# Patient Record
Sex: Female | Born: 1945
Health system: Southern US, Community
[De-identification: ages and names within clinical notes are randomized; demographics above are authoritative.]

## PROBLEM LIST (undated history)

## (undated) DIAGNOSIS — K219 Gastro-esophageal reflux disease without esophagitis: Secondary | ICD-10-CM

## (undated) DIAGNOSIS — K5792 Diverticulitis of intestine, part unspecified, without perforation or abscess without bleeding: Secondary | ICD-10-CM

## (undated) DIAGNOSIS — M1990 Unspecified osteoarthritis, unspecified site: Secondary | ICD-10-CM

## (undated) DIAGNOSIS — R51 Headache: Secondary | ICD-10-CM

## (undated) DIAGNOSIS — E785 Hyperlipidemia, unspecified: Secondary | ICD-10-CM

## (undated) DIAGNOSIS — H35373 Puckering of macula, bilateral: Secondary | ICD-10-CM

## (undated) DIAGNOSIS — E039 Hypothyroidism, unspecified: Secondary | ICD-10-CM

## (undated) DIAGNOSIS — N841 Polyp of cervix uteri: Secondary | ICD-10-CM

## (undated) DIAGNOSIS — G43909 Migraine, unspecified, not intractable, without status migrainosus: Secondary | ICD-10-CM

## (undated) DIAGNOSIS — R519 Headache, unspecified: Secondary | ICD-10-CM

## (undated) DIAGNOSIS — G510 Bell's palsy: Secondary | ICD-10-CM

## (undated) DIAGNOSIS — N390 Urinary tract infection, site not specified: Secondary | ICD-10-CM

## (undated) HISTORY — DX: Bell's palsy: G51.0

## (undated) HISTORY — DX: Headache: R51

## (undated) HISTORY — PX: DILATION AND CURETTAGE OF UTERUS: SHX78

## (undated) HISTORY — DX: Puckering of macula, bilateral: H35.373

## (undated) HISTORY — DX: Gastro-esophageal reflux disease without esophagitis: K21.9

## (undated) HISTORY — DX: Hypothyroidism, unspecified: E03.9

## (undated) HISTORY — DX: Headache, unspecified: R51.9

## (undated) HISTORY — DX: Urinary tract infection, site not specified: N39.0

## (undated) HISTORY — PX: BLEPHAROPLASTY: SUR158

## (undated) HISTORY — PX: UPPER ENDOSCOPY W/ ANTRODUODENAL MANOMETRY: SHX2602

## (undated) HISTORY — DX: Unspecified osteoarthritis, unspecified site: M19.90

## (undated) HISTORY — PX: COLONOSCOPY: SHX174

## (undated) HISTORY — DX: Migraine, unspecified, not intractable, without status migrainosus: G43.909

## (undated) HISTORY — DX: Polyp of cervix uteri: N84.1

## (undated) HISTORY — DX: Hyperlipidemia, unspecified: E78.5

## (undated) HISTORY — DX: Diverticulitis of intestine, part unspecified, without perforation or abscess without bleeding: K57.92

---

## 1993-05-19 HISTORY — PX: BREAST EXCISIONAL BIOPSY: SUR124

## 1993-09-18 HISTORY — PX: BREAST EXCISIONAL BIOPSY: SUR124

## 2006-10-19 HISTORY — PX: CATARACT EXTRACTION: SUR2

## 2012-10-19 HISTORY — PX: CERVICAL POLYPECTOMY: SHX88

## 2012-10-19 HISTORY — PX: HYSTEROSCOPY: SHX211

## 2014-03-08 LAB — HM DEXA SCAN

## 2015-10-22 DIAGNOSIS — Z1159 Encounter for screening for other viral diseases: Secondary | ICD-10-CM | POA: Diagnosis not present

## 2015-10-22 DIAGNOSIS — I10 Essential (primary) hypertension: Secondary | ICD-10-CM | POA: Diagnosis not present

## 2015-10-29 DIAGNOSIS — Z23 Encounter for immunization: Secondary | ICD-10-CM | POA: Diagnosis not present

## 2015-10-29 DIAGNOSIS — Z0000 Encounter for general adult medical examination without abnormal findings: Secondary | ICD-10-CM | POA: Diagnosis not present

## 2015-11-06 DIAGNOSIS — H04123 Dry eye syndrome of bilateral lacrimal glands: Secondary | ICD-10-CM | POA: Diagnosis not present

## 2015-11-06 DIAGNOSIS — H43812 Vitreous degeneration, left eye: Secondary | ICD-10-CM | POA: Diagnosis not present

## 2015-11-06 DIAGNOSIS — H35352 Cystoid macular degeneration, left eye: Secondary | ICD-10-CM | POA: Diagnosis not present

## 2015-11-06 DIAGNOSIS — H35372 Puckering of macula, left eye: Secondary | ICD-10-CM | POA: Diagnosis not present

## 2015-12-02 DIAGNOSIS — K219 Gastro-esophageal reflux disease without esophagitis: Secondary | ICD-10-CM | POA: Diagnosis not present

## 2015-12-03 DIAGNOSIS — L570 Actinic keratosis: Secondary | ICD-10-CM | POA: Diagnosis not present

## 2015-12-03 DIAGNOSIS — L820 Inflamed seborrheic keratosis: Secondary | ICD-10-CM | POA: Diagnosis not present

## 2016-01-03 DIAGNOSIS — H35413 Lattice degeneration of retina, bilateral: Secondary | ICD-10-CM | POA: Diagnosis not present

## 2016-01-03 DIAGNOSIS — H35411 Lattice degeneration of retina, right eye: Secondary | ICD-10-CM | POA: Diagnosis not present

## 2016-01-03 DIAGNOSIS — H43811 Vitreous degeneration, right eye: Secondary | ICD-10-CM | POA: Diagnosis not present

## 2016-01-03 DIAGNOSIS — H43822 Vitreomacular adhesion, left eye: Secondary | ICD-10-CM | POA: Diagnosis not present

## 2016-01-03 DIAGNOSIS — H43812 Vitreous degeneration, left eye: Secondary | ICD-10-CM | POA: Diagnosis not present

## 2016-01-03 DIAGNOSIS — H43813 Vitreous degeneration, bilateral: Secondary | ICD-10-CM | POA: Diagnosis not present

## 2016-01-03 DIAGNOSIS — H35412 Lattice degeneration of retina, left eye: Secondary | ICD-10-CM | POA: Diagnosis not present

## 2016-02-21 DIAGNOSIS — Z1151 Encounter for screening for human papillomavirus (HPV): Secondary | ICD-10-CM | POA: Diagnosis not present

## 2016-02-21 DIAGNOSIS — Z124 Encounter for screening for malignant neoplasm of cervix: Secondary | ICD-10-CM | POA: Diagnosis not present

## 2016-02-21 LAB — HM PAP SMEAR: HM Pap smear: NORMAL

## 2016-03-05 DIAGNOSIS — L570 Actinic keratosis: Secondary | ICD-10-CM | POA: Diagnosis not present

## 2016-03-05 DIAGNOSIS — L821 Other seborrheic keratosis: Secondary | ICD-10-CM | POA: Diagnosis not present

## 2016-03-05 DIAGNOSIS — L853 Xerosis cutis: Secondary | ICD-10-CM | POA: Diagnosis not present

## 2016-03-05 DIAGNOSIS — D225 Melanocytic nevi of trunk: Secondary | ICD-10-CM | POA: Diagnosis not present

## 2016-03-05 DIAGNOSIS — D235 Other benign neoplasm of skin of trunk: Secondary | ICD-10-CM | POA: Diagnosis not present

## 2016-04-07 DIAGNOSIS — Z1231 Encounter for screening mammogram for malignant neoplasm of breast: Secondary | ICD-10-CM | POA: Diagnosis not present

## 2016-08-27 DIAGNOSIS — Z23 Encounter for immunization: Secondary | ICD-10-CM | POA: Diagnosis not present

## 2016-09-18 DIAGNOSIS — Z961 Presence of intraocular lens: Secondary | ICD-10-CM | POA: Diagnosis not present

## 2016-09-18 DIAGNOSIS — H35413 Lattice degeneration of retina, bilateral: Secondary | ICD-10-CM | POA: Diagnosis not present

## 2016-09-18 DIAGNOSIS — H04123 Dry eye syndrome of bilateral lacrimal glands: Secondary | ICD-10-CM | POA: Diagnosis not present

## 2016-09-18 DIAGNOSIS — H35372 Puckering of macula, left eye: Secondary | ICD-10-CM | POA: Diagnosis not present

## 2016-10-23 DIAGNOSIS — I10 Essential (primary) hypertension: Secondary | ICD-10-CM | POA: Diagnosis not present

## 2016-10-30 DIAGNOSIS — Z0000 Encounter for general adult medical examination without abnormal findings: Secondary | ICD-10-CM | POA: Diagnosis not present

## 2016-12-25 DIAGNOSIS — L02211 Cutaneous abscess of abdominal wall: Secondary | ICD-10-CM | POA: Diagnosis not present

## 2017-01-05 DIAGNOSIS — H43813 Vitreous degeneration, bilateral: Secondary | ICD-10-CM | POA: Diagnosis not present

## 2017-01-05 DIAGNOSIS — H35412 Lattice degeneration of retina, left eye: Secondary | ICD-10-CM | POA: Diagnosis not present

## 2017-01-05 DIAGNOSIS — H43811 Vitreous degeneration, right eye: Secondary | ICD-10-CM | POA: Diagnosis not present

## 2017-01-05 DIAGNOSIS — H43812 Vitreous degeneration, left eye: Secondary | ICD-10-CM | POA: Diagnosis not present

## 2017-01-05 DIAGNOSIS — H43822 Vitreomacular adhesion, left eye: Secondary | ICD-10-CM | POA: Diagnosis not present

## 2017-01-21 DIAGNOSIS — E039 Hypothyroidism, unspecified: Secondary | ICD-10-CM | POA: Diagnosis not present

## 2017-01-26 DIAGNOSIS — K625 Hemorrhage of anus and rectum: Secondary | ICD-10-CM | POA: Diagnosis not present

## 2017-01-26 DIAGNOSIS — K219 Gastro-esophageal reflux disease without esophagitis: Secondary | ICD-10-CM | POA: Diagnosis not present

## 2017-01-27 DIAGNOSIS — K625 Hemorrhage of anus and rectum: Secondary | ICD-10-CM | POA: Diagnosis not present

## 2017-01-27 DIAGNOSIS — K219 Gastro-esophageal reflux disease without esophagitis: Secondary | ICD-10-CM | POA: Diagnosis not present

## 2017-01-28 DIAGNOSIS — E039 Hypothyroidism, unspecified: Secondary | ICD-10-CM | POA: Diagnosis not present

## 2017-01-28 DIAGNOSIS — L089 Local infection of the skin and subcutaneous tissue, unspecified: Secondary | ICD-10-CM | POA: Diagnosis not present

## 2017-04-15 ENCOUNTER — Other Ambulatory Visit: Payer: Self-pay | Admitting: Internal Medicine

## 2017-04-15 DIAGNOSIS — Z1231 Encounter for screening mammogram for malignant neoplasm of breast: Secondary | ICD-10-CM

## 2017-04-29 ENCOUNTER — Ambulatory Visit
Admission: RE | Admit: 2017-04-29 | Discharge: 2017-04-29 | Disposition: A | Discharge status: Home / Self Care | Payer: Medicare Other | Source: Ambulatory Visit | Attending: Internal Medicine | Admitting: Internal Medicine

## 2017-04-29 ENCOUNTER — Other Ambulatory Visit: Payer: Self-pay | Admitting: *Deleted

## 2017-04-29 ENCOUNTER — Encounter: Payer: Self-pay | Admitting: Radiology

## 2017-04-29 ENCOUNTER — Inpatient Hospital Stay
Admission: RE | Admit: 2017-04-29 | Discharge: 2017-04-29 | Disposition: A | Discharge status: Home / Self Care | Payer: Self-pay | Source: Ambulatory Visit | Attending: *Deleted | Admitting: *Deleted

## 2017-04-29 DIAGNOSIS — Z9289 Personal history of other medical treatment: Secondary | ICD-10-CM

## 2017-04-29 DIAGNOSIS — Z1231 Encounter for screening mammogram for malignant neoplasm of breast: Secondary | ICD-10-CM | POA: Insufficient documentation

## 2017-04-29 LAB — HM MAMMOGRAPHY

## 2017-06-30 ENCOUNTER — Encounter (INDEPENDENT_AMBULATORY_CARE_PROVIDER_SITE_OTHER): Payer: Self-pay

## 2017-06-30 ENCOUNTER — Ambulatory Visit (INDEPENDENT_AMBULATORY_CARE_PROVIDER_SITE_OTHER): Payer: Medicare Other | Admitting: Primary Care

## 2017-06-30 ENCOUNTER — Encounter: Payer: Self-pay | Admitting: Primary Care

## 2017-06-30 VITALS — BP 122/72 | HR 57 | Temp 98.9°F | Ht 59.25 in | Wt 156.8 lb

## 2017-06-30 DIAGNOSIS — R51 Headache: Secondary | ICD-10-CM

## 2017-06-30 DIAGNOSIS — E039 Hypothyroidism, unspecified: Secondary | ICD-10-CM | POA: Insufficient documentation

## 2017-06-30 DIAGNOSIS — E785 Hyperlipidemia, unspecified: Secondary | ICD-10-CM | POA: Diagnosis not present

## 2017-06-30 DIAGNOSIS — R519 Headache, unspecified: Secondary | ICD-10-CM

## 2017-06-30 DIAGNOSIS — F329 Major depressive disorder, single episode, unspecified: Secondary | ICD-10-CM | POA: Diagnosis not present

## 2017-06-30 DIAGNOSIS — F419 Anxiety disorder, unspecified: Secondary | ICD-10-CM

## 2017-06-30 DIAGNOSIS — Z23 Encounter for immunization: Secondary | ICD-10-CM | POA: Diagnosis not present

## 2017-06-30 DIAGNOSIS — K219 Gastro-esophageal reflux disease without esophagitis: Secondary | ICD-10-CM | POA: Insufficient documentation

## 2017-06-30 DIAGNOSIS — Z79899 Other long term (current) drug therapy: Secondary | ICD-10-CM | POA: Diagnosis not present

## 2017-06-30 MED ORDER — OMEPRAZOLE 20 MG PO CPDR: 20.0000 mg | DELAYED_RELEASE_CAPSULE | Freq: Every day | ORAL | 0 refills | Status: DC

## 2017-06-30 NOTE — Assessment & Plan Note (Signed)
Diagnosed years ago, feels well managed on Wellbutrin and citalopram. Discouraged use of diazepam, seems like she uses this on occasion which is appropriate. Urine drug screen and controlled substance contract obtained today. Typically uses a total of 4 pills monthly.

## 2017-06-30 NOTE — Assessment & Plan Note (Signed)
Long history of chronic headaches, overall well managed on Excedrin Migraine. Discussed to notify if headaches become more persistent. Suspect recent headaches from weather changes, allergies.

## 2017-06-30 NOTE — Assessment & Plan Note (Signed)
Endorses stable labs on recent lipid panel. Continue simvastatin 10 kg. Will obtain records for prior labs.

## 2017-06-30 NOTE — Assessment & Plan Note (Signed)
Last TSH in April 2018 was stable on levothyroxine 75 mg. Continue same. Will repeat labs in January 2019 at CPE.

## 2017-06-30 NOTE — Assessment & Plan Note (Signed)
Managed on omeprazole 40 mg for years, never tried a reduction in dose. Will have her reduce to 20 mg to see if this is enough to control symptoms. She will update if not. Last endoscopy completed in 2015, unremarkable except for hiatal hernia.

## 2017-06-30 NOTE — Progress Notes (Signed)
Subjective:    Patient ID: Grace Ortiz, female    DOB: 09/03/1946, 71 y.o.   MRN: [id number]  HPI  Grace Ortiz is a 71 year old female who presents today to establish care and discuss the problems mentioned below. Will obtain old records. Her last CPE was in January 2019.   1) Anxiety and Depression: Diagnosed years ago. Currently managed on bupropion XL 300 mg once daily, Citalopram 40 mg once daily, diazepam 5 mg PRN. Overall she feels well managed. She uses the diazepam once weekly on average for restless legs and for occasional insomnia.  2) Hypothyroidism: Diagnosed in 1998. Currently managed on levothyroxine 75 mcg. Her last thyroid check was April 2018 with normal result. The dose was increased from 75 mcg and 50 mcg on alternating days to 75 mcg daily.   3) GERD: Currently managed on omeprazole 40 mg, she will take this medication everyday. Symptoms include esophageal burning and reflux of gastric contents. Endoscopy and colonoscopy completed 2015, negative endoscopy except for hital hernia, small polyp on colonoscopy. She's never tired a reduction in her dose.  4) Hyperlipidemia: Currently managed on simvastatin 10 mg. Her last lipid panel was normal in January 2018.  5) Frequent Headaches: Diagnosed in her teenage years. Occur once weekly on average. She is managed on compazine 10 mg for which she takes very rarely. Mostly uses excedrin migraine with resolve and headache.  Review of Systems  Eyes: Negative for visual disturbance.  Respiratory: Negative for shortness of breath.   Cardiovascular: Negative for chest pain.  Gastrointestinal:       GERD  Neurological: Positive for headaches.  Psychiatric/Behavioral:       Feels well managed on current regimen       Past Medical History:  Diagnosis Date  . Arthritis   . Diverticulitis   . Frequent headaches   . GERD (gastroesophageal reflux disease)   . Hyperlipidemia   . Migraines   . Thyroid disease      Social  History   Social History  . Marital status: Married    Spouse name: N/A  . Number of children: N/A  . Years of education: N/A   Occupational History  . Not on file.   Social History Main Topics  . Smoking status: Former Research scientist (life sciences)  . Smokeless tobacco: Never Used  . Alcohol use No  . Drug use: Unknown  . Sexual activity: Not on file   Other Topics Concern  . Not on file   Social History Narrative   Married.   No children.   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    Moved from [place].       Past Surgical History:  Procedure Laterality Date  . BREAST EXCISIONAL BIOPSY Left 05/1993   neg  . BREAST EXCISIONAL BIOPSY Left 09/1993   neg  . CATARACT EXTRACTION  2008  . HYSTEROSCOPY  2014    Family History  Problem Relation Age of Onset  . Breast cancer Maternal Grandmother 30  . Cancer Maternal Grandmother        breast  . Early death Maternal Grandmother   . Hearing loss Mother   . Heart disease Mother   . Cancer Father        colon  . Hearing loss Father   . Diabetes Sister   . Early death Paternal Grandfather     No Known Allergies  No current outpatient prescriptions on file prior to visit.   No current facility-administered medications  on file prior to visit.     BP 122/72   Pulse (!) 57   Temp 98.9 F (37.2 C) (Oral)   Ht 4' 11.25" (1.505 m)   Wt 156 lb 12.8 oz (71.1 kg)   SpO2 97%   BMI 31.40 kg/m    Objective:   Physical Exam  Constitutional: She appears well-nourished.  Neck: Neck supple.  Cardiovascular: Normal rate and regular rhythm.   Pulmonary/Chest: Effort normal and breath sounds normal.  Skin: Skin is warm and dry.  Psychiatric: She has a normal mood and affect.          Assessment & Plan:

## 2017-06-30 NOTE — Patient Instructions (Signed)
Stop by the lab for a urine drug screen and to sign the controlled substance contract.   Please message me when you need refills. Allow our office 1 week to process.  Reduce your omeprazole to 20 mg. Please update me in several weeks.   Please schedule a physical with me in January 2019. You may also schedule a lab only appointment 3-4 days prior. We will discuss your lab results in detail during your physical.   It was a pleasure to meet you today! Please don't hesitate to call me with any questions. Welcome to [facility]!

## 2017-07-01 ENCOUNTER — Encounter: Payer: Self-pay | Admitting: Primary Care

## 2017-07-16 ENCOUNTER — Encounter: Payer: Self-pay | Admitting: Primary Care

## 2017-07-17 ENCOUNTER — Other Ambulatory Visit: Payer: Self-pay | Admitting: Primary Care

## 2017-07-19 NOTE — Telephone Encounter (Signed)
Patient is doing well on the 20 mg. Will send refill until see Anda Kraft in 10/2017

## 2017-07-24 ENCOUNTER — Other Ambulatory Visit: Payer: Self-pay | Admitting: Primary Care

## 2017-07-26 ENCOUNTER — Encounter: Payer: Self-pay | Admitting: Primary Care

## 2017-08-02 ENCOUNTER — Telehealth: Payer: Self-pay | Admitting: Primary Care

## 2017-08-02 NOTE — Telephone Encounter (Signed)
Can we get someone to print the records that are on both discs?

## 2017-08-02 NOTE — Telephone Encounter (Signed)
PT dropped off disc of records for her and spouse Grace Ortiz. I placed on the cart. Pt would like a call to p/u when finished.

## 2017-08-06 DIAGNOSIS — H35373 Puckering of macula, bilateral: Secondary | ICD-10-CM | POA: Diagnosis not present

## 2017-08-06 NOTE — Telephone Encounter (Signed)
I checked with Junie Panning and Terri. Apparently we do not have the ability at our site to print records from disc. I will call patient to pick up discs.

## 2017-08-06 NOTE — Telephone Encounter (Signed)
I've checked with

## 2017-08-06 NOTE — Telephone Encounter (Signed)
Please ask patient if she can take them by [facility] or else where to have them printed out. I'd like a copy if possible.

## 2017-08-11 NOTE — Telephone Encounter (Signed)
I spoke with patient and she will pick up and check on printing copies.

## 2017-09-05 ENCOUNTER — Encounter: Payer: Self-pay | Admitting: Primary Care

## 2017-10-14 DIAGNOSIS — D2271 Melanocytic nevi of right lower limb, including hip: Secondary | ICD-10-CM | POA: Diagnosis not present

## 2017-10-14 DIAGNOSIS — D2272 Melanocytic nevi of left lower limb, including hip: Secondary | ICD-10-CM | POA: Diagnosis not present

## 2017-10-14 DIAGNOSIS — D225 Melanocytic nevi of trunk: Secondary | ICD-10-CM | POA: Diagnosis not present

## 2017-10-14 DIAGNOSIS — L700 Acne vulgaris: Secondary | ICD-10-CM | POA: Diagnosis not present

## 2017-10-22 ENCOUNTER — Other Ambulatory Visit: Payer: Self-pay | Admitting: Primary Care

## 2017-10-22 DIAGNOSIS — E785 Hyperlipidemia, unspecified: Secondary | ICD-10-CM

## 2017-10-22 DIAGNOSIS — E039 Hypothyroidism, unspecified: Secondary | ICD-10-CM

## 2017-10-22 DIAGNOSIS — Z1159 Encounter for screening for other viral diseases: Secondary | ICD-10-CM

## 2017-10-28 ENCOUNTER — Ambulatory Visit (INDEPENDENT_AMBULATORY_CARE_PROVIDER_SITE_OTHER): Payer: Medicare Other

## 2017-10-28 VITALS — BP 124/80 | HR 62 | Temp 98.5°F | Ht 59.0 in | Wt 156.5 lb

## 2017-10-28 DIAGNOSIS — E039 Hypothyroidism, unspecified: Secondary | ICD-10-CM

## 2017-10-28 DIAGNOSIS — Z0000 Encounter for general adult medical examination without abnormal findings: Secondary | ICD-10-CM | POA: Diagnosis not present

## 2017-10-28 DIAGNOSIS — E785 Hyperlipidemia, unspecified: Secondary | ICD-10-CM

## 2017-10-28 LAB — LIPID PANEL
CHOL/HDL RATIO: 3
Cholesterol: 151 mg/dL (ref 0–200)
HDL: 60.3 mg/dL (ref >39.00)
LDL Cholesterol: 72 mg/dL (ref 0–99)
NONHDL: 90.8
Triglycerides: 92 mg/dL (ref 0.0–149.0)
VLDL: 18.4 mg/dL (ref 0.0–40.0)

## 2017-10-28 LAB — COMPREHENSIVE METABOLIC PANEL
ALT: 18 U/L (ref 0–35)
AST: 18 U/L (ref 0–37)
Albumin: 4 g/dL (ref 3.5–5.2)
Alkaline Phosphatase: 68 U/L (ref 39–117)
BUN: 15 mg/dL (ref 6–23)
CO2: 30 meq/L (ref 19–32)
Calcium: 9.7 mg/dL (ref 8.4–10.5)
Chloride: 106 mEq/L (ref 96–112)
Creatinine, Ser: 1.02 mg/dL (ref 0.40–1.20)
GFR: 56.71 mL/min — AB (ref >60.00)
GLUCOSE: 93 mg/dL (ref 70–99)
POTASSIUM: 4.2 meq/L (ref 3.5–5.1)
SODIUM: 142 meq/L (ref 135–145)
TOTAL PROTEIN: 6.9 g/dL (ref 6.0–8.3)
Total Bilirubin: 0.5 mg/dL (ref 0.2–1.2)

## 2017-10-28 LAB — TSH: TSH: 7.17 u[IU]/mL — ABNORMAL HIGH (ref 0.35–4.50)

## 2017-10-28 NOTE — Patient Instructions (Signed)
Grace Ortiz , Thank you for taking time to come for your Medicare Wellness Visit. I appreciate your ongoing commitment to your health goals. Please review the following plan we discussed and let me know if I can assist you in the future.   These are the goals we discussed: Goals    . Increase physical activity     When weather permits, I attempt to walk at least 30 minutes 3 days per week.        This is a list of the screening recommended for you and due dates:  Health Maintenance  Topic Date Due  . Mammogram  04/30/2019  . Colon Cancer Screening  05/21/2024  . Tetanus Vaccine  06/01/2026  . Flu Shot  Completed  . DEXA scan (bone density measurement)  Completed  .  Hepatitis C: One time screening is recommended by Center for Disease Control  (CDC) for  adults born from [birth year] through 1965.   Completed  . Pneumonia vaccines  Completed   Preventive Care for Adults  A healthy lifestyle and preventive care can promote health and wellness. Preventive health guidelines for adults include the following key practices.  . A routine yearly physical is a good way to check with your health care provider about your health and preventive screening. It is a chance to share any concerns and updates on your health and to receive a thorough exam.  . Visit your dentist for a routine exam and preventive care every 6 months. Brush your teeth twice a day and floss once a day. Good oral hygiene prevents tooth decay and gum disease.  . The frequency of eye exams is based on your age, health, family medical history, use  of contact lenses, and other factors. Follow your health care provider's recommendations for frequency of eye exams.  . Eat a healthy diet. Foods like vegetables, fruits, whole grains, low-fat dairy products, and lean protein foods contain the nutrients you need without too many calories. Decrease your intake of foods high in solid fats, added sugars, and salt. Eat the right amount of  calories for you. Get information about a proper diet from your health care provider, if necessary.  . Regular physical exercise is one of the most important things you can do for your health. Most adults should get at least 150 minutes of moderate-intensity exercise (any activity that increases your heart rate and causes you to sweat) each week. In addition, most adults need muscle-strengthening exercises on 2 or more days a week.  Silver Sneakers may be a benefit available to you. To determine eligibility, you may visit the website: www.silversneakers.com or contact program at (303)060-5325 Mon-Fri between 8AM-8PM.   . Maintain a healthy weight. The body mass index (BMI) is a screening tool to identify possible weight problems. It provides an estimate of body fat based on height and weight. Your health care provider can find your BMI and can help you achieve or maintain a healthy weight.   For adults 20 years and older: ? A BMI below 18.5 is considered underweight. ? A BMI of 18.5 to 24.9 is normal. ? A BMI of 25 to 29.9 is considered overweight. ? A BMI of 30 and above is considered obese.   . Maintain normal blood lipids and cholesterol levels by exercising and minimizing your intake of saturated fat. Eat a balanced diet with plenty of fruit and vegetables. Blood tests for lipids and cholesterol should begin at age 3 and be repeated every 5  years. If your lipid or cholesterol levels are high, you are over 50, or you are at high risk for heart disease, you may need your cholesterol levels checked more frequently. Ongoing high lipid and cholesterol levels should be treated with medicines if diet and exercise are not working.  . If you smoke, find out from your health care provider how to quit. If you do not use tobacco, please do not start.  . If you choose to drink alcohol, please do not consume more than 2 drinks per day. One drink is considered to be 12 ounces (355 mL) of beer, 5 ounces  (148 mL) of wine, or 1.5 ounces (44 mL) of liquor.  . If you are 22-17 years old, ask your health care provider if you should take aspirin to prevent strokes.  . Use sunscreen. Apply sunscreen liberally and repeatedly throughout the day. You should seek shade when your shadow is shorter than you. Protect yourself by wearing long sleeves, pants, a wide-brimmed hat, and sunglasses year round, whenever you are outdoors.  . Once a month, do a whole body skin exam, using a mirror to look at the skin on your back. Tell your health care provider of new moles, moles that have irregular borders, moles that are larger than a pencil eraser, or moles that have changed in shape or color.

## 2017-10-29 ENCOUNTER — Encounter: Payer: Self-pay | Admitting: Primary Care

## 2017-10-29 ENCOUNTER — Ambulatory Visit (INDEPENDENT_AMBULATORY_CARE_PROVIDER_SITE_OTHER): Payer: Medicare Other | Admitting: Primary Care

## 2017-10-29 ENCOUNTER — Telehealth: Payer: Self-pay | Admitting: Primary Care

## 2017-10-29 VITALS — BP 124/80 | HR 62 | Temp 98.5°F | Ht 59.0 in | Wt 156.8 lb

## 2017-10-29 DIAGNOSIS — R51 Headache: Secondary | ICD-10-CM

## 2017-10-29 DIAGNOSIS — K219 Gastro-esophageal reflux disease without esophagitis: Secondary | ICD-10-CM | POA: Diagnosis not present

## 2017-10-29 DIAGNOSIS — F329 Major depressive disorder, single episode, unspecified: Secondary | ICD-10-CM

## 2017-10-29 DIAGNOSIS — E039 Hypothyroidism, unspecified: Secondary | ICD-10-CM | POA: Diagnosis not present

## 2017-10-29 DIAGNOSIS — F419 Anxiety disorder, unspecified: Secondary | ICD-10-CM

## 2017-10-29 DIAGNOSIS — E785 Hyperlipidemia, unspecified: Secondary | ICD-10-CM

## 2017-10-29 DIAGNOSIS — R519 Headache, unspecified: Secondary | ICD-10-CM

## 2017-10-29 DIAGNOSIS — E2839 Other primary ovarian failure: Secondary | ICD-10-CM | POA: Diagnosis not present

## 2017-10-29 DIAGNOSIS — F32A Depression, unspecified: Secondary | ICD-10-CM

## 2017-10-29 MED ORDER — OMEPRAZOLE 20 MG PO CPDR: 20.0000 mg | DELAYED_RELEASE_CAPSULE | Freq: Every day | ORAL | 3 refills | Status: DC

## 2017-10-29 MED ORDER — CITALOPRAM HYDROBROMIDE 40 MG PO TABS: 40.0000 mg | ORAL_TABLET | Freq: Every day | ORAL | 3 refills | Status: DC

## 2017-10-29 MED ORDER — SIMVASTATIN 10 MG PO TABS: 10.0000 mg | ORAL_TABLET | Freq: Every evening | ORAL | 3 refills | Status: DC

## 2017-10-29 MED ORDER — BUPROPION HCL ER (XL) 300 MG PO TB24: 300.0000 mg | ORAL_TABLET | Freq: Every day | ORAL | 3 refills | Status: DC

## 2017-10-29 MED ORDER — LEVOTHYROXINE SODIUM 88 MCG PO TABS: 88.0000 ug | ORAL_TABLET | Freq: Every day | ORAL | 0 refills | Status: DC

## 2017-10-29 MED ORDER — DIAZEPAM 5 MG PO TABS: 5.0000 mg | ORAL_TABLET | Freq: Every evening | ORAL | 0 refills | Status: DC | PRN

## 2017-10-29 MED ORDER — PROCHLORPERAZINE MALEATE 10 MG PO TABS: 10.0000 mg | ORAL_TABLET | Freq: Four times a day (QID) | ORAL | 0 refills | Status: DC | PRN

## 2017-10-29 NOTE — Assessment & Plan Note (Signed)
Feels well managed on current regimen. Using diazepam sparingly, about 1-2 times weekly on average. Will refill according to current use. Encouraged to limit to 10 pills per month.

## 2017-10-29 NOTE — Assessment & Plan Note (Signed)
Stable on omeprazole 20 mg, continue same. Bone density scan pending.

## 2017-10-29 NOTE — Assessment & Plan Note (Signed)
Recent TSH over 7. Given symptoms of fatigue, will increase dose of levothyroxine from 75 mcg to 88 mcg. Will recheck TSH in 6 weeks.

## 2017-10-29 NOTE — Patient Instructions (Signed)
We've increased your levothyroxine dose to 88 mcg. Try taking with a full glass of water rather than orange juice. Do not eat within 30-45 minutes after taking.  Schedule a lab only appointment in 6 weeks to repeat your thyroid function.  Start exercising. You should be getting 150 minutes of moderate intensity exercise weekly.  Increase vegetables, fruit, whole grains.  Ensure you are consuming 64 ounces of water daily.  Call the [facility] breast center to schedule your bone density scan.  Follow up in 1 year for your annual exam or sooner if needed.  It was a pleasure to see you today!

## 2017-10-29 NOTE — Assessment & Plan Note (Signed)
Continue, mostly sinus headaches. Discussed OTC treatment.

## 2017-10-29 NOTE — Progress Notes (Signed)
Subjective:    Patient ID: Grace Ortiz, female    DOB: Jul 14, 1946, 72 y.o.   MRN: [id number]  HPI  Grace Ortiz is a 72 year old female who presents today for [place] Part 2. She met with our health advisor yesterday.   Immunizations: -Tetanus: Completed in 2017 -Influenza: Completed this season -Pneumonia: Completed Prevnar in 2015, Pneumovax in 2017 -Shingles: Completed in 2012  Diet: She endorses a fair diet Breakfast: Juice, coffee Lunch: Sandwich, chips Dinner: Meat, vegetable, sandwich, frozen dinner Snacks: Jello with fruit Desserts: Daily Beverages: Coffee, Juice  Colonoscopy: Completed in 2015 Dexa: Due. Mammogram: Completed in July 2019 Hep C Screen: Completed, negative   1) Hypothyroidism: Currently managed on levothyroxine 75 mcg. Her levothyroxine dose was increased one year ago. She was once on 88 mcg in the past. She's noticed feeling sluggish and fatigued, especially after getting a full night's sleep. Recent TSH level of 7.17. She denies large amounts of hair loss.   2) Hyperlipidemia: Currently managed on simvastatin 10 mg. Recent lipid panel with TC of 151, LDL of 72. She denies myalgias.   3) Anxiety and Depression: Currently managed on bupropion XL 300 mg, citalopram 40 mg, and diazepam 5 mg PRN. She is using her diazepam once to twice weekly on average. Overall she feels well managed. She denies SI/HI.  Review of Systems  Constitutional: Positive for fatigue. Negative for unexpected weight change.  HENT: Negative for rhinorrhea.   Respiratory: Negative for cough and shortness of breath.   Cardiovascular: Negative for chest pain.  Gastrointestinal: Negative for constipation and diarrhea.  Genitourinary: Negative for difficulty urinating and menstrual problem.  Musculoskeletal: Negative for arthralgias and myalgias.  Skin: Negative for rash.  Allergic/Immunologic: Negative for environmental allergies.  Neurological: Negative for dizziness, numbness and  headaches.  Psychiatric/Behavioral:       Feels well managed on current regimen.       Past Medical History:  Diagnosis Date  . Arthritis   . Bell's palsy   . Cervical polyp   . Diverticulitis   . Frequent headaches   . GERD (gastroesophageal reflux disease)   . Hyperlipidemia   . Hypothyroidism   . Macular retinal puckering, bilateral   . Migraines      Social History   Socioeconomic History  . Marital status: Married    Spouse name: Not on file  . Number of children: Not on file  . Years of education: Not on file  . Highest education level: Not on file  Social Needs  . Financial resource strain: Not on file  . Food insecurity - worry: Not on file  . Food insecurity - inability: Not on file  . Transportation needs - medical: Not on file  . Transportation needs - non-medical: Not on file  Occupational History  . Not on file  Tobacco Use  . Smoking status: Former Research scientist (life sciences)  . Smokeless tobacco: Never Used  Substance and Sexual Activity  . Alcohol use: No  . Drug use: No  . Sexual activity: Not on file  Other Topics Concern  . Not on file  Social History Narrative   Married.   No children.   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    Moved from [place].    Past Surgical History:  Procedure Laterality Date  . BLEPHAROPLASTY  2008, 2009  . BREAST EXCISIONAL BIOPSY Left 05/1993   neg  . BREAST EXCISIONAL BIOPSY Left 09/1993   neg  . CATARACT EXTRACTION  2008  .  CERVICAL POLYPECTOMY  2014  . HYSTEROSCOPY  2014    Family History  Problem Relation Age of Onset  . Breast cancer Maternal Grandmother 30  . Cancer Maternal Grandmother        breast  . Early death Maternal Grandmother   . Hearing loss Mother   . Heart disease Mother   . Cancer Father        colon  . Hearing loss Father   . Diabetes Sister   . Early death Paternal Grandfather     No Known Allergies  Current Outpatient Medications on File Prior to Visit  Medication Sig Dispense  Refill  . Ascorbic Acid (VITAMIN C PO) Take 1,000 Units by mouth daily.    Marland Kitchen aspirin EC 81 MG tablet Take 81 mg by mouth daily.    Marland Kitchen buPROPion (WELLBUTRIN XL) 300 MG 24 hr tablet Take 300 mg by mouth daily.     . Cholecalciferol (VITAMIN D3 PO) Take 1,200 mg by mouth daily.    . citalopram (CELEXA) 40 MG tablet Take 40 mg by mouth daily.     . Cyanocobalamin (VITAMIN B12 PO) Take 2,500 mcg by mouth daily.    . diazepam (VALIUM) 5 MG tablet Take 5-10 mg by mouth at bedtime as needed for anxiety.    . Omega-3 Fatty Acids (FISH OIL PO) Take 360 mg by mouth daily.    Marland Kitchen omeprazole (PRILOSEC) 20 MG capsule TAKE ONE CAPSULE BY MOUTH DAILY 90 capsule 0  . polyethylene glycol powder (GLYCOLAX/MIRALAX) powder Take by mouth once.    . prochlorperazine (COMPAZINE) 10 MG tablet Take 10 mg by mouth every 6 (six) hours as needed for nausea or vomiting.    . simvastatin (ZOCOR) 10 MG tablet Take 10 mg by mouth daily at 6 PM.     . Tretinoin Microsphere (RETIN-A MICRO PUMP) 0.06 % GEL      No current facility-administered medications on file prior to visit.     BP 124/80   Pulse 62   Temp 98.5 F (36.9 C) (Oral)   Ht _0  (1.499 m)   Wt 156 lb 12.8 oz (71.1 kg)   SpO2 98%   BMI 31.67 kg/m    Objective:   Physical Exam  Constitutional: She is oriented to person, place, and time. She appears well-nourished.  HENT:  Right Ear: Tympanic membrane and ear canal normal.  Left Ear: Tympanic membrane and ear canal normal.  Nose: Nose normal.  Mouth/Throat: Oropharynx is clear and moist.  Eyes: Conjunctivae and EOM are normal. Pupils are equal, round, and reactive to light.  Neck: Neck supple. No thyromegaly present.  Cardiovascular: Normal rate and regular rhythm.  No murmur heard. Pulmonary/Chest: Effort normal and breath sounds normal. She has no rales. Right breast exhibits no mass, no skin change and no tenderness. Left breast exhibits no mass, no skin change and no tenderness.  Abdominal:  Soft. Bowel sounds are normal. There is no tenderness.  Genitourinary: There is no tenderness or lesion on the right labia. There is no tenderness or lesion on the left labia. Cervix exhibits no motion tenderness and no discharge. Right adnexum displays no tenderness. Left adnexum displays no tenderness. No vaginal discharge found.  Musculoskeletal: Normal range of motion.  Lymphadenopathy:    She has no cervical adenopathy.  Neurological: She is alert and oriented to person, place, and time. She has normal reflexes. No cranial nerve deficit.  Skin: Skin is warm and dry. No rash noted.  Psychiatric:  She has a normal mood and affect.          Assessment & Plan:

## 2017-10-29 NOTE — Telephone Encounter (Signed)
Will you please call in diazepam 5 mg. Take 1 tablet by mouth once daily as needed for anxiety. Use sparingly. #30, no refills.

## 2017-10-29 NOTE — Assessment & Plan Note (Signed)
Stable on Simvastatin 10 mg, continue same.

## 2017-10-31 NOTE — Progress Notes (Signed)
Subjective:   Grace Ortiz is a 72 y.o. female who presents for an Initial Medicare Annual Wellness Visit.  Review of Systems    N/A  Cardiac Risk Factors include: advanced age (>103men, >41 women);obesity (BMI >30kg/m2);dyslipidemia     Objective:    Today's Vitals   10/28/17 1323  BP: 124/80  Pulse: 62  Temp: 98.5 F (36.9 C)  TempSrc: Oral  SpO2: 98%  Weight: 156 lb 8 oz (71 kg)  Height: 4\' 11"  (1.499 m)  PainSc: 0-No pain   Body mass index is 31.61 kg/m.  Advanced Directives 10/28/2017  Does Patient Have a Medical Advance Directive? Yes  Type of Paramedic of [place];Living will  Copy of Equality in Chart? No - copy requested    Current Medications (verified) Outpatient Encounter Medications as of 10/28/2017  Medication Sig  . Ascorbic Acid (VITAMIN C PO) Take 1,000 Units by mouth daily.  Marland Kitchen aspirin EC 81 MG tablet Take 81 mg by mouth daily.  . Cholecalciferol (VITAMIN D3 PO) Take 1,200 mg by mouth daily.  . Cyanocobalamin (VITAMIN B12 PO) Take 2,500 mcg by mouth daily.  . Omega-3 Fatty Acids (FISH OIL PO) Take 360 mg by mouth daily.  . polyethylene glycol powder (GLYCOLAX/MIRALAX) powder Take by mouth once.  . Tretinoin Microsphere (RETIN-A MICRO PUMP) 0.06 % GEL   . [DISCONTINUED] buPROPion (WELLBUTRIN XL) 300 MG 24 hr tablet Take 300 mg by mouth daily.   . [DISCONTINUED] citalopram (CELEXA) 40 MG tablet Take 40 mg by mouth daily.   . [DISCONTINUED] diazepam (VALIUM) 5 MG tablet Take 5-10 mg by mouth at bedtime as needed for anxiety.  . [DISCONTINUED] levothyroxine (SYNTHROID, LEVOTHROID) 75 MCG tablet Take 75 mcg by mouth daily before breakfast.   . [DISCONTINUED] omeprazole (PRILOSEC) 20 MG capsule TAKE ONE CAPSULE BY MOUTH DAILY  . [DISCONTINUED] prochlorperazine (COMPAZINE) 10 MG tablet Take 10 mg by mouth every 6 (six) hours as needed for nausea or vomiting.  . [DISCONTINUED] simvastatin (ZOCOR) 10 MG tablet Take  10 mg by mouth daily at 6 PM.    No facility-administered encounter medications on file as of 10/28/2017.     Allergies (verified) Patient has no known allergies.   History: Past Medical History:  Diagnosis Date  . Arthritis   . Bell's palsy   . Cervical polyp   . Diverticulitis   . Frequent headaches   . GERD (gastroesophageal reflux disease)   . Hyperlipidemia   . Hypothyroidism   . Macular retinal puckering, bilateral   . Migraines    Past Surgical History:  Procedure Laterality Date  . BLEPHAROPLASTY  2008, 2009  . BREAST EXCISIONAL BIOPSY Left 05/1993   neg  . BREAST EXCISIONAL BIOPSY Left 09/1993   neg  . CATARACT EXTRACTION  2008  . CERVICAL POLYPECTOMY  2014  . HYSTEROSCOPY  2014   Family History  Problem Relation Age of Onset  . Breast cancer Maternal Grandmother 30  . Cancer Maternal Grandmother        breast  . Early death Maternal Grandmother   . Hearing loss Mother   . Heart disease Mother   . Cancer Father        colon  . Hearing loss Father   . Diabetes Sister   . Early death Paternal Grandfather    Social History   Socioeconomic History  . Marital status: Married    Spouse name: None  . Number of children: None  . Years  of education: None  . Highest education level: None  Social Needs  . Financial resource strain: None  . Food insecurity - worry: None  . Food insecurity - inability: None  . Transportation needs - medical: None  . Transportation needs - non-medical: None  Occupational History  . None  Tobacco Use  . Smoking status: Former Research scientist (life sciences)  . Smokeless tobacco: Never Used  Substance and Sexual Activity  . Alcohol use: No  . Drug use: No  . Sexual activity: None  Other Topics Concern  . None  Social History Narrative   Married.   No children.   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    Moved from [place].    Tobacco Counseling Counseling given: No   Clinical Intake:  Pre-visit preparation completed:  Yes  Pain : No/denies pain Pain Score: 0-No pain     Nutritional Status: BMI > 30  Obese Nutritional Risks: None Diabetes: No  How often do you need to have someone help you when you read instructions, pamphlets, or other written materials from your doctor or pharmacy?: 1 - Never What is the last grade level you completed in school?: 12th grade + 9yr college  Interpreter Needed?: No  Comments: pt lives with spouse Information entered by :: LPinson, LPN   Activities of Daily Living In your present state of health, do you have any difficulty performing the following activities: 10/31/2017  Hearing? Y  Vision? N  Difficulty concentrating or making decisions? N  Walking or climbing stairs? N  Dressing or bathing? N  Doing errands, shopping? N  Preparing Food and eating ? N  Using the Toilet? N  In the past six months, have you accidently leaked urine? N  Do you have problems with loss of bowel control? N  Managing your Medications? N  Managing your Finances? N  Housekeeping or managing your Housekeeping? N  Some recent data might be hidden     Immunizations and Health Maintenance Immunization History  Administered Date(s) Administered  . Influenza,inj,Quad PF,6+ Mos 06/30/2017  . Pneumococcal Conjugate-13 06/30/2012, 10/25/2013  . Pneumococcal Polysaccharide-23 07/27/2016  . Tdap 06/01/2016  . Zoster 02/10/2011   There are no preventive care reminders to display for this patient.  Patient Care Team: Pleas Koch, NP as PCP - General (Internal Medicine) Leandrew Koyanagi, MD as Referring Physician (Ophthalmology)  Indicate any recent Medical Services you may have received from other than [facility] providers in the past year (date may be approximate).     Assessment:   This is a routine wellness examination for [place].  Hearing/Vision screen Hearing Screening Comments: Bilateral hearing aids Vision Screening Comments: Last vision exam in Sept 2018 with Dr.  Wallace Going  Dietary issues and exercise activities discussed: Current Exercise Habits: The patient does not participate in regular exercise at present, Exercise limited by: None identified  Goals    . Increase physical activity     When weather permits, I attempt to walk at least 30 minutes 3 days per week.       Depression Screen PHQ 2/9 Scores 10/28/2017  PHQ - 2 Score 0  PHQ- 9 Score 3    Fall Risk Fall Risk  10/28/2017  Falls in the past year? No     Cognitive Function: MMSE - Mini Mental State Exam 10/28/2017  Orientation to time 5  Orientation to Place 5  Registration 3  Attention/ Calculation 0  Recall 3  Language- name 2 objects 0  Language- repeat 1  Language- follow 3 step command 3  Language- read & follow direction 0  Write a sentence 0  Copy design 0  Total score 20     PLEASE NOTE: A Mini-Cog screen was completed. Maximum score is 20. A value of 0 denotes this part of [place] MMSE was not completed or the patient failed this part of the Mini-Cog screening.   Mini-Cog Screening Orientation to Time - Max 5 pts Orientation to Place - Max 5 pts Registration - Max 3 pts Recall - Max 3 pts Language Repeat - Max 1 pts Language Follow 3 Step Command - Max 3 pts     Screening Tests Health Maintenance  Topic Date Due  . MAMMOGRAM  04/30/2019  . COLONOSCOPY  05/21/2024  . TETANUS/TDAP  06/01/2026  . INFLUENZA VACCINE  Completed  . DEXA SCAN  Completed  . Hepatitis C Screening  Completed  . PNA vac Low Risk Adult  Completed      Plan:     I have personally reviewed, addressed, and noted the following in the patient's chart:  A. Medical and social history B. Use of alcohol, tobacco or illicit drugs  C. Current medications and supplements D. Functional ability and status E.  Nutritional status F.  Physical activity G. Advance directives H. List of other physicians I.  Hospitalizations, surgeries, and ER visits in previous 12 months J.   [place] to include hearing, vision, cognitive, depression L. Referrals and appointments - none  In addition, I have reviewed and discussed with patient certain preventive protocols, quality metrics, and best practice recommendations. A written personalized care plan for preventive services as well as general preventive health recommendations were provided to patient.  See attached scanned questionnaire for additional information.   Signed,   Lindell Noe, MHA, BS, LPN Health Coach

## 2017-10-31 NOTE — Progress Notes (Signed)
Pre visit review using our clinic review tool, if applicable. No additional management support is needed unless otherwise documented below in the visit note. 

## 2017-10-31 NOTE — Progress Notes (Signed)
PCP notes:   Health maintenance:  Hep C screening - per pt, lab test in 2017   Abnormal screenings:   Depression score: 3  Patient concerns:   None  Nurse concerns:  None  Next PCP appt:   10/29/17 @ 1500

## 2017-11-01 NOTE — Telephone Encounter (Signed)
Rx called to pharmacy as instructed. 

## 2017-11-02 NOTE — Progress Notes (Signed)
   Subjective:    Patient ID: Grace Ortiz, female    DOB: 1946-04-07, 72 y.o.   MRN: [id number]  HPI I reviewed health advisor's note, was available for consultation, and agree with documentation and plan.    Review of Systems     Objective:   Physical Exam        Assessment & Plan:

## 2017-11-11 ENCOUNTER — Ambulatory Visit
Admission: RE | Admit: 2017-11-11 | Discharge: 2017-11-11 | Disposition: A | Discharge status: Home / Self Care | Payer: Medicare Other | Source: Ambulatory Visit | Attending: Primary Care | Admitting: Primary Care

## 2017-11-11 DIAGNOSIS — Z780 Asymptomatic menopausal state: Secondary | ICD-10-CM | POA: Diagnosis not present

## 2017-11-11 DIAGNOSIS — E2839 Other primary ovarian failure: Secondary | ICD-10-CM

## 2017-11-11 DIAGNOSIS — M85851 Other specified disorders of bone density and structure, right thigh: Secondary | ICD-10-CM | POA: Diagnosis not present

## 2017-11-12 ENCOUNTER — Encounter: Payer: Self-pay | Admitting: Primary Care

## 2017-12-10 ENCOUNTER — Encounter: Payer: Self-pay | Admitting: Primary Care

## 2017-12-10 ENCOUNTER — Other Ambulatory Visit (INDEPENDENT_AMBULATORY_CARE_PROVIDER_SITE_OTHER): Payer: Medicare Other

## 2017-12-10 DIAGNOSIS — E039 Hypothyroidism, unspecified: Secondary | ICD-10-CM

## 2017-12-10 LAB — TSH: TSH: 0.17 u[IU]/mL — AB (ref 0.35–4.50)

## 2017-12-14 MED ORDER — LEVOTHYROXINE SODIUM 75 MCG PO TABS
ORAL_TABLET | ORAL | 0 refills | Status: DC
Start: 2017-12-14 — End: 2018-03-11

## 2017-12-23 ENCOUNTER — Encounter: Payer: Self-pay | Admitting: Primary Care

## 2018-01-24 ENCOUNTER — Other Ambulatory Visit: Payer: Self-pay | Admitting: Primary Care

## 2018-01-24 DIAGNOSIS — E039 Hypothyroidism, unspecified: Secondary | ICD-10-CM

## 2018-01-31 ENCOUNTER — Other Ambulatory Visit (INDEPENDENT_AMBULATORY_CARE_PROVIDER_SITE_OTHER): Payer: Medicare Other

## 2018-01-31 DIAGNOSIS — E039 Hypothyroidism, unspecified: Secondary | ICD-10-CM | POA: Diagnosis not present

## 2018-01-31 LAB — TSH: TSH: 1.77 u[IU]/mL (ref 0.35–4.50)

## 2018-01-31 LAB — T4, FREE: FREE T4: 0.66 ng/dL (ref 0.60–1.60)

## 2018-02-01 ENCOUNTER — Encounter: Payer: Self-pay | Admitting: Primary Care

## 2018-03-11 ENCOUNTER — Other Ambulatory Visit: Payer: Self-pay | Admitting: Primary Care

## 2018-03-11 DIAGNOSIS — E039 Hypothyroidism, unspecified: Secondary | ICD-10-CM

## 2018-04-07 ENCOUNTER — Other Ambulatory Visit: Payer: Self-pay | Admitting: Primary Care

## 2018-04-07 DIAGNOSIS — E039 Hypothyroidism, unspecified: Secondary | ICD-10-CM

## 2018-04-30 ENCOUNTER — Other Ambulatory Visit: Payer: Self-pay | Admitting: Primary Care

## 2018-04-30 DIAGNOSIS — E039 Hypothyroidism, unspecified: Secondary | ICD-10-CM

## 2018-05-02 ENCOUNTER — Other Ambulatory Visit (INDEPENDENT_AMBULATORY_CARE_PROVIDER_SITE_OTHER): Payer: Medicare Other

## 2018-05-02 DIAGNOSIS — E039 Hypothyroidism, unspecified: Secondary | ICD-10-CM

## 2018-05-02 LAB — TSH: TSH: 1.79 u[IU]/mL (ref 0.35–4.50)

## 2018-06-06 ENCOUNTER — Other Ambulatory Visit: Payer: Medicare Other

## 2018-06-10 ENCOUNTER — Other Ambulatory Visit: Payer: Self-pay | Admitting: Primary Care

## 2018-06-10 DIAGNOSIS — Z1231 Encounter for screening mammogram for malignant neoplasm of breast: Secondary | ICD-10-CM

## 2018-06-20 ENCOUNTER — Other Ambulatory Visit: Payer: Self-pay | Admitting: Primary Care

## 2018-06-20 DIAGNOSIS — E039 Hypothyroidism, unspecified: Secondary | ICD-10-CM

## 2018-06-28 ENCOUNTER — Ambulatory Visit
Admission: RE | Admit: 2018-06-28 | Discharge: 2018-06-28 | Disposition: A | Discharge status: Home / Self Care | Payer: Medicare Other | Source: Ambulatory Visit | Attending: Primary Care | Admitting: Primary Care

## 2018-06-28 DIAGNOSIS — Z1231 Encounter for screening mammogram for malignant neoplasm of breast: Secondary | ICD-10-CM | POA: Diagnosis not present

## 2018-07-26 DIAGNOSIS — Z23 Encounter for immunization: Secondary | ICD-10-CM | POA: Diagnosis not present

## 2018-08-02 ENCOUNTER — Emergency Department: Payer: Medicare Other

## 2018-08-02 ENCOUNTER — Ambulatory Visit: Payer: Self-pay | Admitting: *Deleted

## 2018-08-02 ENCOUNTER — Inpatient Hospital Stay
Admission: EM | Admit: 2018-08-02 | Discharge: 2018-08-03 | DRG: 683 | Disposition: A | Discharge status: Home / Self Care | Payer: Medicare Other | Attending: Internal Medicine | Admitting: Internal Medicine

## 2018-08-02 ENCOUNTER — Other Ambulatory Visit: Payer: Self-pay

## 2018-08-02 DIAGNOSIS — S0990XA Unspecified injury of head, initial encounter: Secondary | ICD-10-CM | POA: Diagnosis not present

## 2018-08-02 DIAGNOSIS — Z79890 Hormone replacement therapy (postmenopausal): Secondary | ICD-10-CM

## 2018-08-02 DIAGNOSIS — E039 Hypothyroidism, unspecified: Secondary | ICD-10-CM | POA: Diagnosis present

## 2018-08-02 DIAGNOSIS — N179 Acute kidney failure, unspecified: Principal | ICD-10-CM

## 2018-08-02 DIAGNOSIS — N390 Urinary tract infection, site not specified: Secondary | ICD-10-CM

## 2018-08-02 DIAGNOSIS — R270 Ataxia, unspecified: Secondary | ICD-10-CM | POA: Diagnosis not present

## 2018-08-02 DIAGNOSIS — Z7982 Long term (current) use of aspirin: Secondary | ICD-10-CM

## 2018-08-02 DIAGNOSIS — Z79899 Other long term (current) drug therapy: Secondary | ICD-10-CM | POA: Diagnosis not present

## 2018-08-02 DIAGNOSIS — E860 Dehydration: Secondary | ICD-10-CM | POA: Diagnosis present

## 2018-08-02 DIAGNOSIS — E785 Hyperlipidemia, unspecified: Secondary | ICD-10-CM | POA: Diagnosis not present

## 2018-08-02 DIAGNOSIS — Z87891 Personal history of nicotine dependence: Secondary | ICD-10-CM

## 2018-08-02 DIAGNOSIS — F329 Major depressive disorder, single episode, unspecified: Secondary | ICD-10-CM | POA: Diagnosis present

## 2018-08-02 DIAGNOSIS — K219 Gastro-esophageal reflux disease without esophagitis: Secondary | ICD-10-CM | POA: Diagnosis present

## 2018-08-02 DIAGNOSIS — R531 Weakness: Secondary | ICD-10-CM | POA: Diagnosis not present

## 2018-08-02 DIAGNOSIS — N183 Chronic kidney disease, stage 3 unspecified: Secondary | ICD-10-CM | POA: Diagnosis present

## 2018-08-02 DIAGNOSIS — N289 Disorder of kidney and ureter, unspecified: Secondary | ICD-10-CM | POA: Diagnosis present

## 2018-08-02 DIAGNOSIS — E038 Other specified hypothyroidism: Secondary | ICD-10-CM | POA: Diagnosis not present

## 2018-08-02 DIAGNOSIS — R51 Headache: Secondary | ICD-10-CM | POA: Diagnosis not present

## 2018-08-02 HISTORY — DX: Urinary tract infection, site not specified: N39.0

## 2018-08-02 LAB — BASIC METABOLIC PANEL
Anion gap: 8 (ref 5–15)
Anion gap: 7 (ref 5–15)
BUN: 22 mg/dL (ref 8–23)
BUN: 18 mg/dL (ref 8–23)
CHLORIDE: 105 mmol/L (ref 98–111)
CO2: 24 mmol/L (ref 22–32)
CO2: 26 mmol/L (ref 22–32)
CREATININE: 1.32 mg/dL — AB (ref 0.44–1.00)
Calcium: 10.4 mg/dL — ABNORMAL HIGH (ref 8.9–10.3)
Calcium: 9.5 mg/dL (ref 8.9–10.3)
Chloride: 105 mmol/L (ref 98–111)
Creatinine, Ser: 1.3 mg/dL — ABNORMAL HIGH (ref 0.44–1.00)
GFR calc Af Amer: 46 mL/min — ABNORMAL LOW (ref >60)
GFR, EST AFRICAN AMERICAN: 45 mL/min — AB (ref >60)
GFR, EST NON AFRICAN AMERICAN: 39 mL/min — AB (ref >60)
GFR, EST NON AFRICAN AMERICAN: 40 mL/min — AB (ref >60)
Glucose, Bld: 81 mg/dL (ref 70–99)
Glucose, Bld: 120 mg/dL — ABNORMAL HIGH (ref 70–99)
POTASSIUM: 4 mmol/L (ref 3.5–5.1)
Potassium: 4.2 mmol/L (ref 3.5–5.1)
SODIUM: 137 mmol/L (ref 135–145)
SODIUM: 138 mmol/L (ref 135–145)

## 2018-08-02 LAB — CBC
HEMATOCRIT: 43.6 % (ref 36.0–46.0)
Hemoglobin: 14.7 g/dL (ref 12.0–15.0)
MCH: 32.4 pg (ref 26.0–34.0)
MCHC: 33.7 g/dL (ref 30.0–36.0)
MCV: 96 fL (ref 80.0–100.0)
NRBC: 0 % (ref 0.0–0.2)
Platelets: 184 10*3/uL (ref 150–400)
RBC: 4.54 MIL/uL (ref 3.87–5.11)
RDW: 13.9 % (ref 11.5–15.5)
WBC: 6.3 10*3/uL (ref 4.0–10.5)

## 2018-08-02 LAB — URINALYSIS, COMPLETE (UACMP) WITH MICROSCOPIC
Bilirubin Urine: NEGATIVE
GLUCOSE, UA: NEGATIVE mg/dL
Hgb urine dipstick: NEGATIVE
Ketones, ur: NEGATIVE mg/dL
Nitrite: NEGATIVE
PH: 7 (ref 5.0–8.0)
Protein, ur: NEGATIVE mg/dL
SPECIFIC GRAVITY, URINE: 1.01 (ref 1.005–1.030)

## 2018-08-02 LAB — DIFFERENTIAL
ABS IMMATURE GRANULOCYTES: 0.05 10*3/uL (ref 0.00–0.07)
Basophils Absolute: 0 10*3/uL (ref 0.0–0.1)
Basophils Relative: 1 %
EOS ABS: 0.1 10*3/uL (ref 0.0–0.5)
Eosinophils Relative: 2 %
IMMATURE GRANULOCYTES: 1 %
LYMPHS ABS: 1.5 10*3/uL (ref 0.7–4.0)
LYMPHS PCT: 24 %
Monocytes Absolute: 0.4 10*3/uL (ref 0.1–1.0)
Monocytes Relative: 7 %
NEUTROS ABS: 4.1 10*3/uL (ref 1.7–7.7)
Neutrophils Relative %: 65 %

## 2018-08-02 LAB — HEPATIC FUNCTION PANEL
ALBUMIN: 4 g/dL (ref 3.5–5.0)
ALK PHOS: 70 U/L (ref 38–126)
ALT: 33 U/L (ref 0–44)
AST: 38 U/L (ref 15–41)
Bilirubin, Direct: <0.1 mg/dL (ref 0.0–0.2)
Total Bilirubin: 0.6 mg/dL (ref 0.3–1.2)
Total Protein: 6.9 g/dL (ref 6.5–8.1)

## 2018-08-02 LAB — TSH: TSH: 64 u[IU]/mL — ABNORMAL HIGH (ref 0.350–4.500)

## 2018-08-02 LAB — TROPONIN I

## 2018-08-02 MED ORDER — SODIUM CHLORIDE 0.9 % IV BOLUS
1000.0000 mL | Freq: Once | INTRAVENOUS | Status: AC
  Administered 2018-08-02: 1000 mL via INTRAVENOUS

## 2018-08-02 MED ORDER — SODIUM CHLORIDE 0.9 % IV SOLN
1.0000 g | Freq: Once | INTRAVENOUS | Status: AC
  Administered 2018-08-02: 1 g via INTRAVENOUS
  Filled 2018-08-02: qty 10

## 2018-08-02 MED ORDER — CEPHALEXIN 500 MG PO CAPS
500.0000 mg | ORAL_CAPSULE | Freq: Once | ORAL | Status: AC
  Administered 2018-08-02: 500 mg via ORAL
  Filled 2018-08-02: qty 1

## 2018-08-02 NOTE — ED Notes (Signed)
Blood drawn after 600cc NS infused.  EDP aware

## 2018-08-02 NOTE — Telephone Encounter (Signed)
Lucynda also noted :  I noticed she has an appt with Dr. Lorelei Pont made by the agent for 08/03/18.  I didn't know if you wanted to keep this appt or not since I have referred this pt to the ED.  See prior note and triage notes I just sent over. Thanks.  Marijo Conception, RN  to [place]        08/02/18 9:07 AM  Pt of Allie Bossier I have referred to the ED.  Going to [facility].  See triage notes.

## 2018-08-02 NOTE — ED Notes (Signed)
Report given to RN on floor...

## 2018-08-02 NOTE — ED Triage Notes (Signed)
Pt states she has multiple complaints. States possible restless leg, not sleeping well, unsteady gait. Husband states yesterday they called MD and talked to RN about falling 2 days ago. He states she loses balance often. Does NOT use cane or walker. Pt main complaint is that she is cold all the time.    Alert, oriented, ambulatory. With family member. No distress noted. Speaking in complete sentences.

## 2018-08-02 NOTE — Discharge Instructions (Signed)
Please have your primary care doctor check on you later this week.  Your kidney function was off a little bit.  Additionally your thyroid level

## 2018-08-02 NOTE — ED Notes (Signed)
Pt and husband in room at this time. Per husband several days ago patient fell and was "tentative walking". Pt's husband also reports that patient was pale. Pt's husband reports some trouble walking up steps, not sleeping well, and increasing fatigue for the last few days. Pt ambulatory around the room, noted to be somewhat unstable while walking but is otherwise ambulatory without assistance. Pt's husband also reports headaches, pt states "they aren't like they used to be". Pt's husband reports they were referred here by PCP for CT scan. Pt is alert and oriented, NAD noted at this time.

## 2018-08-02 NOTE — Telephone Encounter (Signed)
Husband on phone.  Wife is falling more lately.   She is having a hard time walking.   Her left eye seems to be closing.   She is not sleeping well at night.   Her hearing seems to be affected even with her hearing aids.    Her skin is white and cold for the past week.   She is bundled up in clothes to stay warm.   All this has been going on for a few weeks.  She is very unsteady on her feet any time she is walking.  She's walking strange like unsure of next step.    Maybe she is anemic.    Pt fell yesterday.   She was bending over to pick something up and she toppled over.     She seem slower when she's talking.     Her left eye will be closed when looking at me.  When she turns her head to look at me her left eye will be closed.   She says,  "It's just dry".     She c/o things not being clear visually.   Due to the unsteadiness on her feet, the pale and cold skin and the other symptoms mentioned by her husband I referred them to the ED.   He is going to take her to [facility] now.  I routed a note to Allie Bossier, AGNP-C at [facility] so she would be aware of the ED referral.   Reason for Disposition . Pale skin (pallor)    Along with falling, unsteadiness with ambulation.  Answer Assessment - Initial Assessment Questions 1. DESCRIPTION: "Describe how you are feeling."     Husband calling in for wife.   She is asleep.    She fell yesterday while bending over to pick something up.   No injuries but this is unusual for her per husband.   He mentioned she is having a hard time walking lately.   She is very unsteady on her feet any time she is up walking.   She's also walking strange like she is unsure of the next step.   She is moving slower than her normal.    Her speech is also slower than her usual.   Her left eye I noticed is closed a lot of the time.    Her hearing is also not as good even with her hearing aids.   She also c/o of vision not being as clear lately even  with her glasses.      Husband also mentioned her skin is pale and cold.   She stays bundled up in clothes and blankets trying to stay warm which is not usual for her.   "I'm just concerned about her with all this especially the being unsteady on her feet". 2. SEVERITY: "How bad is it?"  "Can you stand and walk?"   - MILD - Feels weak or tired, but does not interfere with work, school or normal activities   - [place] to stand and walk; weakness interferes with work, school, or normal activities   - SEVERE - Unable to stand or walk     Severe.   Unsteady on her feet when she is ambulating.   She fell yesterday. 3. ONSET:  "When did the weakness begin?"     Per husband  "A few weeks". 4. CAUSE: "What do you think is causing the weakness?"     I don't know. 5.  MEDICINES: "Have you recently started a new medicine or had a change in the amount of a medicine?"     Not asked 6. OTHER SYMPTOMS: "Do you have any other symptoms?" (e.g., chest pain, fever, cough, SOB, vomiting, diarrhea, bleeding, other areas of pain)     No.   What was mentioned above. 7. PREGNANCY: "Is there any chance you are pregnant?" "When was your last menstrual period?"     Not asked due to age  Protocols used: WEAKNESS (GENERALIZED) [place]

## 2018-08-02 NOTE — ED Provider Notes (Signed)
[facility] Emergency Department Provider Note  ____________________________________________   First MD Initiated Contact with Patient 08/02/18 1433     (approximate)  I have reviewed the triage vital signs and the nursing notes.   HISTORY  Chief Complaint Gait Problem and Fall   HPI Grace Ortiz is a 72 y.o. female patient reports she is not feeling well for the last several days.  She fell 2 days ago.  Husband reports she seems to lose her balance a lot and has trouble walking up steps.  She was felt to be pale as well.  Patient was sent here for CT scan of the head.  Patient herself also complains of possibly having restless leg syndrome.  She says she does not have any trouble walking on flat ground and her husband agrees to just mostly cannot get over steps.  She also says she is cold all the time.  This is been going on for several days if not weeks getting slowly worse.   Past Medical History:  Diagnosis Date  . Arthritis   . Bell's palsy   . Cervical polyp   . Diverticulitis   . Frequent headaches   . GERD (gastroesophageal reflux disease)   . Hyperlipidemia   . Hypothyroidism   . Macular retinal puckering, bilateral   . Migraines     Patient Active Problem List   Diagnosis Date Noted  . GERD (gastroesophageal reflux disease) 06/30/2017  . Hyperlipidemia 06/30/2017  . Hypothyroidism 06/30/2017  . Frequent headaches 06/30/2017  . Anxiety and depression 06/30/2017    Past Surgical History:  Procedure Laterality Date  . BLEPHAROPLASTY  2008, 2009  . BREAST EXCISIONAL BIOPSY Left 05/1993   neg  . BREAST EXCISIONAL BIOPSY Left 09/1993   neg  . CATARACT EXTRACTION  2008  . CERVICAL POLYPECTOMY  2014  . HYSTEROSCOPY  2014    Prior to Admission medications   Medication Sig Start Date End Date Taking? Authorizing Provider  Ascorbic Acid (VITAMIN C PO) Take 1,000 Units by mouth daily.    [provider]  aspirin EC 81 MG  tablet Take 81 mg by mouth daily.    [provider]  buPROPion (WELLBUTRIN XL) 300 MG 24 hr tablet Take 1 tablet (300 mg total) by mouth daily. 10/29/17   Pleas Koch, NP  Cholecalciferol (VITAMIN D3 PO) Take 1,200 mg by mouth daily.    [provider]  citalopram (CELEXA) 40 MG tablet Take 1 tablet (40 mg total) by mouth daily. 10/29/17   Pleas Koch, NP  Cyanocobalamin (VITAMIN B12 PO) Take 2,500 mcg by mouth daily.    [provider]  diazepam (VALIUM) 5 MG tablet Take 1 tablet (5 mg total) by mouth at bedtime as needed for anxiety. 10/29/17   Pleas Koch, NP  levothyroxine (SYNTHROID, LEVOTHROID) 75 MCG tablet TAKE ONE TABLET BY MOUTH EVERY MORNING ON AN EMPTY STOMACH WITH A FULL GLASS OF WATER - DO NOT EAT WITHIN 30 MINUTES 06/22/18   Pleas Koch, NP  Omega-3 Fatty Acids (FISH OIL PO) Take 360 mg by mouth daily.    [provider]  omeprazole (PRILOSEC) 20 MG capsule Take 1 capsule (20 mg total) by mouth daily. 10/29/17   Pleas Koch, NP  polyethylene glycol powder (GLYCOLAX/MIRALAX) powder Take by mouth once.    [provider]  prochlorperazine (COMPAZINE) 10 MG tablet Take 1 tablet (10 mg total) by mouth every 6 (six) hours as needed for  nausea or vomiting. 10/29/17   Pleas Koch, NP  simvastatin (ZOCOR) 10 MG tablet Take 1 tablet (10 mg total) by mouth every evening. 10/29/17   Pleas Koch, NP  Tretinoin Microsphere (RETIN-A MICRO PUMP) 0.06 % GEL  10/14/17   [provider]    Allergies Patient has no known allergies.  Family History  Problem Relation Age of Onset  . Breast cancer Maternal Grandmother 30  . Cancer Maternal Grandmother        breast  . Early death Maternal Grandmother   . Hearing loss Mother   . Heart disease Mother   . Cancer Father        colon  . Hearing loss Father   . Diabetes Sister   . Early death Paternal Grandfather     Social History Social History    Tobacco Use  . Smoking status: Former Research scientist (life sciences)  . Smokeless tobacco: Never Used  Substance Use Topics  . Alcohol use: No  . Drug use: No    Review of Systems  Constitutional: No fever/chills Eyes: No visual changes. ENT: No sore throat. Cardiovascular: Denies chest pain. Respiratory: Denies shortness of breath. Gastrointestinal: No abdominal pain.  No nausea, no vomiting.  No diarrhea.  No constipation. Genitourinary: Negative for dysuria. Musculoskeletal: Negative for back pain. Skin: Negative for rash. Neurological: Negative for headaches, focal weakness  ____________________________________________   PHYSICAL EXAM:  VITAL SIGNS: ED Triage Vitals  Enc Vitals Group     BP 08/02/18 1132 (!) 145/79     Pulse Rate 08/02/18 1132 (!) 55     Resp 08/02/18 1132 16     Temp 08/02/18 1132 98 F (36.7 C)     Temp Source 08/02/18 1132 Oral     SpO2 08/02/18 1132 96 %     Weight 08/02/18 1134 160 lb (72.6 kg)     Height 08/02/18 1134 4\' 11"  (1.499 m)     Head Circumference --      Peak Flow --      Pain Score 08/02/18 1133 0     Pain Loc --      Pain Edu? --      Excl. in [place]? --     Constitutional: Alert and oriented. Well appearing and in no acute distress. Eyes: Conjunctivae are normal. PERRL. EOMI. fundi are somewhat difficult to see but look normal. Head: Atraumatic. Nose: No congestion/rhinnorhea. Mouth/Throat: Mucous membranes are moist.  Oropharynx non-erythematous. Neck: No stridor.   Cardiovascular: Normal rate, regular rhythm. Grossly normal heart sounds.  Good peripheral circulation. Respiratory: Normal respiratory effort.  No retractions. Lungs CTAB. Gastrointestinal: Soft and nontender. No distention. No abdominal bruits. No CVA tenderness. Musculoskeletal: No lower extremity tenderness nor edema.  No joint effusions. Neurologic:  Normal speech and language. No gross focal neurologic deficits are appreciated.  Cranial nerves II through XII are intact of the  visual fields were not checked cerebellar finger-to-nose heel-to-shin and rapid alternating movements and hands are normal motor strength is 5/5 throughout patient does not report any numbness Skin:  Skin is warm, dry and intact. No rash noted. Psychiatric: Mood and affect are normal. Speech and behavior are normal.  ____________________________________________   LABS (all labs ordered are listed, but only abnormal results are displayed)  Labs Reviewed  BASIC METABOLIC PANEL - Abnormal; Notable for the following components:      Result Value   Creatinine, Ser 1.32 (*)    Calcium 10.4 (*)    GFR calc non Af  Amer 39 (*)    GFR calc Af Amer 45 (*)    All other components within normal limits  URINALYSIS, COMPLETE (UACMP) WITH MICROSCOPIC - Abnormal; Notable for the following components:   Color, Urine STRAW (*)    APPearance CLEAR (*)    Leukocytes, UA MODERATE (*)    Bacteria, UA RARE (*)    All other components within normal limits  TSH - Abnormal; Notable for the following components:   TSH 64.000 (*)    All other components within normal limits  CBC  TROPONIN I  DIFFERENTIAL  HEPATIC FUNCTION PANEL   ____________________________________________  EKG  EKG read and interpreted by me shows sinus bradycardia rate of 53 normal axis some slight ST segment downsloping in 1 and L there are no old EKGs to compare with patient has no chest pain tightness or discomfort in her troponins negative ____________________________________________  RADIOLOGY  ED MD interpretation: CT of the head and MRI of the head showed no acute disease  Official radiology report(s): Ct Head Wo Contrast  Result Date: 08/02/2018 CLINICAL DATA:  Head trauma.  Ataxia.  Fall 2 days ago. EXAM: CT HEAD WITHOUT CONTRAST TECHNIQUE: Contiguous axial images were obtained from the base of the skull through the vertex without intravenous contrast. COMPARISON:  None. FINDINGS: Brain: No acute intracranial  abnormality. Specifically, no hemorrhage, hydrocephalus, mass lesion, acute infarction, or significant intracranial injury. Vascular: No hyperdense vessel or unexpected calcification. Skull: No acute calvarial abnormality. Sinuses/Orbits: Visualized paranasal sinuses and mastoids clear. Orbital soft tissues unremarkable. Other: None IMPRESSION: No acute intracranial abnormality. Electronically Signed   By: Rolm Baptise M.D.   On: 08/02/2018 12:07   Mr Brain Wo Contrast  Result Date: 08/02/2018 CLINICAL DATA:  Acute presentation with headache, gait disturbance and falling over the last 2 days. Negative head CT. EXAM: MRI HEAD WITHOUT CONTRAST TECHNIQUE: Multiplanar, multiecho pulse sequences of the brain and surrounding structures were obtained without intravenous contrast. COMPARISON:  Head CT same day FINDINGS: Brain: Diffusion imaging does not show any acute or subacute infarction. The brainstem and cerebellum are normal. Cerebral hemispheres show scattered foci of abnormal T2 and FLAIR signal within the deep and subcortical white matter consistent with small vessel ischemic change. No cortical or large vessel territory infarction. No mass lesion, hemorrhage, hydrocephalus or extra-axial collection. Vascular: Major vessels at the base of the brain show flow. Skull and upper cervical spine: Negative Sinuses/Orbits: Clear/normal Other: None IMPRESSION: No acute finding. Mild to moderate chronic small-vessel ischemic changes of the cerebral hemispheric white matter. Electronically Signed   By: Nelson Chimes M.D.   On: 08/02/2018 17:53    ____________________________________________   PROCEDURES  Procedure(s) performed:   Procedures  Critical Care performed:   ____________________________________________   INITIAL IMPRESSION / ASSESSMENT AND PLAN / ED COURSE  Patient with normal MRI.  She does have sinus bradycardia which is getting quite low and hypothyroidism with a T sh of 64.  She is  unsteady on her feet she says when she walks and has AKI and a UTI.  I think the safest thing to do would be to watch her in the hospital overnight and see if fluids and antibiotics will fix most of her problems get her back on a higher dose of levothyroxine and go from there.         ____________________________________________   FINAL CLINICAL IMPRESSION(S) / ED DIAGNOSES  Final diagnoses:  AKI (acute kidney injury) ([place])  Other specified hypothyroidism  Urinary tract infection  without hematuria, site unspecified     ED Discharge Orders    None       Note:  This document was prepared using Dragon voice recognition software and may include unintentional dictation errors.    Nena Polio, MD 08/02/18 [phone]

## 2018-08-02 NOTE — H&P (Signed)
[facility] NAME: Grace Ortiz    MR#:  [id number]  DATE OF BIRTH:  03-22-1946  DATE OF ADMISSION:  08/02/2018  PRIMARY CARE PHYSICIAN: Pleas Koch, NP   REQUESTING/REFERRING PHYSICIAN:   CHIEF COMPLAINT:   Chief Complaint  Patient presents with  . Gait Problem  . Fall    HISTORY OF PRESENT ILLNESS: Grace Ortiz  is a 72 y.o. female with a known history of arthritis, diverticulitis, hyperlipidemia, hypothyroidism and other comorbidities. Patient presented to emergency room for generalized weakness and fatigue and poor balance, especially going up to steps.  She also complains of being cold all the time.  Her symptoms have been going on for the past 2 to 3 weeks, gradually getting worse.  She had a fall 2 days ago, due to poor balance; no injuries. No reports of fever, chills, nausea, vomiting, diarrhea, bleeding. Blood test done emergency room revealed elevated TSH of 64,000 and elevated creatinine level of 1.32.  UA is positive for UTI. No acute findings per brain CT scan and MRI. Patient is admitted for further evaluation and treatment.  PAST MEDICAL HISTORY:   Past Medical History:  Diagnosis Date  . Arthritis   . Bell's palsy   . Cervical polyp   . Diverticulitis   . Frequent headaches   . GERD (gastroesophageal reflux disease)   . Hyperlipidemia   . Hypothyroidism   . Macular retinal puckering, bilateral   . Migraines     PAST SURGICAL HISTORY:  Past Surgical History:  Procedure Laterality Date  . BLEPHAROPLASTY  2008, 2009  . BREAST EXCISIONAL BIOPSY Left 05/1993   neg  . BREAST EXCISIONAL BIOPSY Left 09/1993   neg  . CATARACT EXTRACTION  2008  . CERVICAL POLYPECTOMY  2014  . HYSTEROSCOPY  2014    SOCIAL HISTORY:  Social History   Tobacco Use  . Smoking status: Former Research scientist (life sciences)  . Smokeless tobacco: Never Used  Substance Use Topics  . Alcohol use: No    FAMILY HISTORY:  Family  History  Problem Relation Age of Onset  . Breast cancer Maternal Grandmother 30  . Cancer Maternal Grandmother        breast  . Early death Maternal Grandmother   . Hearing loss Mother   . Heart disease Mother   . Cancer Father        colon  . Hearing loss Father   . Diabetes Sister   . Early death Paternal Grandfather     DRUG ALLERGIES: No Known Allergies  REVIEW OF SYSTEMS:   CONSTITUTIONAL: Positive for severe fatigue and generalized weakness.  EYES: No changes in vision.  EARS, NOSE, AND THROAT: No tinnitus or ear pain.  RESPIRATORY: No cough, shortness of breath, wheezing or hemoptysis.  CARDIOVASCULAR: No chest pain, orthopnea, edema.  GASTROINTESTINAL: No nausea, vomiting, diarrhea or abdominal pain.  GENITOURINARY: No dysuria, hematuria.  ENDOCRINE: No polyuria, nocturia. HEMATOLOGY: No bleeding. SKIN: No rash or lesion. MUSCULOSKELETAL: Positive for generalized weakness and unstable gait.   NEUROLOGIC: No focal weakness.  PSYCHIATRY: No anxiety or depression.   MEDICATIONS AT HOME:  Prior to Admission medications   Medication Sig Start Date End Date Taking? Authorizing Provider  Ascorbic Acid (VITAMIN C PO) Take 1,000 Units by mouth daily.    [provider]  aspirin EC 81 MG tablet Take 81 mg by mouth daily.    [provider]  buPROPion (WELLBUTRIN XL) 300 MG 24 hr  tablet Take 1 tablet (300 mg total) by mouth daily. 10/29/17   Pleas Koch, NP  Cholecalciferol (VITAMIN D3 PO) Take 1,200 mg by mouth daily.    [provider]  citalopram (CELEXA) 40 MG tablet Take 1 tablet (40 mg total) by mouth daily. 10/29/17   Pleas Koch, NP  Cyanocobalamin (VITAMIN B12 PO) Take 2,500 mcg by mouth daily.    [provider]  diazepam (VALIUM) 5 MG tablet Take 1 tablet (5 mg total) by mouth at bedtime as needed for anxiety. 10/29/17   Pleas Koch, NP  levothyroxine (SYNTHROID, LEVOTHROID) 75 MCG tablet TAKE ONE TABLET BY MOUTH  EVERY MORNING ON AN EMPTY STOMACH WITH A FULL GLASS OF WATER - DO NOT EAT WITHIN 30 MINUTES 06/22/18   Pleas Koch, NP  Omega-3 Fatty Acids (FISH OIL PO) Take 360 mg by mouth daily.    [provider]  omeprazole (PRILOSEC) 20 MG capsule Take 1 capsule (20 mg total) by mouth daily. 10/29/17   Pleas Koch, NP  polyethylene glycol powder (GLYCOLAX/MIRALAX) powder Take by mouth once.    [provider]  prochlorperazine (COMPAZINE) 10 MG tablet Take 1 tablet (10 mg total) by mouth every 6 (six) hours as needed for nausea or vomiting. 10/29/17   Pleas Koch, NP  simvastatin (ZOCOR) 10 MG tablet Take 1 tablet (10 mg total) by mouth every evening. 10/29/17   Pleas Koch, NP  Tretinoin Microsphere (RETIN-A MICRO PUMP) 0.06 % GEL  10/14/17   [provider]      PHYSICAL EXAMINATION:   VITAL SIGNS: Blood pressure (!) 123/103, pulse (!) 51, temperature 98.3 F (36.8 C), resp. rate 16, height 4\' 11"  (1.499 m), weight 72.6 kg, SpO2 99 %.  GENERAL:  72 y.o.-year-old patient lying in the bed with no acute distress.  EYES: Pupils equal, round, reactive to light and accommodation. No scleral icterus. Extraocular muscles intact.  HEENT: Head atraumatic, normocephalic. Oropharynx and nasopharynx clear.  NECK:  Supple, no jugular venous distention. No thyroid enlargement, no tenderness.  LUNGS: Normal breath sounds bilaterally, no wheezing, rales,rhonchi or crepitation. No use of accessory muscles of respiration.  CARDIOVASCULAR: S1, S2 normal. No S3/S4.  ABDOMEN: Soft, nontender, nondistended. Bowel sounds present. No organomegaly or mass.  EXTREMITIES: No pedal edema, cyanosis, or clubbing.  NEUROLOGIC: Cranial nerves II through XII are intact. Muscle strength 5/5 in all extremities. Sensation intact.   PSYCHIATRIC: The patient is alert and oriented x 3.  SKIN: No obvious rash, lesion, or ulcer.   LABORATORY PANEL:   CBC Recent Labs  Lab 08/02/18 1143   WBC 6.3  HGB 14.7  HCT 43.6  PLT 184  MCV 96.0  MCH 32.4  MCHC 33.7  RDW 13.9  LYMPHSABS 1.5  MONOABS 0.4  EOSABS 0.1  BASOSABS 0.0   ------------------------------------------------------------------------------------------------------------------  Chemistries  Recent Labs  Lab 08/02/18 1143 08/02/18 2048  NA 137 138  K 4.2 4.0  CL 105 105  CO2 24 26  GLUCOSE 81 120*  BUN 22 18  CREATININE 1.32* 1.30*  CALCIUM 10.4* 9.5  AST 38  --   ALT 33  --   ALKPHOS 70  --   BILITOT 0.6  --    ------------------------------------------------------------------------------------------------------------------ estimated creatinine clearance is 34 mL/min (A) (by C-G formula based on SCr of 1.3 mg/dL (H)). ------------------------------------------------------------------------------------------------------------------ Recent Labs    08/02/18 1143  TSH 64.000*     Coagulation profile No results for input(s): INR, PROTIME in  the last 168 hours. ------------------------------------------------------------------------------------------------------------------- No results for input(s): DDIMER in the last 72 hours. -------------------------------------------------------------------------------------------------------------------  Cardiac Enzymes Recent Labs  Lab 08/02/18 1143  TROPONINI <0.03   ------------------------------------------------------------------------------------------------------------------ Invalid input(s): POCBNP  ---------------------------------------------------------------------------------------------------------------  Urinalysis    Component Value Date/Time   COLORURINE STRAW (A) 08/02/2018 1143   APPEARANCEUR CLEAR (A) 08/02/2018 1143   LABSPEC 1.010 08/02/2018 1143   PHURINE 7.0 08/02/2018 1143   GLUCOSEU NEGATIVE 08/02/2018 1143   HGBUR NEGATIVE 08/02/2018 1143   BILIRUBINUR NEGATIVE 08/02/2018 1143   KETONESUR NEGATIVE 08/02/2018 1143    PROTEINUR NEGATIVE 08/02/2018 1143   NITRITE NEGATIVE 08/02/2018 1143   LEUKOCYTESUR MODERATE (A) 08/02/2018 1143     RADIOLOGY: Ct Head Wo Contrast  Result Date: 08/02/2018 CLINICAL DATA:  Head trauma.  Ataxia.  Fall 2 days ago. EXAM: CT HEAD WITHOUT CONTRAST TECHNIQUE: Contiguous axial images were obtained from the base of the skull through the vertex without intravenous contrast. COMPARISON:  None. FINDINGS: Brain: No acute intracranial abnormality. Specifically, no hemorrhage, hydrocephalus, mass lesion, acute infarction, or significant intracranial injury. Vascular: No hyperdense vessel or unexpected calcification. Skull: No acute calvarial abnormality. Sinuses/Orbits: Visualized paranasal sinuses and mastoids clear. Orbital soft tissues unremarkable. Other: None IMPRESSION: No acute intracranial abnormality. Electronically Signed   By: Rolm Baptise M.D.   On: 08/02/2018 12:07   Mr Brain Wo Contrast  Result Date: 08/02/2018 CLINICAL DATA:  Acute presentation with headache, gait disturbance and falling over the last 2 days. Negative head CT. EXAM: MRI HEAD WITHOUT CONTRAST TECHNIQUE: Multiplanar, multiecho pulse sequences of the brain and surrounding structures were obtained without intravenous contrast. COMPARISON:  Head CT same day FINDINGS: Brain: Diffusion imaging does not show any acute or subacute infarction. The brainstem and cerebellum are normal. Cerebral hemispheres show scattered foci of abnormal T2 and FLAIR signal within the deep and subcortical white matter consistent with small vessel ischemic change. No cortical or large vessel territory infarction. No mass lesion, hemorrhage, hydrocephalus or extra-axial collection. Vascular: Major vessels at the base of the brain show flow. Skull and upper cervical spine: Negative Sinuses/Orbits: Clear/normal Other: None IMPRESSION: No acute finding. Mild to moderate chronic small-vessel ischemic changes of the cerebral hemispheric white matter.  Electronically Signed   By: Nelson Chimes M.D.   On: 08/02/2018 17:53    EKG: Orders placed or performed during the hospital encounter of 08/02/18  . ED EKG  . ED EKG  . EKG 12-Lead  . EKG 12-Lead    IMPRESSION AND PLAN:  1.  Profound hypothyroidism.  Her thyroid function test was normal 3 months ago.  Patient admits that she has been taking her thyroid medication together with all the other medications, including vitamins and with food.  We will repeat TSH and check free T4. Will increase Synthroid from 75 MCG 200 MCG/day.  Patient was also advised to take her thyroid medication separately, at least 2 hours apart from food and other medications. 2.  Acute renal failure, likely prerenal.  We will start gentle IV hydration and monitor kidney function closely.  Avoid nephrotoxic medications. 3.  Acute UTI, will start treatment with IV antibiotics, ceftriaxone, while waiting for urine culture results. 4.  Hyperlipidemia, on statin  All the records are reviewed and case discussed with ED provider. Management plans discussed with the patient, family and they are in agreement.  CODE STATUS: Full    TOTAL TIME TAKING CARE OF THIS PATIENT: 50 minutes.    Amelia Jo M.D on 08/02/2018 at 10:32 PM  Between 7am to 6pm - Pager - [phone]  After 6pm go to [email or web address] - password [email or web address] [facility] [place] [facility]  [phone]  CC: Primary care physician; Pleas Koch, NP

## 2018-08-02 NOTE — Telephone Encounter (Signed)
It looks like she was triaged at [facility] ED and has had some lab work. Not sure if she's been seen by a provider as of yet. Please call to check on her tomorrow if possible.

## 2018-08-02 NOTE — ED Notes (Signed)
Pt just returned from MRI.  

## 2018-08-03 ENCOUNTER — Ambulatory Visit: Payer: Medicare Other | Admitting: Family Medicine

## 2018-08-03 DIAGNOSIS — N390 Urinary tract infection, site not specified: Secondary | ICD-10-CM | POA: Diagnosis not present

## 2018-08-03 DIAGNOSIS — E038 Other specified hypothyroidism: Secondary | ICD-10-CM | POA: Diagnosis not present

## 2018-08-03 DIAGNOSIS — R531 Weakness: Secondary | ICD-10-CM | POA: Diagnosis not present

## 2018-08-03 DIAGNOSIS — N183 Chronic kidney disease, stage 3 unspecified: Secondary | ICD-10-CM | POA: Diagnosis present

## 2018-08-03 DIAGNOSIS — Z87891 Personal history of nicotine dependence: Secondary | ICD-10-CM | POA: Diagnosis not present

## 2018-08-03 DIAGNOSIS — Z79899 Other long term (current) drug therapy: Secondary | ICD-10-CM | POA: Diagnosis not present

## 2018-08-03 DIAGNOSIS — E785 Hyperlipidemia, unspecified: Secondary | ICD-10-CM | POA: Diagnosis not present

## 2018-08-03 DIAGNOSIS — Z7982 Long term (current) use of aspirin: Secondary | ICD-10-CM | POA: Diagnosis not present

## 2018-08-03 DIAGNOSIS — N179 Acute kidney failure, unspecified: Secondary | ICD-10-CM | POA: Diagnosis not present

## 2018-08-03 DIAGNOSIS — Z79890 Hormone replacement therapy (postmenopausal): Secondary | ICD-10-CM | POA: Diagnosis not present

## 2018-08-03 DIAGNOSIS — N289 Disorder of kidney and ureter, unspecified: Secondary | ICD-10-CM | POA: Diagnosis present

## 2018-08-03 LAB — CBC
HCT: 40 % (ref 36.0–46.0)
HEMOGLOBIN: 13.3 g/dL (ref 12.0–15.0)
MCH: 32.2 pg (ref 26.0–34.0)
MCHC: 33.3 g/dL (ref 30.0–36.0)
MCV: 96.9 fL (ref 80.0–100.0)
Platelets: 157 10*3/uL (ref 150–400)
RBC: 4.13 MIL/uL (ref 3.87–5.11)
RDW: 13.8 % (ref 11.5–15.5)
WBC: 6.4 10*3/uL (ref 4.0–10.5)
nRBC: 0 % (ref 0.0–0.2)

## 2018-08-03 LAB — BASIC METABOLIC PANEL
Anion gap: 5 (ref 5–15)
BUN: 18 mg/dL (ref 8–23)
CALCIUM: 9 mg/dL (ref 8.9–10.3)
CO2: 26 mmol/L (ref 22–32)
CREATININE: 1.29 mg/dL — AB (ref 0.44–1.00)
Chloride: 106 mmol/L (ref 98–111)
GFR calc Af Amer: 47 mL/min — ABNORMAL LOW (ref >60)
GFR, EST NON AFRICAN AMERICAN: 40 mL/min — AB (ref >60)
GLUCOSE: 94 mg/dL (ref 70–99)
Potassium: 3.9 mmol/L (ref 3.5–5.1)
Sodium: 137 mmol/L (ref 135–145)

## 2018-08-03 LAB — GLUCOSE, CAPILLARY: Glucose-Capillary: 81 mg/dL (ref 70–99)

## 2018-08-03 LAB — T4, FREE: Free T4: 0.43 ng/dL — ABNORMAL LOW (ref 0.82–1.77)

## 2018-08-03 LAB — TSH: TSH: 63.123 u[IU]/mL — ABNORMAL HIGH (ref 0.350–4.500)

## 2018-08-03 MED ORDER — VITAMIN B-12 1000 MCG PO TABS
2500.0000 ug | ORAL_TABLET | Freq: Every day | ORAL | Status: DC
  Administered 2018-08-03: 2500 ug via ORAL
  Filled 2018-08-03: qty 3

## 2018-08-03 MED ORDER — SODIUM CHLORIDE 0.9 % IV SOLN
INTRAVENOUS | Status: DC
  Administered 2018-08-03: 01:00:00 via INTRAVENOUS

## 2018-08-03 MED ORDER — PANTOPRAZOLE SODIUM 40 MG PO TBEC
40.0000 mg | DELAYED_RELEASE_TABLET | Freq: Every day | ORAL | Status: DC
  Administered 2018-08-03: 40 mg via ORAL
  Filled 2018-08-03: qty 1

## 2018-08-03 MED ORDER — ONDANSETRON HCL 4 MG PO TABS: 4.0000 mg | ORAL_TABLET | Freq: Four times a day (QID) | ORAL | Status: DC | PRN

## 2018-08-03 MED ORDER — DOCUSATE SODIUM 100 MG PO CAPS
100.0000 mg | ORAL_CAPSULE | Freq: Two times a day (BID) | ORAL | Status: DC
  Administered 2018-08-03: 100 mg via ORAL
  Filled 2018-08-03: qty 1

## 2018-08-03 MED ORDER — LEVOTHYROXINE SODIUM 50 MCG PO TABS
75.0000 ug | ORAL_TABLET | Freq: Every day | ORAL | Status: DC
  Administered 2018-08-03: 75 ug via ORAL
  Filled 2018-08-03: qty 1

## 2018-08-03 MED ORDER — PROCHLORPERAZINE MALEATE 10 MG PO TABS
10.0000 mg | ORAL_TABLET | Freq: Four times a day (QID) | ORAL | Status: DC | PRN
  Filled 2018-08-03: qty 1

## 2018-08-03 MED ORDER — HEPARIN SODIUM (PORCINE) 5000 UNIT/ML IJ SOLN
5000.0000 [IU] | Freq: Three times a day (TID) | INTRAMUSCULAR | Status: DC
  Administered 2018-08-03: 5000 [IU] via SUBCUTANEOUS
  Filled 2018-08-03: qty 1

## 2018-08-03 MED ORDER — CEPHALEXIN 500 MG PO CAPS: 500.0000 mg | ORAL_CAPSULE | Freq: Two times a day (BID) | ORAL | 0 refills | Status: AC

## 2018-08-03 MED ORDER — ACETAMINOPHEN 325 MG PO TABS
650.0000 mg | ORAL_TABLET | Freq: Four times a day (QID) | ORAL | Status: DC | PRN
  Administered 2018-08-03: 650 mg via ORAL
  Filled 2018-08-03: qty 2

## 2018-08-03 MED ORDER — LEVOTHYROXINE SODIUM 200 MCG PO TABS: 200.0000 ug | ORAL_TABLET | Freq: Every day | ORAL | 11 refills | Status: DC

## 2018-08-03 MED ORDER — ONDANSETRON HCL 4 MG/2ML IJ SOLN: 4.0000 mg | Freq: Four times a day (QID) | INTRAMUSCULAR | Status: DC | PRN

## 2018-08-03 MED ORDER — BISACODYL 5 MG PO TBEC: 5.0000 mg | DELAYED_RELEASE_TABLET | Freq: Every day | ORAL | Status: DC | PRN

## 2018-08-03 MED ORDER — HYDROCODONE-ACETAMINOPHEN 5-325 MG PO TABS: 1.0000 | ORAL_TABLET | ORAL | Status: DC | PRN

## 2018-08-03 MED ORDER — DIAZEPAM 5 MG PO TABS: 5.0000 mg | ORAL_TABLET | Freq: Every evening | ORAL | Status: DC | PRN

## 2018-08-03 MED ORDER — ACETAMINOPHEN 650 MG RE SUPP: 650.0000 mg | Freq: Four times a day (QID) | RECTAL | Status: DC | PRN

## 2018-08-03 MED ORDER — CITALOPRAM HYDROBROMIDE 20 MG PO TABS
40.0000 mg | ORAL_TABLET | Freq: Every day | ORAL | Status: DC
  Administered 2018-08-03: 40 mg via ORAL
  Filled 2018-08-03: qty 2

## 2018-08-03 MED ORDER — SODIUM CHLORIDE 0.9 % IV SOLN
1.0000 g | INTRAVENOUS | Status: DC
  Filled 2018-08-03: qty 10

## 2018-08-03 MED ORDER — ASPIRIN EC 81 MG PO TBEC
81.0000 mg | DELAYED_RELEASE_TABLET | Freq: Every day | ORAL | Status: DC
  Administered 2018-08-03: 81 mg via ORAL
  Filled 2018-08-03: qty 1

## 2018-08-03 MED ORDER — VITAMIN C 500 MG PO TABS
250.0000 mg | ORAL_TABLET | Freq: Every day | ORAL | Status: DC
  Administered 2018-08-03: 250 mg via ORAL
  Filled 2018-08-03: qty 1

## 2018-08-03 MED ORDER — BUPROPION HCL ER (XL) 150 MG PO TB24
300.0000 mg | ORAL_TABLET | Freq: Every day | ORAL | Status: DC
  Administered 2018-08-03: 300 mg via ORAL
  Filled 2018-08-03: qty 2

## 2018-08-03 MED ORDER — TRAZODONE HCL 50 MG PO TABS: 25.0000 mg | ORAL_TABLET | Freq: Every evening | ORAL | Status: DC | PRN

## 2018-08-03 MED ORDER — CIPROFLOXACIN HCL 500 MG PO TABS: 500.0000 mg | ORAL_TABLET | Freq: Every day | ORAL | 0 refills | Status: DC

## 2018-08-03 MED ORDER — SIMVASTATIN 20 MG PO TABS: 10.0000 mg | ORAL_TABLET | Freq: Every evening | ORAL | Status: DC

## 2018-08-03 NOTE — Telephone Encounter (Signed)
Message left for patient to return my call.  

## 2018-08-03 NOTE — Telephone Encounter (Signed)
Noted, we will see her during hospital follow up on the 23rd.

## 2018-08-03 NOTE — Telephone Encounter (Signed)
Spoken to patient and she is doing okay, much better now.

## 2018-08-05 ENCOUNTER — Telehealth: Payer: Self-pay | Admitting: *Deleted

## 2018-08-05 NOTE — Telephone Encounter (Signed)
Lm requesting return call to complete TCM and confirm hosp f/u appt  

## 2018-08-08 NOTE — Discharge Summary (Addendum)
Grace Ortiz, is a 72 y.o. female  DOB 05/05/1946  MRN [id number].  Admission date:  08/02/2018  Admitting Physician  Amelia Jo, MD  Discharge Date:  08/08/2018   Primary MD  Pleas Koch, NP  Recommendations for primary care physician for things to follow:  Follow-up with PCP in 1 week   Admission Diagnosis  AKI (acute kidney injury) ([place]) [N17.9] Other specified hypothyroidism [E03.8] Urinary tract infection without hematuria, site unspecified [N39.0]   Discharge Diagnosis  AKI (acute kidney injury) ([place]) [N17.9] Other specified hypothyroidism [E03.8] Urinary tract infection without hematuria, site unspecified [N39.0]    Active Problems:   Acute UTI   AKI (acute kidney injury) ([place])      Past Medical History:  Diagnosis Date  . Arthritis   . Bell's palsy   . Cervical polyp   . Diverticulitis   . Frequent headaches   . GERD (gastroesophageal reflux disease)   . Hyperlipidemia   . Hypothyroidism   . Macular retinal puckering, bilateral   . Migraines     Past Surgical History:  Procedure Laterality Date  . BLEPHAROPLASTY  2008, 2009  . BREAST EXCISIONAL BIOPSY Left 05/1993   neg  . BREAST EXCISIONAL BIOPSY Left 09/1993   neg  . CATARACT EXTRACTION  2008  . CERVICAL POLYPECTOMY  2014  . HYSTEROSCOPY  2014       History of present illness and  Hospital Course:     Kindly see H&P for history of present illness and admission details, please review complete Labs, Consult reports and Test reports for all details in brief  HPI  from the history and physical done on the day of admission Patient admitted for generalized weakness, Unstable gait, did not have fever, chills, vomiting, diarrhea.  Admitted for gait problems, generalized weakness.  Hospital Course  Generalized weakness secondary  to dehydration, profound hypothyroidism: Symptoms improved with IV fluids, patient CT head, MRI of the brain negative.  Patient seen TSH found to be very elevated up to 63, patient was taking Synthyroid 75 mcg increase the dose to 100 mcg at discharge.  Called the pharmacy and decrease the dose from  200 mcg so discharge patient discharged with 100 mcg instead of 200 mcg. #2 UTI: Patient was taking Keflex, advised to continue and finish for 5 days. 3.  Depression: Continue home medicines.  4.hlp.;;  Continue statins. 5.  Acute kidney injury: Resolved with fluids. Discharge Condition: Stable   Follow UP  Follow-up Information    Pleas Koch, NP. Schedule an appointment as soon as possible for a visit on 08/10/2018.   Specialty:  Internal Medicine Why:  AT [address] information: [place] [address] [phone]             Discharge Instructions  and  Discharge Medications      Allergies as of 08/03/2018   No Known Allergies     Medication List    TAKE these medications   aspirin EC 81 MG tablet Take 81 mg by mouth daily.   buPROPion 300 MG 24 hr tablet Commonly known as:  WELLBUTRIN XL Take 1 tablet (300 mg total) by mouth daily.   cephALEXin 500 MG capsule Commonly known as:  KEFLEX Take 1 capsule (500 mg total) by mouth 2 (two) times daily for 5 days.   citalopram 40 MG tablet Commonly known as:  CELEXA Take 1 tablet (40 mg total) by mouth daily.   diazepam 5  MG tablet Commonly known as:  VALIUM Take 1 tablet (5 mg total) by mouth at bedtime as needed for anxiety.   FISH OIL PO Take 360 mg by mouth daily.   levothyroxine 200 MCG tablet Commonly known as:  SYNTHROID, LEVOTHROID Take 1 tablet (200 mcg total) by mouth daily. What changed:    medication strength  See the new instructions.   omeprazole 20 MG capsule Commonly known as:  PRILOSEC Take 1 capsule (20 mg total) by mouth daily.   polyethylene glycol  packet Commonly known as:  MIRALAX / GLYCOLAX Take 17 g by mouth daily.   prochlorperazine 10 MG tablet Commonly known as:  COMPAZINE Take 1 tablet (10 mg total) by mouth every 6 (six) hours as needed for nausea or vomiting.   RETIN-A MICRO PUMP 0.06 % Gel Generic drug:  Tretinoin Microsphere 1 Pump every other day.   simvastatin 10 MG tablet Commonly known as:  ZOCOR Take 1 tablet (10 mg total) by mouth every evening.   VITAMIN B12 PO Take 2,500 mcg by mouth daily.   VITAMIN C PO Take 1,000 Units by mouth daily.   VITAMIN D3 PO Take 1,200 mg by mouth daily.         Diet and Activity recommendation: See Discharge Instructions above   Consults obtained -none   Major procedures and Radiology Reports - PLEASE review detailed and final reports for all details, in brief -     Ct Head Wo Contrast  Result Date: 08/02/2018 CLINICAL DATA:  Head trauma.  Ataxia.  Fall 2 days ago. EXAM: CT HEAD WITHOUT CONTRAST TECHNIQUE: Contiguous axial images were obtained from the base of the skull through the vertex without intravenous contrast. COMPARISON:  None. FINDINGS: Brain: No acute intracranial abnormality. Specifically, no hemorrhage, hydrocephalus, mass lesion, acute infarction, or significant intracranial injury. Vascular: No hyperdense vessel or unexpected calcification. Skull: No acute calvarial abnormality. Sinuses/Orbits: Visualized paranasal sinuses and mastoids clear. Orbital soft tissues unremarkable. Other: None IMPRESSION: No acute intracranial abnormality. Electronically Signed   By: Rolm Baptise M.D.   On: 08/02/2018 12:07   Mr Brain Wo Contrast  Result Date: 08/02/2018 CLINICAL DATA:  Acute presentation with headache, gait disturbance and falling over the last 2 days. Negative head CT. EXAM: MRI HEAD WITHOUT CONTRAST TECHNIQUE: Multiplanar, multiecho pulse sequences of the brain and surrounding structures were obtained without intravenous contrast. COMPARISON:  Head CT  same day FINDINGS: Brain: Diffusion imaging does not show any acute or subacute infarction. The brainstem and cerebellum are normal. Cerebral hemispheres show scattered foci of abnormal T2 and FLAIR signal within the deep and subcortical white matter consistent with small vessel ischemic change. No cortical or large vessel territory infarction. No mass lesion, hemorrhage, hydrocephalus or extra-axial collection. Vascular: Major vessels at the base of the brain show flow. Skull and upper cervical spine: Negative Sinuses/Orbits: Clear/normal Other: None IMPRESSION: No acute finding. Mild to moderate chronic small-vessel ischemic changes of the cerebral hemispheric white matter. Electronically Signed   By: Nelson Chimes M.D.   On: 08/02/2018 17:53    Micro Results   No results found for this or any previous visit (from the past 240 hour(s)).     Today   Subjective:   Grace Ortiz today has no headache,no chest abdominal pain,no new weakness tingling or numbness, feels much better wants to go home today.   Objective:   Blood pressure (!) 150/76, pulse (!) 51, temperature 97.7 F (36.5 C), temperature source Oral, resp. rate 14,  height 4\' 11"  (1.499 m), weight 76.1 kg, SpO2 95 %.  No intake or output data in the 24 hours ending 08/08/18 1512  Exam Awake Alert, Oriented x 3, No new F.N deficits, Normal affect [place].AT,PERRAL Supple Neck,No JVD, No cervical lymphadenopathy appriciated.  Symmetrical Chest wall movement, Good air movement bilaterally, CTAB RRR,No Gallops,Rubs or new Murmurs, No Parasternal Heave +ve B.Sounds, Abd Soft, Non tender, No organomegaly appriciated, No rebound -guarding or rigidity. No Cyanosis, Clubbing or edema, No new Rash or bruise  Data Review   CBC w Diff:  Lab Results  Component Value Date   WBC 6.4 08/03/2018   HGB 13.3 08/03/2018   HCT 40.0 08/03/2018   PLT 157 08/03/2018   LYMPHOPCT 24 08/02/2018   MONOPCT 7 08/02/2018   EOSPCT 2 08/02/2018    BASOPCT 1 08/02/2018    CMP:  Lab Results  Component Value Date   NA 137 08/03/2018   K 3.9 08/03/2018   CL 106 08/03/2018   CO2 26 08/03/2018   BUN 18 08/03/2018   CREATININE 1.29 (H) 08/03/2018   PROT 6.9 08/02/2018   ALBUMIN 4.0 08/02/2018   BILITOT 0.6 08/02/2018   ALKPHOS 70 08/02/2018   AST 38 08/02/2018   ALT 33 08/02/2018  .   Total Time in preparing paper work, data evaluation and todays exam - 35 minutes  Epifanio Lesches M.D on 08/03/2018 at 3:12 PM    Note: This dictation was prepared with Dragon dictation along with smaller phrase technology. Any transcriptional errors that result from this process are unintentional.

## 2018-08-10 ENCOUNTER — Inpatient Hospital Stay: Payer: Medicare Other | Admitting: Primary Care

## 2018-08-10 ENCOUNTER — Ambulatory Visit: Payer: Medicare Other | Admitting: Primary Care

## 2018-08-10 NOTE — Progress Notes (Deleted)
   Subjective:    Patient ID: Grace Ortiz, female    DOB: 24-Aug-1946, 72 y.o.   MRN: [id number]  HPI  Grace Ortiz is a 72 year old female who presents today for hospital follow up.  She presented to [facility] ED on 08/02/18 with reports of fatigue, dizziness, looking pale, difficulty with balance and walking up steps with a fall two days prior. During her stay in the ED she underwent CT and MRI of the brain which were both negative for acute process. Labs with TSH of 64, acute kidney injury, and acute cystitis. She was admitted for further evaluation and treatment.  During her hospital stay her levothyroxine was increased to 200 mcg. She was treated with IV fluids and antibiotics. Her symptoms improved prior to discharge. She was discharged home on 08/03/18 with a prescription for levothyroxine 100 mcg and cephalexin antibiotics.   Since her discharge home   Review of Systems     Objective:   Physical Exam         Assessment & Plan:

## 2018-08-12 ENCOUNTER — Ambulatory Visit (INDEPENDENT_AMBULATORY_CARE_PROVIDER_SITE_OTHER): Payer: Medicare Other | Admitting: Primary Care

## 2018-08-12 ENCOUNTER — Encounter: Payer: Self-pay | Admitting: Primary Care

## 2018-08-12 VITALS — BP 152/94 | HR 74 | Temp 98.3°F | Ht 59.0 in | Wt 166.0 lb

## 2018-08-12 DIAGNOSIS — N179 Acute kidney failure, unspecified: Secondary | ICD-10-CM

## 2018-08-12 DIAGNOSIS — Z09 Encounter for follow-up examination after completed treatment for conditions other than malignant neoplasm: Secondary | ICD-10-CM | POA: Diagnosis not present

## 2018-08-12 DIAGNOSIS — E039 Hypothyroidism, unspecified: Secondary | ICD-10-CM | POA: Diagnosis not present

## 2018-08-12 NOTE — Progress Notes (Signed)
Subjective:    Patient ID: Grace Ortiz, female    DOB: 1945-12-14, 72 y.o.   MRN: [id number]  HPI  Grace Ortiz is a 72 year old female who presents today for hospital follow up.  She presented to [facility] ED on 08/02/18 with reports of a fall walking up steps and subsequently hitting her head. She also reported difficulty walking up steps, feeling dizzy, weakness.   During her stay in the ED she underwent CT head and MRI of brain which was unremarkable. ECG with sinus bradycardia with rate of 53, slight ST segment down sloping. Labs including troponin were negative, but did show acute kidney injury and urinary tract infection. TSH of 64. TSH over the last 6 months were within normal range. She was admitted for further evaluation and treatment.  During her hospital stay she was treated with IV fluids and antibiotics. Her levothyroxine was increased to 200 mcg. She was discharged home with prescriptions for cephalexin and levothyroxine 100 mcg.   Since her discharge home she'd doing better. She still notices some difficulty with gait when walking upstairs, denies falls, is overall improving. She's taking her levothyroxine every morning with her other medications, she's been doing this for years. She's not sure if the manufacturer of her levothyroxine changed with her last refill as she endorses daily compliance. She completed the oral antibiotics that were prescribed for UTI.   Review of Systems  Constitutional:       Fatigue is improving  Respiratory: Negative for shortness of breath.   Cardiovascular: Negative for chest pain.  Endocrine: Negative for cold intolerance and heat intolerance.  Genitourinary: Negative for dysuria.  Musculoskeletal:       Denies falls  Neurological: Negative for dizziness.       Past Medical History:  Diagnosis Date  . Arthritis   . Bell's palsy   . Cervical polyp   . Diverticulitis   . Frequent headaches   . GERD (gastroesophageal reflux disease)   .  Hyperlipidemia   . Hypothyroidism   . Macular retinal puckering, bilateral   . Migraines      Social History   Socioeconomic History  . Marital status: Married    Spouse name: Not on file  . Number of children: Not on file  . Years of education: Not on file  . Highest education level: Not on file  Occupational History  . Not on file  Social Needs  . Financial resource strain: Not on file  . Food insecurity:    Worry: Not on file    Inability: Not on file  . Transportation needs:    Medical: Not on file    Non-medical: Not on file  Tobacco Use  . Smoking status: Former Research scientist (life sciences)  . Smokeless tobacco: Never Used  Substance and Sexual Activity  . Alcohol use: No  . Drug use: No  . Sexual activity: Not on file  Lifestyle  . Physical activity:    Days per week: Not on file    Minutes per session: Not on file  . Stress: Not on file  Relationships  . Social connections:    Talks on phone: Not on file    Gets together: Not on file    Attends religious service: Not on file    Active member of club or organization: Not on file    Attends meetings of clubs or organizations: Not on file    Relationship status: Not on file  . Intimate partner violence:  Fear of current or ex partner: Not on file    Emotionally abused: Not on file    Physically abused: Not on file    Forced sexual activity: Not on file  Other Topics Concern  . Not on file  Social History Narrative   Married.   No children.   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    Moved from [place].    Past Surgical History:  Procedure Laterality Date  . BLEPHAROPLASTY  2008, 2009  . BREAST EXCISIONAL BIOPSY Left 05/1993   neg  . BREAST EXCISIONAL BIOPSY Left 09/1993   neg  . CATARACT EXTRACTION  2008  . CERVICAL POLYPECTOMY  2014  . HYSTEROSCOPY  2014    Family History  Problem Relation Age of Onset  . Breast cancer Maternal Grandmother 30  . Cancer Maternal Grandmother        breast  . Early  death Maternal Grandmother   . Hearing loss Mother   . Heart disease Mother   . Cancer Father        colon  . Hearing loss Father   . Diabetes Sister   . Early death Paternal Grandfather     No Known Allergies  Current Outpatient Medications on File Prior to Visit  Medication Sig Dispense Refill  . Ascorbic Acid (VITAMIN C PO) Take 1,000 Units by mouth daily.    Marland Kitchen aspirin EC 81 MG tablet Take 81 mg by mouth daily.    Marland Kitchen buPROPion (WELLBUTRIN XL) 300 MG 24 hr tablet Take 1 tablet (300 mg total) by mouth daily. 90 tablet 3  . Cholecalciferol (VITAMIN D3 PO) Take 1,200 mg by mouth daily.    . citalopram (CELEXA) 40 MG tablet Take 1 tablet (40 mg total) by mouth daily. 90 tablet 3  . Cyanocobalamin (VITAMIN B12 PO) Take 2,500 mcg by mouth daily.    . diazepam (VALIUM) 5 MG tablet Take 1 tablet (5 mg total) by mouth at bedtime as needed for anxiety. 30 tablet 0  . levothyroxine (SYNTHROID) 200 MCG tablet Take 1 tablet (200 mcg total) by mouth daily. 30 tablet 11  . Omega-3 Fatty Acids (FISH OIL PO) Take 360 mg by mouth daily.    Marland Kitchen omeprazole (PRILOSEC) 20 MG capsule Take 1 capsule (20 mg total) by mouth daily. 90 capsule 3  . polyethylene glycol (MIRALAX / GLYCOLAX) packet Take 17 g by mouth daily.     . prochlorperazine (COMPAZINE) 10 MG tablet Take 1 tablet (10 mg total) by mouth every 6 (six) hours as needed for nausea or vomiting. 30 tablet 0  . simvastatin (ZOCOR) 10 MG tablet Take 1 tablet (10 mg total) by mouth every evening. 90 tablet 3  . Tretinoin Microsphere (RETIN-A MICRO PUMP) 0.06 % GEL 1 Pump every other day.      No current facility-administered medications on file prior to visit.     BP (!) 152/94   Pulse 74   Temp 98.3 F (36.8 C) (Oral)   Ht 4\' 11"  (1.499 m)   Wt 166 lb (75.3 kg)   SpO2 97%   BMI 33.53 kg/m    Objective:   Physical Exam  Constitutional: She appears well-nourished.  Neck: Neck supple.  Cardiovascular: Normal rate and regular rhythm.    Respiratory: Effort normal and breath sounds normal.  Skin: Skin is warm and dry.  Psychiatric: She has a normal mood and affect.           Assessment & Plan:

## 2018-08-12 NOTE — Patient Instructions (Signed)
Stop by the lab prior to leaving today. I will notify you of your results once received.   Take your levothyroxine every morning with water only on an empty stomach. Do not eat or take any other medications for 30 minutes.  Your omeprazole must be taken 4 hours or later than levothyroxine. Your calcium must be taken 4 hours or later than levothyroxine.   Schedule a lab only appointment to repeat your thyroid function in 4 weeks.  It was a pleasure to see you today!

## 2018-08-12 NOTE — Assessment & Plan Note (Addendum)
Admitted for complaints of weakness, difficulty with gait, pale skin, cold intolerance. TSH of 64 despite compliance.   Discussed to take levothyroxine every morning with water only, no food or medications for 30 minutes. Discussed to separate pantoprazole and calcium 4 hours after levothyroxine.  Repeat TSH pending now. Repeat again in 4 weeks. Continue levothyroxine 100 mcg for now.   Hospital notes, labs, imaging reviewed.

## 2018-08-12 NOTE — Assessment & Plan Note (Signed)
Repeat BMP pending. 

## 2018-08-13 LAB — BASIC METABOLIC PANEL
BUN / CREAT RATIO: 12 (calc) (ref 6–22)
BUN: 17 mg/dL (ref 7–25)
CHLORIDE: 101 mmol/L (ref 98–110)
CO2: 28 mmol/L (ref 20–32)
CREATININE: 1.37 mg/dL — AB (ref 0.60–0.93)
Calcium: 10.3 mg/dL (ref 8.6–10.4)
Glucose, Bld: 91 mg/dL (ref 65–99)
Potassium: 4.5 mmol/L (ref 3.5–5.3)
Sodium: 139 mmol/L (ref 135–146)

## 2018-08-13 LAB — TSH: TSH: 38.23 mIU/L — ABNORMAL HIGH (ref 0.40–4.50)

## 2018-08-30 ENCOUNTER — Other Ambulatory Visit: Payer: Self-pay | Admitting: Primary Care

## 2018-08-30 DIAGNOSIS — E039 Hypothyroidism, unspecified: Secondary | ICD-10-CM

## 2018-09-09 ENCOUNTER — Other Ambulatory Visit (INDEPENDENT_AMBULATORY_CARE_PROVIDER_SITE_OTHER): Payer: Medicare Other

## 2018-09-09 ENCOUNTER — Other Ambulatory Visit: Payer: Self-pay | Admitting: Primary Care

## 2018-09-09 DIAGNOSIS — E039 Hypothyroidism, unspecified: Secondary | ICD-10-CM

## 2018-09-09 LAB — TSH: TSH: 0.24 u[IU]/mL — AB (ref 0.35–4.50)

## 2018-09-12 MED ORDER — LEVOTHYROXINE SODIUM 88 MCG PO TABS: ORAL_TABLET | ORAL | 0 refills | Status: DC

## 2018-09-19 ENCOUNTER — Other Ambulatory Visit: Payer: Self-pay | Admitting: *Deleted

## 2018-09-19 DIAGNOSIS — F329 Major depressive disorder, single episode, unspecified: Secondary | ICD-10-CM

## 2018-09-19 DIAGNOSIS — K219 Gastro-esophageal reflux disease without esophagitis: Secondary | ICD-10-CM

## 2018-09-19 DIAGNOSIS — F32A Depression, unspecified: Secondary | ICD-10-CM

## 2018-09-19 DIAGNOSIS — E785 Hyperlipidemia, unspecified: Secondary | ICD-10-CM

## 2018-09-19 DIAGNOSIS — F419 Anxiety disorder, unspecified: Secondary | ICD-10-CM

## 2018-09-19 MED ORDER — SIMVASTATIN 10 MG PO TABS: 10.0000 mg | ORAL_TABLET | Freq: Every evening | ORAL | 0 refills | Status: DC

## 2018-09-19 MED ORDER — CITALOPRAM HYDROBROMIDE 40 MG PO TABS: 40.0000 mg | ORAL_TABLET | Freq: Every day | ORAL | 0 refills | Status: DC

## 2018-09-19 MED ORDER — OMEPRAZOLE 20 MG PO CPDR: 20.0000 mg | DELAYED_RELEASE_CAPSULE | Freq: Every day | ORAL | 0 refills | Status: DC

## 2018-09-19 MED ORDER — BUPROPION HCL ER (XL) 300 MG PO TB24: 300.0000 mg | ORAL_TABLET | Freq: Every day | ORAL | 0 refills | Status: DC

## 2018-10-14 ENCOUNTER — Other Ambulatory Visit: Payer: Self-pay | Admitting: Primary Care

## 2018-10-14 DIAGNOSIS — X32XXXA Exposure to sunlight, initial encounter: Secondary | ICD-10-CM | POA: Diagnosis not present

## 2018-10-14 DIAGNOSIS — D225 Melanocytic nevi of trunk: Secondary | ICD-10-CM | POA: Diagnosis not present

## 2018-10-14 DIAGNOSIS — R51 Headache: Secondary | ICD-10-CM

## 2018-10-14 DIAGNOSIS — E039 Hypothyroidism, unspecified: Secondary | ICD-10-CM

## 2018-10-14 DIAGNOSIS — L821 Other seborrheic keratosis: Secondary | ICD-10-CM | POA: Diagnosis not present

## 2018-10-14 DIAGNOSIS — F419 Anxiety disorder, unspecified: Principal | ICD-10-CM

## 2018-10-14 DIAGNOSIS — L570 Actinic keratosis: Secondary | ICD-10-CM | POA: Diagnosis not present

## 2018-10-14 DIAGNOSIS — E785 Hyperlipidemia, unspecified: Secondary | ICD-10-CM

## 2018-10-14 DIAGNOSIS — D2261 Melanocytic nevi of right upper limb, including shoulder: Secondary | ICD-10-CM | POA: Diagnosis not present

## 2018-10-14 DIAGNOSIS — K219 Gastro-esophageal reflux disease without esophagitis: Secondary | ICD-10-CM

## 2018-10-14 DIAGNOSIS — D2262 Melanocytic nevi of left upper limb, including shoulder: Secondary | ICD-10-CM | POA: Diagnosis not present

## 2018-10-14 DIAGNOSIS — D2272 Melanocytic nevi of left lower limb, including hip: Secondary | ICD-10-CM | POA: Diagnosis not present

## 2018-10-14 DIAGNOSIS — D2271 Melanocytic nevi of right lower limb, including hip: Secondary | ICD-10-CM | POA: Diagnosis not present

## 2018-10-14 DIAGNOSIS — F329 Major depressive disorder, single episode, unspecified: Secondary | ICD-10-CM

## 2018-10-14 DIAGNOSIS — R519 Headache, unspecified: Secondary | ICD-10-CM

## 2018-10-14 NOTE — Telephone Encounter (Signed)
Patient is changing pharmacy and insurance according to the note in the envelope. Patient is requesting new Rx and requesting 3 refills. Last seen on 08/12/2018. Next schedule appointment with Allie Bossier is on 11/08/2018. Please advise.   Bupropion, citalopram, omeprazole,   and simvastatin last prescribed on 09/19/2018  Diazepam and prochlorperazine  last prescribed on 10/29/2017 #30 with no refills  levothyroxine last prescribed on 09/12/2018

## 2018-10-14 NOTE — Telephone Encounter (Signed)
Will send patient my chart message. All she needs to do is contact the pharmacy of her choice and have them transfer the prescriptions over from [facility].

## 2018-10-14 NOTE — Telephone Encounter (Signed)
Patient's husband came in to drop off an envelope of prescriptions that need to be filled for next year.

## 2018-10-16 NOTE — Telephone Encounter (Signed)
See mychart messages

## 2018-10-31 ENCOUNTER — Ambulatory Visit (INDEPENDENT_AMBULATORY_CARE_PROVIDER_SITE_OTHER): Payer: Medicare Other

## 2018-10-31 VITALS — BP 116/82 | HR 60 | Temp 98.3°F | Ht 59.25 in | Wt 159.8 lb

## 2018-10-31 DIAGNOSIS — Z0000 Encounter for general adult medical examination without abnormal findings: Secondary | ICD-10-CM

## 2018-10-31 DIAGNOSIS — E785 Hyperlipidemia, unspecified: Secondary | ICD-10-CM | POA: Diagnosis not present

## 2018-10-31 DIAGNOSIS — E038 Other specified hypothyroidism: Secondary | ICD-10-CM | POA: Diagnosis not present

## 2018-10-31 LAB — LIPID PANEL
Cholesterol: 143 mg/dL (ref 0–200)
HDL: 50.1 mg/dL (ref >39.00)
LDL CALC: 74 mg/dL (ref 0–99)
NONHDL: 92.79
Total CHOL/HDL Ratio: 3
Triglycerides: 95 mg/dL (ref 0.0–149.0)
VLDL: 19 mg/dL (ref 0.0–40.0)

## 2018-10-31 LAB — TSH: TSH: 0.06 u[IU]/mL — AB (ref 0.35–4.50)

## 2018-10-31 LAB — COMPREHENSIVE METABOLIC PANEL
ALT: 19 U/L (ref 0–35)
AST: 19 U/L (ref 0–37)
Albumin: 3.7 g/dL (ref 3.5–5.2)
Alkaline Phosphatase: 58 U/L (ref 39–117)
BUN: 16 mg/dL (ref 6–23)
CHLORIDE: 108 meq/L (ref 96–112)
CO2: 29 mEq/L (ref 19–32)
Calcium: 9.7 mg/dL (ref 8.4–10.5)
Creatinine, Ser: 1.06 mg/dL (ref 0.40–1.20)
GFR: 54.09 mL/min — ABNORMAL LOW (ref >60.00)
GLUCOSE: 90 mg/dL (ref 70–99)
POTASSIUM: 4.3 meq/L (ref 3.5–5.1)
SODIUM: 141 meq/L (ref 135–145)
Total Bilirubin: 0.4 mg/dL (ref 0.2–1.2)
Total Protein: 6.4 g/dL (ref 6.0–8.3)

## 2018-10-31 NOTE — Patient Instructions (Signed)
Grace Ortiz , Thank you for taking time to come for your Medicare Wellness Visit. I appreciate your ongoing commitment to your health goals. Please review the following plan we discussed and let me know if I can assist you in the future.   These are the goals we discussed: Goals    . Patient Stated     Starting 10/31/2018, I will continue to take medications as prescribed.        This is a list of the screening recommended for you and due dates:  Health Maintenance  Topic Date Due  . Mammogram  06/29/2019  . Colon Cancer Screening  05/21/2024  . Tetanus Vaccine  06/01/2026  . Flu Shot  Completed  . DEXA scan (bone density measurement)  Completed  .  Hepatitis C: One time screening is recommended by Center for Disease Control  (CDC) for  adults born from [birth year] through 1965.   Completed  . Pneumonia vaccines  Completed   Preventive Care for Adults  A healthy lifestyle and preventive care can promote health and wellness. Preventive health guidelines for adults include the following key practices.  . A routine yearly physical is a good way to check with your health care provider about your health and preventive screening. It is a chance to share any concerns and updates on your health and to receive a thorough exam.  . Visit your dentist for a routine exam and preventive care every 6 months. Brush your teeth twice a day and floss once a day. Good oral hygiene prevents tooth decay and gum disease.  . The frequency of eye exams is based on your age, health, family medical history, use  of contact lenses, and other factors. Follow your health care provider's recommendations for frequency of eye exams.  . Eat a healthy diet. Foods like vegetables, fruits, whole grains, low-fat dairy products, and lean protein foods contain the nutrients you need without too many calories. Decrease your intake of foods high in solid fats, added sugars, and salt. Eat the right amount of calories for you. Get  information about a proper diet from your health care provider, if necessary.  . Regular physical exercise is one of the most important things you can do for your health. Most adults should get at least 150 minutes of moderate-intensity exercise (any activity that increases your heart rate and causes you to sweat) each week. In addition, most adults need muscle-strengthening exercises on 2 or more days a week.  Silver Sneakers may be a benefit available to you. To determine eligibility, you may visit the website: www.silversneakers.com or contact program at [phone] Mon-Fri between 8AM-8PM.   . Maintain a healthy weight. The body mass index (BMI) is a screening tool to identify possible weight problems. It provides an estimate of body fat based on height and weight. Your health care provider can find your BMI and can help you achieve or maintain a healthy weight.   For adults 20 years and older: ? A BMI below 18.5 is considered underweight. ? A BMI of 18.5 to 24.9 is normal. ? A BMI of 25 to 29.9 is considered overweight. ? A BMI of 30 and above is considered obese.   . Maintain normal blood lipids and cholesterol levels by exercising and minimizing your intake of saturated fat. Eat a balanced diet with plenty of fruit and vegetables. Blood tests for lipids and cholesterol should begin at age 41 and be repeated every 5 years. If your lipid or cholesterol  levels are high, you are over 50, or you are at high risk for heart disease, you may need your cholesterol levels checked more frequently. Ongoing high lipid and cholesterol levels should be treated with medicines if diet and exercise are not working.  . If you smoke, find out from your health care provider how to quit. If you do not use tobacco, please do not start.  . If you choose to drink alcohol, please do not consume more than 2 drinks per day. One drink is considered to be 12 ounces (355 mL) of beer, 5 ounces (148 mL) of wine, or 1.5  ounces (44 mL) of liquor.  . If you are 62-50 years old, ask your health care provider if you should take aspirin to prevent strokes.  . Use sunscreen. Apply sunscreen liberally and repeatedly throughout the day. You should seek shade when your shadow is shorter than you. Protect yourself by wearing long sleeves, pants, a wide-brimmed hat, and sunglasses year round, whenever you are outdoors.  . Once a month, do a whole body skin exam, using a mirror to look at the skin on your back. Tell your health care provider of new moles, moles that have irregular borders, moles that are larger than a pencil eraser, or moles that have changed in shape or color.

## 2018-11-04 NOTE — Progress Notes (Signed)
PCP notes:   Health maintenance:  No gaps identified.  Abnormal screenings:   Fall risk - hx of multiple falls Fall Risk  10/31/2018 10/31/2018 10/28/2017  Falls in the past year? 1 1 No  Comment 2 falls caused by tripping and losing balance; brusing  - -  Number falls in past yr: 1 1 -  Injury with Fall? 1 1 -  Risk for fall due to : - Impaired balance/gait;Impaired mobility -   Patient concerns:   None  Nurse concerns:  None  Next PCP appt:   11/08/18 @ 1500

## 2018-11-04 NOTE — Progress Notes (Signed)
Subjective:   Makyah Lavigne is a 73 y.o. female who presents for Medicare Annual (Subsequent) preventive examination.  Review of Systems:  N/A Cardiac Risk Factors include: advanced age (>93men, >41 women);obesity (BMI >30kg/m2);dyslipidemia     Objective:     Vitals: BP 116/82 (BP Location: Left Arm, Patient Position: Sitting, Cuff Size: Normal)   Pulse 60   Temp 98.3 F (36.8 C) (Oral)   Ht 4' 11.25" (1.505 m) Comment: no shoes  Wt 159 lb 12 oz (72.5 kg)   SpO2 94%   BMI 31.99 kg/m   Body mass index is 31.99 kg/m.  Advanced Directives 10/31/2018 08/02/2018 08/02/2018 10/28/2017  Does Patient Have a Medical Advance Directive? Yes No No Yes  Type of Paramedic of [place];Living will - - [place];Living will  Copy of [place] in Chart? No - copy requested - - No - copy requested  Would patient like information on creating a medical advance directive? - No - Patient declined No - Patient declined -    Tobacco Social History   Tobacco Use  Smoking Status Former Smoker  Smokeless Tobacco Never Used     Counseling given: No   Clinical Intake:  Pre-visit preparation completed: Yes  Pain : No/denies pain Pain Score: 0-No pain     Nutritional Status: BMI > 30  Obese Nutritional Risks: None  How often do you need to have someone help you when you read instructions, pamphlets, or other written materials from your doctor or pharmacy?: 1 - Never What is the last grade level you completed in school?: 12th grade + business school   Interpreter Needed?: No  Comments: pt lives with spouse Information entered by :: LPinson, LPN  Past Medical History:  Diagnosis Date  . Arthritis   . Bell's palsy   . Cervical polyp   . Diverticulitis   . Frequent headaches   . GERD (gastroesophageal reflux disease)   . Hyperlipidemia   . Hypothyroidism   . Macular retinal puckering, bilateral   . Migraines    Past  Surgical History:  Procedure Laterality Date  . BLEPHAROPLASTY  2008, 2009  . BREAST EXCISIONAL BIOPSY Left 05/1993   neg  . BREAST EXCISIONAL BIOPSY Left 09/1993   neg  . CATARACT EXTRACTION  2008  . CERVICAL POLYPECTOMY  2014  . HYSTEROSCOPY  2014   Family History  Problem Relation Age of Onset  . Breast cancer Maternal Grandmother 30  . Cancer Maternal Grandmother        breast  . Early death Maternal Grandmother   . Hearing loss Mother   . Heart disease Mother   . Cancer Father        colon  . Hearing loss Father   . Diabetes Sister   . Early death Paternal Grandfather    Social History   Socioeconomic History  . Marital status: Married    Spouse name: Not on file  . Number of children: Not on file  . Years of education: Not on file  . Highest education level: Not on file  Occupational History  . Not on file  Social Needs  . Financial resource strain: Not on file  . Food insecurity:    Worry: Not on file    Inability: Not on file  . Transportation needs:    Medical: Not on file    Non-medical: Not on file  Tobacco Use  . Smoking status: Former Research scientist (life sciences)  . Smokeless  tobacco: Never Used  Substance and Sexual Activity  . Alcohol use: No  . Drug use: No  . Sexual activity: Not on file  Lifestyle  . Physical activity:    Days per week: Not on file    Minutes per session: Not on file  . Stress: Not on file  Relationships  . Social connections:    Talks on phone: Not on file    Gets together: Not on file    Attends religious service: Not on file    Active member of club or organization: Not on file    Attends meetings of clubs or organizations: Not on file    Relationship status: Not on file  Other Topics Concern  . Not on file  Social History Narrative   Married.   No children.   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    Moved from [place].    Outpatient Encounter Medications as of 10/31/2018  Medication Sig  . Ascorbic Acid (VITAMIN C  PO) Take 1,000 Units by mouth daily.  Marland Kitchen aspirin EC 81 MG tablet Take 81 mg by mouth daily.  Marland Kitchen buPROPion (WELLBUTRIN XL) 300 MG 24 hr tablet Take 1 tablet (300 mg total) by mouth daily.  . Cholecalciferol (VITAMIN D3 PO) Take 1,200 mg by mouth daily.  . citalopram (CELEXA) 40 MG tablet Take 1 tablet (40 mg total) by mouth daily.  . Cyanocobalamin (VITAMIN B12 PO) Take 2,500 mcg by mouth daily.  . diazepam (VALIUM) 5 MG tablet Take 1 tablet (5 mg total) by mouth at bedtime as needed for anxiety.  Marland Kitchen levothyroxine (SYNTHROID, LEVOTHROID) 88 MCG tablet Take 1 tablet by mouth every morning on an empty stomach with a full glass of water. No food or other medications for 30 minutes.  . Omega-3 Fatty Acids (FISH OIL PO) Take 360 mg by mouth daily.  Marland Kitchen omeprazole (PRILOSEC) 20 MG capsule Take 1 capsule (20 mg total) by mouth daily.  . polyethylene glycol (MIRALAX / GLYCOLAX) packet Take 17 g by mouth daily.   . prochlorperazine (COMPAZINE) 10 MG tablet Take 1 tablet (10 mg total) by mouth every 6 (six) hours as needed for nausea or vomiting.  . simvastatin (ZOCOR) 10 MG tablet Take 1 tablet (10 mg total) by mouth every evening.  . Tretinoin Microsphere (RETIN-A MICRO PUMP) 0.06 % GEL 1 Pump every other day.    No facility-administered encounter medications on file as of 10/31/2018.     Activities of Daily Living In your present state of health, do you have any difficulty performing the following activities: 10/31/2018 08/02/2018  Hearing? Y N  Vision? N N  Difficulty concentrating or making decisions? N N  Walking or climbing stairs? N N  Dressing or bathing? N N  Doing errands, shopping? N N  Preparing Food and eating ? N -  Using the Toilet? N -  In the past six months, have you accidently leaked urine? N -  Do you have problems with loss of bowel control? N -  Managing your Medications? N -  Managing your Finances? N -  Housekeeping or managing your Housekeeping? N -  Some recent data might be  hidden    Patient Care Team: Pleas Koch, NP as PCP - General (Internal Medicine) Leandrew Koyanagi, MD as Referring Physician (Ophthalmology)    Assessment:   This is a routine wellness examination for [place].  Hearing Screening Comments: Bilateral hearing aids Vision Screening Comments: Vision exam in 2019  Exercise  Activities and Dietary recommendations Current Exercise Habits: The patient does not participate in regular exercise at present, Exercise limited by: None identified  Goals    . Patient Stated     Starting 10/31/2018, I will continue to take medications as prescribed.        Fall Risk Fall Risk  10/31/2018 10/31/2018 10/28/2017  Falls in the past year? 1 1 No  Comment 2 falls caused by tripping and losing balance; brusing  - -  Number falls in past yr: 1 1 -  Injury with Fall? 1 1 -  Risk for fall due to : - Impaired balance/gait;Impaired mobility -    Depression Screen PHQ 2/9 Scores 10/31/2018 10/31/2018 10/28/2017  PHQ - 2 Score 0 2 0  PHQ- 9 Score 0 5 3     Cognitive Function MMSE - Mini Mental State Exam 10/31/2018 10/28/2017  Orientation to time 5 5  Orientation to Place 5 5  Registration 3 3  Attention/ Calculation 0 0  Recall 3 3  Language- name 2 objects 0 0  Language- repeat 1 1  Language- follow 3 step command 3 3  Language- read & follow direction 0 0  Write a sentence 0 0  Copy design 0 0  Total score 20 20     PLEASE NOTE: A Mini-Cog screen was completed. Maximum score is 20. A value of 0 denotes this part of [place] MMSE was not completed or the patient failed this part of the Mini-Cog screening.   Mini-Cog Screening Orientation to Time - Max 5 pts Orientation to Place - Max 5 pts Registration - Max 3 pts Recall - Max 3 pts Language Repeat - Max 1 pts Language Follow 3 Step Command - Max 3 pts     Immunization History  Administered Date(s) Administered  . Influenza, High Dose Seasonal PF 07/29/2018  . Influenza,inj,Quad  PF,6+ Mos 06/30/2017  . Pneumococcal Conjugate-13 06/30/2012, 10/25/2013  . Pneumococcal Polysaccharide-23 07/27/2016  . Tdap 06/01/2016  . Zoster 02/10/2011   Screening Tests Health Maintenance  Topic Date Due  . MAMMOGRAM  06/29/2019  . COLONOSCOPY  05/21/2024  . TETANUS/TDAP  06/01/2026  . INFLUENZA VACCINE  Completed  . DEXA SCAN  Completed  . Hepatitis C Screening  Completed  . PNA vac Low Risk Adult  Completed      Plan:    I have personally reviewed, addressed, and noted the following in the patient's chart:  A. Medical and social history B. Use of alcohol, tobacco or illicit drugs  C. Current medications and supplements D. Functional ability and status E.  Nutritional status F.  Physical activity G. Advance directives H. List of other physicians I.  Hospitalizations, surgeries, and ER visits in previous 12 months J.  [place] to include hearing, vision, cognitive, depression L. Referrals and appointments - none  In addition, I have reviewed and discussed with patient certain preventive protocols, quality metrics, and best practice recommendations. A written personalized care plan for preventive services as well as general preventive health recommendations were provided to patient.  See attached scanned questionnaire for additional information.   Signed,   Lindell Noe, MHA, BS, LPN Health Coach

## 2018-11-08 ENCOUNTER — Encounter: Payer: Self-pay | Admitting: Primary Care

## 2018-11-08 ENCOUNTER — Ambulatory Visit (INDEPENDENT_AMBULATORY_CARE_PROVIDER_SITE_OTHER): Payer: Medicare Other | Admitting: Primary Care

## 2018-11-08 DIAGNOSIS — F419 Anxiety disorder, unspecified: Secondary | ICD-10-CM

## 2018-11-08 DIAGNOSIS — E039 Hypothyroidism, unspecified: Secondary | ICD-10-CM | POA: Diagnosis not present

## 2018-11-08 DIAGNOSIS — F329 Major depressive disorder, single episode, unspecified: Secondary | ICD-10-CM

## 2018-11-08 DIAGNOSIS — N289 Disorder of kidney and ureter, unspecified: Secondary | ICD-10-CM

## 2018-11-08 DIAGNOSIS — K219 Gastro-esophageal reflux disease without esophagitis: Secondary | ICD-10-CM

## 2018-11-08 DIAGNOSIS — E785 Hyperlipidemia, unspecified: Secondary | ICD-10-CM

## 2018-11-08 DIAGNOSIS — R519 Headache, unspecified: Secondary | ICD-10-CM

## 2018-11-08 DIAGNOSIS — R51 Headache: Secondary | ICD-10-CM | POA: Diagnosis not present

## 2018-11-08 NOTE — Assessment & Plan Note (Signed)
Recent LDL stable on simvastatin 10 mg.  Continue same.

## 2018-11-08 NOTE — Progress Notes (Signed)
Subjective:    Patient ID: Grace Ortiz, female    DOB: Feb 18, 1946, 73 y.o.   MRN: [id number]  HPI  Grace Ortiz is a 73 year old female [place] [address]. She saw our health advisor last week.   1) Hypothyroidism: Currently managed on levothyroxine 75 mcg which was just reduced several days ago from 88 mcg. TSH was 0.06 on the 88 mcg dose. She admits to taking her medication incorrectly when on the 75 mcg dose. She was hospitalized in October 2019 with TSH of 64 on levothyroxine 75 mcg.   Since then she is taking her levothyroxine every morning with water only. She is not eating or taking other medications for several hours.   2) Anxiety and Depression: Currently managed on Wellbutrin XL 300 mg, Celexa 40 mg, and diazepam 5 mg PRN. She was managed on diazepam prior to establishment in our office and has significantly reduced her use.   She continues to feel very tired, not sure if her medications are still working. She doesn't feel like doing certain things.   3) Hyperlipidemia: Currently managed on Simvastatin 10 mg. Her recent lipid panel shows LDL of 74.   4) Frequent Headaches: Chronic. Occur several day weekly. She will take Excedrin Migraine with improvement. She will take compazine infrequently for severe nausea as a side effect from headaches. Headaches are typically located to the temporal region, sometimes behind the sinuses. She is following with ophthalmology, next appointment is later this month.   BP Readings from Last 3 Encounters:  11/08/18 120/72  10/31/18 116/82  08/12/18 (!) 152/94     Immunizations: -Tetanus: Completed in 2017 -Influenza: Completed this season -Pneumonia:  Completed in 2017 -Shingles: Completed in 2012  Colonoscopy: Due in 2025 Dexa: Completed in January 2019 Mammogram: Completed in September 2019 Hep C Screen:   Review of Systems  Constitutional: Positive for fatigue.  Respiratory: Negative for shortness of breath.   Cardiovascular: Negative  for chest pain.  Neurological: Positive for headaches.  Psychiatric/Behavioral: Negative for suicidal ideas.       See HPI       Past Medical History:  Diagnosis Date  . Arthritis   . Bell's palsy   . Cervical polyp   . Diverticulitis   . Frequent headaches   . GERD (gastroesophageal reflux disease)   . Hyperlipidemia   . Hypothyroidism   . Macular retinal puckering, bilateral   . Migraines      Social History   Socioeconomic History  . Marital status: Married    Spouse name: Not on file  . Number of children: Not on file  . Years of education: Not on file  . Highest education level: Not on file  Occupational History  . Not on file  Social Needs  . Financial resource strain: Not on file  . Food insecurity:    Worry: Not on file    Inability: Not on file  . Transportation needs:    Medical: Not on file    Non-medical: Not on file  Tobacco Use  . Smoking status: Former Research scientist (life sciences)  . Smokeless tobacco: Never Used  Substance and Sexual Activity  . Alcohol use: No  . Drug use: No  . Sexual activity: Not on file  Lifestyle  . Physical activity:    Days per week: Not on file    Minutes per session: Not on file  . Stress: Not on file  Relationships  . Social connections:    Talks on phone: Not  on file    Gets together: Not on file    Attends religious service: Not on file    Active member of club or organization: Not on file    Attends meetings of clubs or organizations: Not on file    Relationship status: Not on file  . Intimate partner violence:    Fear of current or ex partner: Not on file    Emotionally abused: Not on file    Physically abused: Not on file    Forced sexual activity: Not on file  Other Topics Concern  . Not on file  Social History Narrative   Married.   No children.   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    Moved from [place].    Past Surgical History:  Procedure Laterality Date  . BLEPHAROPLASTY  2008, 2009  . BREAST  EXCISIONAL BIOPSY Left 05/1993   neg  . BREAST EXCISIONAL BIOPSY Left 09/1993   neg  . CATARACT EXTRACTION  2008  . CERVICAL POLYPECTOMY  2014  . HYSTEROSCOPY  2014    Family History  Problem Relation Age of Onset  . Breast cancer Maternal Grandmother 30  . Cancer Maternal Grandmother        breast  . Early death Maternal Grandmother   . Hearing loss Mother   . Heart disease Mother   . Cancer Father        colon  . Hearing loss Father   . Diabetes Sister   . Early death Paternal Grandfather     Allergies  Allergen Reactions  . Zoloft [Sertraline] Hypertension    Current Outpatient Medications on File Prior to Visit  Medication Sig Dispense Refill  . Ascorbic Acid (VITAMIN C PO) Take 1,000 Units by mouth daily.    Marland Kitchen aspirin EC 81 MG tablet Take 81 mg by mouth daily.    Marland Kitchen buPROPion (WELLBUTRIN XL) 300 MG 24 hr tablet Take 1 tablet (300 mg total) by mouth daily. 90 tablet 0  . Cholecalciferol (VITAMIN D3 PO) Take 1,200 mg by mouth daily.    . citalopram (CELEXA) 40 MG tablet Take 1 tablet (40 mg total) by mouth daily. 90 tablet 0  . Cyanocobalamin (VITAMIN B12 PO) Take 2,500 mcg by mouth daily.    . diazepam (VALIUM) 5 MG tablet Take 1 tablet (5 mg total) by mouth at bedtime as needed for anxiety. 30 tablet 0  . levothyroxine (SYNTHROID, LEVOTHROID) 75 MCG tablet Take 75 mcg by mouth daily before breakfast.    . Omega-3 Fatty Acids (FISH OIL PO) Take 360 mg by mouth daily.    Marland Kitchen omeprazole (PRILOSEC) 20 MG capsule Take 1 capsule (20 mg total) by mouth daily. 90 capsule 0  . polyethylene glycol (MIRALAX / GLYCOLAX) packet Take 17 g by mouth daily.     . prochlorperazine (COMPAZINE) 10 MG tablet Take 1 tablet (10 mg total) by mouth every 6 (six) hours as needed for nausea or vomiting. 30 tablet 0  . simvastatin (ZOCOR) 10 MG tablet Take 1 tablet (10 mg total) by mouth every evening. 90 tablet 0  . Tretinoin Microsphere (RETIN-A MICRO PUMP) 0.06 % GEL 1 Pump every other day.        No current facility-administered medications on file prior to visit.     BP 120/72   Pulse 69   Temp 98.6 F (37 C) (Oral)   Ht 4' 11.5" (1.511 m)   Wt 159 lb 4 oz (72.2 kg)   SpO2 98%  BMI 31.63 kg/m    Objective:   Physical Exam  Constitutional: She is oriented to person, place, and time. She appears well-nourished.  HENT:  Mouth/Throat: No oropharyngeal exudate.  Eyes: Pupils are equal, round, and reactive to light. EOM are normal.  Neck: Neck supple.  Cardiovascular: Normal rate and regular rhythm.  Respiratory: Effort normal and breath sounds normal.  GI: Soft. Bowel sounds are normal. There is no abdominal tenderness.  Musculoskeletal: Normal range of motion.  Neurological: She is alert and oriented to person, place, and time.  Skin: Skin is warm and dry.  Psychiatric: She has a normal mood and affect.           Assessment & Plan:

## 2018-11-08 NOTE — Patient Instructions (Signed)
Start exercising. You should be getting 150 minutes of moderate intensity exercise weekly.  It's important to improve your diet by reducing consumption of fast food, fried food, processed snack foods, sugary drinks. Increase consumption of fresh vegetables and fruits, whole grains, water.  Ensure you are drinking 64 ounces of water daily.  Please update me if you'd like to consider treatment for your daily headaches.  We can always change around your antidepressant medications if needed.  Continue taking levothyroxine 75 mcg daily as discussed.  Schedule a lab only appointment for 5 weeks to recheck your thyroid function.  It was a pleasure to see you today!

## 2018-11-08 NOTE — Assessment & Plan Note (Signed)
Noted on recent labs, also evident 1 year ago. History of acute kidney injury. She is currently not taking nephrotoxic agents including over-the-counter medications. Continue to monitor renal function.

## 2018-11-08 NOTE — Assessment & Plan Note (Signed)
Doing well on current regimen, continue same. 

## 2018-11-08 NOTE — Assessment & Plan Note (Signed)
Several times weekly, overall tolerable. Offered to treat with daily medication for prevention.  She currently declines and will update if she changes her mind.

## 2018-11-08 NOTE — Assessment & Plan Note (Signed)
Recent TSH too low, levothyroxine reduced to 75 mcg. She is now taking her medication appropriately, suspect inappropriate medication administration to be part of the cause her last hospital stay. Repeat thyroid function in 6 weeks.  She will update if she develops any of those symptoms that led up to her hospital visit in October 2019.

## 2018-11-08 NOTE — Assessment & Plan Note (Addendum)
Symptoms could be partially secondary to depression. Offered to change medication regimen, perhaps from Celexa to Lexapro or Effexor. She kindly declines for now, but will update if she changes her mind. She is using diazepam infrequently, commended her on this.

## 2018-11-09 NOTE — Progress Notes (Signed)
I reviewed health advisor's note, was available for consultation, and agree with documentation and plan.  

## 2018-11-12 DIAGNOSIS — K219 Gastro-esophageal reflux disease without esophagitis: Secondary | ICD-10-CM

## 2018-11-12 DIAGNOSIS — E785 Hyperlipidemia, unspecified: Secondary | ICD-10-CM

## 2018-11-12 DIAGNOSIS — F419 Anxiety disorder, unspecified: Principal | ICD-10-CM

## 2018-11-12 DIAGNOSIS — F329 Major depressive disorder, single episode, unspecified: Secondary | ICD-10-CM

## 2018-11-14 MED ORDER — SIMVASTATIN 10 MG PO TABS: 10.0000 mg | ORAL_TABLET | Freq: Every evening | ORAL | 3 refills | Status: DC

## 2018-11-14 MED ORDER — OMEPRAZOLE 20 MG PO CPDR: 20.0000 mg | DELAYED_RELEASE_CAPSULE | Freq: Every day | ORAL | 3 refills | Status: DC

## 2018-11-14 MED ORDER — CITALOPRAM HYDROBROMIDE 40 MG PO TABS: 40.0000 mg | ORAL_TABLET | Freq: Every day | ORAL | 3 refills | Status: DC

## 2018-11-14 MED ORDER — BUPROPION HCL ER (XL) 300 MG PO TB24: 300.0000 mg | ORAL_TABLET | Freq: Every day | ORAL | 3 refills | Status: DC

## 2018-11-18 DIAGNOSIS — H5000 Unspecified esotropia: Secondary | ICD-10-CM | POA: Diagnosis not present

## 2018-11-18 DIAGNOSIS — H40003 Preglaucoma, unspecified, bilateral: Secondary | ICD-10-CM | POA: Diagnosis not present

## 2018-12-08 ENCOUNTER — Other Ambulatory Visit: Payer: Self-pay | Admitting: Primary Care

## 2018-12-08 DIAGNOSIS — E039 Hypothyroidism, unspecified: Secondary | ICD-10-CM

## 2018-12-21 ENCOUNTER — Other Ambulatory Visit: Payer: Self-pay | Admitting: Primary Care

## 2018-12-21 DIAGNOSIS — E038 Other specified hypothyroidism: Secondary | ICD-10-CM

## 2018-12-21 DIAGNOSIS — N289 Disorder of kidney and ureter, unspecified: Secondary | ICD-10-CM

## 2018-12-28 ENCOUNTER — Other Ambulatory Visit: Payer: Self-pay

## 2018-12-28 ENCOUNTER — Other Ambulatory Visit (INDEPENDENT_AMBULATORY_CARE_PROVIDER_SITE_OTHER): Payer: Medicare Other

## 2018-12-28 DIAGNOSIS — E038 Other specified hypothyroidism: Secondary | ICD-10-CM | POA: Diagnosis not present

## 2018-12-28 DIAGNOSIS — N289 Disorder of kidney and ureter, unspecified: Secondary | ICD-10-CM

## 2018-12-28 LAB — BASIC METABOLIC PANEL
BUN: 19 mg/dL (ref 6–23)
CO2: 29 meq/L (ref 19–32)
Calcium: 10.3 mg/dL (ref 8.4–10.5)
Chloride: 104 mEq/L (ref 96–112)
Creatinine, Ser: 0.97 mg/dL (ref 0.40–1.20)
GFR: 56.35 mL/min — AB (ref >60.00)
GLUCOSE: 98 mg/dL (ref 70–99)
POTASSIUM: 4.2 meq/L (ref 3.5–5.1)
SODIUM: 139 meq/L (ref 135–145)

## 2018-12-28 LAB — TSH: TSH: 0.13 u[IU]/mL — ABNORMAL LOW (ref 0.35–4.50)

## 2018-12-30 ENCOUNTER — Other Ambulatory Visit (INDEPENDENT_AMBULATORY_CARE_PROVIDER_SITE_OTHER): Payer: Medicare Other

## 2018-12-30 DIAGNOSIS — E039 Hypothyroidism, unspecified: Secondary | ICD-10-CM

## 2018-12-30 LAB — T4, FREE: Free T4: 0.93 ng/dL (ref 0.60–1.60)

## 2019-01-04 DIAGNOSIS — E038 Other specified hypothyroidism: Secondary | ICD-10-CM

## 2019-01-05 MED ORDER — LEVOTHYROXINE SODIUM 50 MCG PO TABS: ORAL_TABLET | ORAL | 1 refills | Status: DC

## 2019-01-18 DIAGNOSIS — L570 Actinic keratosis: Secondary | ICD-10-CM | POA: Diagnosis not present

## 2019-01-18 DIAGNOSIS — X32XXXA Exposure to sunlight, initial encounter: Secondary | ICD-10-CM | POA: Diagnosis not present

## 2019-02-15 ENCOUNTER — Other Ambulatory Visit: Payer: Self-pay | Admitting: Primary Care

## 2019-02-15 DIAGNOSIS — E038 Other specified hypothyroidism: Secondary | ICD-10-CM

## 2019-02-17 ENCOUNTER — Telehealth: Payer: Self-pay

## 2019-02-17 NOTE — Telephone Encounter (Signed)
Left detailed VM w COVID screen and curbside info   

## 2019-02-21 ENCOUNTER — Other Ambulatory Visit: Payer: Self-pay | Admitting: Primary Care

## 2019-02-21 ENCOUNTER — Other Ambulatory Visit (INDEPENDENT_AMBULATORY_CARE_PROVIDER_SITE_OTHER): Payer: Medicare Other

## 2019-02-21 DIAGNOSIS — E038 Other specified hypothyroidism: Secondary | ICD-10-CM | POA: Diagnosis not present

## 2019-02-21 LAB — TSH: TSH: 2.1 u[IU]/mL (ref 0.35–4.50)

## 2019-03-02 ENCOUNTER — Other Ambulatory Visit: Payer: Self-pay | Admitting: Primary Care

## 2019-03-02 DIAGNOSIS — E038 Other specified hypothyroidism: Secondary | ICD-10-CM

## 2019-03-02 NOTE — Telephone Encounter (Signed)
Noted, refill sent to pharmacy. 

## 2019-03-02 NOTE — Telephone Encounter (Signed)
Last prescribed on 01/05/2019 . Last office visit on 11/08/2018 . Next future appointment on 11/02/2019.

## 2019-05-17 ENCOUNTER — Other Ambulatory Visit: Payer: Self-pay | Admitting: Primary Care

## 2019-05-17 DIAGNOSIS — Z1231 Encounter for screening mammogram for malignant neoplasm of breast: Secondary | ICD-10-CM

## 2019-05-23 ENCOUNTER — Other Ambulatory Visit (INDEPENDENT_AMBULATORY_CARE_PROVIDER_SITE_OTHER): Payer: Medicare Other

## 2019-05-23 DIAGNOSIS — E038 Other specified hypothyroidism: Secondary | ICD-10-CM | POA: Diagnosis not present

## 2019-05-23 LAB — TSH: TSH: 3.33 u[IU]/mL (ref 0.35–4.50)

## 2019-05-27 ENCOUNTER — Other Ambulatory Visit: Payer: Self-pay | Admitting: Primary Care

## 2019-05-27 DIAGNOSIS — E038 Other specified hypothyroidism: Secondary | ICD-10-CM

## 2019-06-08 DIAGNOSIS — L720 Epidermal cyst: Secondary | ICD-10-CM | POA: Diagnosis not present

## 2019-06-08 DIAGNOSIS — L738 Other specified follicular disorders: Secondary | ICD-10-CM | POA: Diagnosis not present

## 2019-06-08 DIAGNOSIS — L821 Other seborrheic keratosis: Secondary | ICD-10-CM | POA: Diagnosis not present

## 2019-06-09 DIAGNOSIS — Z23 Encounter for immunization: Secondary | ICD-10-CM | POA: Diagnosis not present

## 2019-06-13 NOTE — Telephone Encounter (Signed)
I have update the flu in patient's chart

## 2019-07-03 ENCOUNTER — Ambulatory Visit
Admission: RE | Admit: 2019-07-03 | Discharge: 2019-07-03 | Disposition: A | Discharge status: Home / Self Care | Payer: Medicare Other | Source: Ambulatory Visit | Attending: Primary Care | Admitting: Primary Care

## 2019-07-03 DIAGNOSIS — Z1231 Encounter for screening mammogram for malignant neoplasm of breast: Secondary | ICD-10-CM

## 2019-09-06 ENCOUNTER — Encounter: Payer: Self-pay | Admitting: Primary Care

## 2019-09-06 ENCOUNTER — Telehealth (INDEPENDENT_AMBULATORY_CARE_PROVIDER_SITE_OTHER): Payer: Medicare Other | Admitting: Primary Care

## 2019-09-06 VITALS — BP 131/92 | HR 75 | Temp 98.7°F | Wt 159.0 lb

## 2019-09-06 DIAGNOSIS — F419 Anxiety disorder, unspecified: Secondary | ICD-10-CM | POA: Diagnosis not present

## 2019-09-06 DIAGNOSIS — R519 Headache, unspecified: Secondary | ICD-10-CM

## 2019-09-06 DIAGNOSIS — G4700 Insomnia, unspecified: Secondary | ICD-10-CM | POA: Insufficient documentation

## 2019-09-06 DIAGNOSIS — R195 Other fecal abnormalities: Secondary | ICD-10-CM

## 2019-09-06 DIAGNOSIS — F329 Major depressive disorder, single episode, unspecified: Secondary | ICD-10-CM

## 2019-09-06 NOTE — Assessment & Plan Note (Signed)
Chronic and intermittent. Discussed to avoid diazepam use.  She will start with Melatonin and update. Consider low dose Trazodone.

## 2019-09-06 NOTE — Assessment & Plan Note (Signed)
Intermittent symptoms on citalopram 40 mg and bupropion XL 300 mg. Seems to be struggling more with insomnia/sleep.  Discussed options for treatment. We decided to work on her overall sleep first. Consider Trazodone but will have to be careful to avoid serotonin syndrome with already Celexa. Consider switching Celexa to Effexor as she's failed numerous other SSRI's.   She will start with adding Melatonin 10 mg nightly and update.

## 2019-09-06 NOTE — Patient Instructions (Signed)
Please update me if your stool symptoms return.  Add in Melatonin 10 mg once nightly for sleep. Take this one hour prior to bedtime.  Please update me if your headaches become more frequent as discussed.  It was a pleasure to see you today! Allie Bossier, NP-C

## 2019-09-06 NOTE — Progress Notes (Signed)
Subjective:    Patient ID: Grace Ortiz, female    DOB: 02/06/1946, 73 y.o.   MRN: [id number]  HPI  Virtual Visit via Video Note  I connected with Grace Ortiz on 09/06/19 at  2:40 PM EST by a video enabled telemedicine application and verified that I am speaking with the correct person using two identifiers.  Location: Patient: Home Provider: Office   I discussed the limitations of evaluation and management by telemedicine and the availability of in person appointments. The patient expressed understanding and agreed to proceed.  History of Present Illness:  Ms. Dicostanzo is a 73 year old female with a history of hypothyroidism, hyperlipidemia, anxiety and depression who presents today with a chief complaint of changes in stool. She would like to discuss difficulty sleeping, chronic depression, and headaches.  1) Stool Changes: About 10 days ago she began having loose, non watery, thin/formed stools. This stopped several days ago and then returned two days ago, then has since resolved. She denies abdominal pain, nausea, vomiting, bloody stools, diarrhea, recent changes in her diet, increased or decrease water intake. Overall she feels well. She's taken a few imodium over the last week which has helped.   2) Sleep Disturbance: Chronic with both difficulty falling and staying asleep. Symptoms are intermittent. She's only been managed on diazepam for which she takes very infrequently for insomnia. The other night she was "up all night". Denies increased anxiety but has noticed intermittent depression. She once tried Melatonin years ago for a short period of time. No recent OTC treatment.   3) Depression and Anxiety: Chronic for years. Over the last several months she's noticed symptoms of little interest and motivation to do things and feeling down. She's been on citalopram 40 mg and buproprion XL 300 mg for years. Once managed on Zoloft, Lexapro, and Prozac at different times without  improvement. She denies SI/HI.   4) Frequent Headaches: Chronic for years. No prior diagnosis of migraines. Headaches typically occur to the temporal and frontal regions which are mostly treatable with Excedrin Migraine. Headaches are overall once every 1-2 weeks, sometimes her headache will last for 24 hours despite use of Excedrin Migraine. She will sometimes have nausea with headaches, no photophobia or phonophobia. She has never been treated with daily preventative or abortive treatment.    Observations/Objective:  Alert and oriented. Appears well, not sickly. No distress. Speaking in complete sentences.   Assessment and Plan:  See problem based charting.  Follow Up Instructions:  Please update me if your stool symptoms return.  Add in Melatonin 10 mg once nightly for sleep. Take this one hour prior to bedtime.  Please update me if your headaches become more frequent as discussed.  It was a pleasure to see you today! Allie Bossier, NP-C    I discussed the assessment and treatment plan with the patient. The patient was provided an opportunity to ask questions and all were answered. The patient agreed with the plan and demonstrated an understanding of the instructions.   The patient was advised to call back or seek an in-person evaluation if the symptoms worsen or if the condition fails to improve as anticipated.    Pleas Koch, NP    Review of Systems  Gastrointestinal: Negative for abdominal pain, blood in stool, constipation, diarrhea, nausea and vomiting.  Neurological: Positive for headaches.       See HPI  Psychiatric/Behavioral: Positive for sleep disturbance.       See HPI  Past Medical History:  Diagnosis Date  . Acute UTI 08/02/2018  . Arthritis   . Bell's palsy   . Cervical polyp   . Diverticulitis   . Frequent headaches   . GERD (gastroesophageal reflux disease)   . Hyperlipidemia   . Hypothyroidism   . Macular retinal puckering,  bilateral   . Migraines      Social History   Socioeconomic History  . Marital status: Married    Spouse name: Not on file  . Number of children: Not on file  . Years of education: Not on file  . Highest education level: Not on file  Occupational History  . Not on file  Social Needs  . Financial resource strain: Not on file  . Food insecurity    Worry: Not on file    Inability: Not on file  . Transportation needs    Medical: Not on file    Non-medical: Not on file  Tobacco Use  . Smoking status: Former Research scientist (life sciences)  . Smokeless tobacco: Never Used  Substance and Sexual Activity  . Alcohol use: No  . Drug use: No  . Sexual activity: Not on file  Lifestyle  . Physical activity    Days per week: Not on file    Minutes per session: Not on file  . Stress: Not on file  Relationships  . Social Herbalist on phone: Not on file    Gets together: Not on file    Attends religious service: Not on file    Active member of club or organization: Not on file    Attends meetings of clubs or organizations: Not on file    Relationship status: Not on file  . Intimate partner violence    Fear of current or ex partner: Not on file    Emotionally abused: Not on file    Physically abused: Not on file    Forced sexual activity: Not on file  Other Topics Concern  . Not on file  Social History Narrative   Married.   No children.   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    Moved from [place].    Past Surgical History:  Procedure Laterality Date  . BLEPHAROPLASTY  2008, 2009  . BREAST EXCISIONAL BIOPSY Left 05/1993   neg  . BREAST EXCISIONAL BIOPSY Left 09/1993   neg  . CATARACT EXTRACTION  2008  . CERVICAL POLYPECTOMY  2014  . HYSTEROSCOPY  2014    Family History  Problem Relation Age of Onset  . Breast cancer Maternal Grandmother 30  . Cancer Maternal Grandmother        breast  . Early death Maternal Grandmother   . Hearing loss Mother   . Heart disease  Mother   . Cancer Father        colon  . Hearing loss Father   . Diabetes Sister   . Early death Paternal Grandfather     No Known Allergies  Current Outpatient Medications on File Prior to Visit  Medication Sig Dispense Refill  . Ascorbic Acid (VITAMIN C PO) Take 1,000 Units by mouth daily.    Marland Kitchen aspirin EC 81 MG tablet Take 81 mg by mouth daily.    Marland Kitchen buPROPion (WELLBUTRIN XL) 300 MG 24 hr tablet Take 1 tablet (300 mg total) by mouth daily. For anxiety/depression. 90 tablet 3  . Cholecalciferol (VITAMIN D3 PO) Take 1,200 mg by mouth daily.    . citalopram (CELEXA) 40 MG tablet  Take 1 tablet (40 mg total) by mouth daily. For anxiety/depression. 90 tablet 3  . Cyanocobalamin (VITAMIN B12 PO) Take 2,500 mcg by mouth daily.    . diazepam (VALIUM) 5 MG tablet Take 1 tablet (5 mg total) by mouth at bedtime as needed for anxiety. 30 tablet 0  . levothyroxine (SYNTHROID) 50 MCG tablet TAKE 1 TABLET BY MOUTH EVERY MORNING ON EMPTY STOMACH WITH WATER, NO FOOD OR OTHER MEDS FOR 30 MIN. 90 tablet 2  . Omega-3 Fatty Acids (FISH OIL PO) Take 360 mg by mouth daily.    Marland Kitchen omeprazole (PRILOSEC) 20 MG capsule Take 1 capsule (20 mg total) by mouth daily. For heartburn. 90 capsule 3  . polyethylene glycol (MIRALAX / GLYCOLAX) packet Take 17 g by mouth daily.     . prochlorperazine (COMPAZINE) 10 MG tablet Take 1 tablet (10 mg total) by mouth every 6 (six) hours as needed for nausea or vomiting. 30 tablet 0  . simvastatin (ZOCOR) 10 MG tablet Take 1 tablet (10 mg total) by mouth every evening. For cholesterol. 90 tablet 3  . Tretinoin Microsphere (RETIN-A MICRO PUMP) 0.06 % GEL 1 Pump every other day.      No current facility-administered medications on file prior to visit.     BP (!) 131/92   Pulse 75   Temp 98.7 F (37.1 C) (Temporal)   Wt 159 lb (72.1 kg)   BMI 31.58 kg/m    Objective:   Physical Exam  Constitutional: She is oriented to person, place, and time. She appears well-nourished.   Respiratory: Effort normal.  Neurological: She is alert and oriented to person, place, and time.  Psychiatric: She has a normal mood and affect.           Assessment & Plan:  Stool Changes:  Unclear etiolgoy, symptoms have completely resolved. Discussed to notify if she develops diarrhea, bloody stools, abdominal pain, nausea, etc. Add fiber and water into diet for bulk.  She will update.  Pleas Koch, NP

## 2019-09-06 NOTE — Assessment & Plan Note (Signed)
Overall seem to be occurring twice monthly. Discussed to notify if headaches occur more frequently, and/or if her 24 hour headaches become more frequent.  Consider daily preventative treatment if needed. Discussed abortive treatment with Imitrex, she will think about this.

## 2019-09-13 DIAGNOSIS — R195 Other fecal abnormalities: Secondary | ICD-10-CM

## 2019-09-18 NOTE — Telephone Encounter (Signed)
Left message asking pt to call office  °

## 2019-09-19 ENCOUNTER — Other Ambulatory Visit (INDEPENDENT_AMBULATORY_CARE_PROVIDER_SITE_OTHER): Payer: Medicare Other

## 2019-09-19 ENCOUNTER — Other Ambulatory Visit: Payer: Self-pay

## 2019-09-19 DIAGNOSIS — R195 Other fecal abnormalities: Secondary | ICD-10-CM | POA: Diagnosis not present

## 2019-09-19 DIAGNOSIS — R194 Change in bowel habit: Secondary | ICD-10-CM | POA: Diagnosis not present

## 2019-09-19 LAB — CBC WITH DIFFERENTIAL/PLATELET
Basophils Absolute: 0 10*3/uL (ref 0.0–0.1)
Basophils Relative: 0.6 % (ref 0.0–3.0)
Eosinophils Absolute: 0.1 10*3/uL (ref 0.0–0.7)
Eosinophils Relative: 2.2 % (ref 0.0–5.0)
HCT: 43.1 % (ref 36.0–46.0)
Hemoglobin: 14.4 g/dL (ref 12.0–15.0)
Lymphocytes Relative: 21 % (ref 12.0–46.0)
Lymphs Abs: 1.1 10*3/uL (ref 0.7–4.0)
MCHC: 33.3 g/dL (ref 30.0–36.0)
MCV: 97.6 fl (ref 78.0–100.0)
Monocytes Absolute: 0.3 10*3/uL (ref 0.1–1.0)
Monocytes Relative: 6.4 % (ref 3.0–12.0)
Neutro Abs: 3.7 10*3/uL (ref 1.4–7.7)
Neutrophils Relative %: 69.8 % (ref 43.0–77.0)
Platelets: 190 10*3/uL (ref 150.0–400.0)
RBC: 4.42 Mil/uL (ref 3.87–5.11)
RDW: 13.7 % (ref 11.5–15.5)
WBC: 5.3 10*3/uL (ref 4.0–10.5)

## 2019-09-20 LAB — GASTROINTESTINAL PATHOGEN PANEL PCR
C. difficile Tox A/B, PCR: NOT DETECTED
Campylobacter, PCR: NOT DETECTED
Cryptosporidium, PCR: NOT DETECTED
E coli (ETEC) LT/ST PCR: NOT DETECTED
E coli (STEC) stx1/stx2, PCR: NOT DETECTED
E coli 0157, PCR: NOT DETECTED
Giardia lamblia, PCR: NOT DETECTED
Norovirus, PCR: NOT DETECTED
Rotavirus A, PCR: NOT DETECTED
Salmonella, PCR: NOT DETECTED
Shigella, PCR: NOT DETECTED

## 2019-09-21 DIAGNOSIS — Z1211 Encounter for screening for malignant neoplasm of colon: Secondary | ICD-10-CM

## 2019-09-21 DIAGNOSIS — R195 Other fecal abnormalities: Secondary | ICD-10-CM

## 2019-11-02 ENCOUNTER — Other Ambulatory Visit: Payer: Self-pay | Admitting: Primary Care

## 2019-11-02 ENCOUNTER — Ambulatory Visit (INDEPENDENT_AMBULATORY_CARE_PROVIDER_SITE_OTHER): Payer: Medicare Other

## 2019-11-02 ENCOUNTER — Other Ambulatory Visit (INDEPENDENT_AMBULATORY_CARE_PROVIDER_SITE_OTHER): Payer: Medicare Other

## 2019-11-02 ENCOUNTER — Ambulatory Visit: Payer: Medicare Other

## 2019-11-02 ENCOUNTER — Other Ambulatory Visit: Payer: Self-pay

## 2019-11-02 DIAGNOSIS — E038 Other specified hypothyroidism: Secondary | ICD-10-CM

## 2019-11-02 DIAGNOSIS — N289 Disorder of kidney and ureter, unspecified: Secondary | ICD-10-CM

## 2019-11-02 DIAGNOSIS — E785 Hyperlipidemia, unspecified: Secondary | ICD-10-CM | POA: Diagnosis not present

## 2019-11-02 DIAGNOSIS — Z0000 Encounter for general adult medical examination without abnormal findings: Secondary | ICD-10-CM

## 2019-11-02 LAB — LIPID PANEL
Cholesterol: 173 mg/dL (ref 0–200)
HDL: 64 mg/dL (ref >39.00)
LDL Cholesterol: 91 mg/dL (ref 0–99)
NonHDL: 108.76
Total CHOL/HDL Ratio: 3
Triglycerides: 90 mg/dL (ref 0.0–149.0)
VLDL: 18 mg/dL (ref 0.0–40.0)

## 2019-11-02 LAB — COMPREHENSIVE METABOLIC PANEL
ALT: 18 U/L (ref 0–35)
AST: 20 U/L (ref 0–37)
Albumin: 4.1 g/dL (ref 3.5–5.2)
Alkaline Phosphatase: 70 U/L (ref 39–117)
BUN: 16 mg/dL (ref 6–23)
CO2: 30 mEq/L (ref 19–32)
Calcium: 10.1 mg/dL (ref 8.4–10.5)
Chloride: 104 mEq/L (ref 96–112)
Creatinine, Ser: 1.19 mg/dL (ref 0.40–1.20)
GFR: 44.41 mL/min — ABNORMAL LOW (ref >60.00)
Glucose, Bld: 88 mg/dL (ref 70–99)
Potassium: 5.1 mEq/L (ref 3.5–5.1)
Sodium: 140 mEq/L (ref 135–145)
Total Bilirubin: 0.5 mg/dL (ref 0.2–1.2)
Total Protein: 6.7 g/dL (ref 6.0–8.3)

## 2019-11-02 LAB — TSH: TSH: 14.48 u[IU]/mL — ABNORMAL HIGH (ref 0.35–4.50)

## 2019-11-02 LAB — CBC
HCT: 43.5 % (ref 36.0–46.0)
Hemoglobin: 14.4 g/dL (ref 12.0–15.0)
MCHC: 33.2 g/dL (ref 30.0–36.0)
MCV: 97.8 fl (ref 78.0–100.0)
Platelets: 207 10*3/uL (ref 150.0–400.0)
RBC: 4.45 Mil/uL (ref 3.87–5.11)
RDW: 13.7 % (ref 11.5–15.5)
WBC: 4.8 10*3/uL (ref 4.0–10.5)

## 2019-11-02 NOTE — Progress Notes (Signed)
Subjective:   Munisa Roulo is a 74 y.o. female who presents for Medicare Annual (Subsequent) preventive examination.  Review of Systems: N/A   This visit is being conducted through telemedicine via telephone at the nurse health advisor's home address due to the COVID-19 pandemic. This patient has given me verbal consent via doximity to conduct this visit, patient states they are participating from their home address. Patient and myself are on the telephone call. There is no referral for this visit. Some vital signs may be absent or patient reported.    Patient identification: identified by name, DOB, and current address   Cardiac Risk Factors include: advanced age (>23men, >44 women);dyslipidemia     Objective:     Vitals: There were no vitals taken for this visit.  There is no height or weight on file to calculate BMI.  Advanced Directives 11/02/2019 10/31/2018 08/02/2018 08/02/2018 10/28/2017  Does Patient Have a Medical Advance Directive? Yes Yes No No Yes  Type of Paramedic of [place];Living will [place];Living will - - [place];Living will  Copy of [place] in Chart? No - copy requested No - copy requested - - No - copy requested  Would patient like information on creating a medical advance directive? - - No - Patient declined No - Patient declined -    Tobacco Social History   Tobacco Use  Smoking Status Former Smoker  Smokeless Tobacco Never Used     Counseling given: Not Answered   Clinical Intake:  Pre-visit preparation completed: Yes  Pain : No/denies pain     Nutritional Risks: None Diabetes: No  How often do you need to have someone help you when you read instructions, pamphlets, or other written materials from your doctor or pharmacy?: 1 - Never What is the last grade level you completed in school?: 12th  Interpreter Needed?: No  Information entered by :: CJohnson,  LPN  Past Medical History:  Diagnosis Date  . Acute UTI 08/02/2018  . Arthritis   . Bell's palsy   . Cervical polyp   . Diverticulitis   . Frequent headaches   . GERD (gastroesophageal reflux disease)   . Hyperlipidemia   . Hypothyroidism   . Macular retinal puckering, bilateral   . Migraines    Past Surgical History:  Procedure Laterality Date  . BLEPHAROPLASTY  2008, 2009  . BREAST EXCISIONAL BIOPSY Left 05/1993   neg  . BREAST EXCISIONAL BIOPSY Left 09/1993   neg  . CATARACT EXTRACTION  2008  . CERVICAL POLYPECTOMY  2014  . HYSTEROSCOPY  2014   Family History  Problem Relation Age of Onset  . Breast cancer Maternal Grandmother 30  . Cancer Maternal Grandmother        breast  . Early death Maternal Grandmother   . Hearing loss Mother   . Heart disease Mother   . Cancer Father        colon  . Hearing loss Father   . Diabetes Sister   . Early death Paternal Grandfather    Social History   Socioeconomic History  . Marital status: Married    Spouse name: Not on file  . Number of children: Not on file  . Years of education: Not on file  . Highest education level: Not on file  Occupational History  . Not on file  Tobacco Use  . Smoking status: Former Research scientist (life sciences)  . Smokeless tobacco: Never Used  Substance and Sexual  Activity  . Alcohol use: No  . Drug use: No  . Sexual activity: Not on file  Other Topics Concern  . Not on file  Social History Narrative   Married.   No children.   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    Moved from [place].   Social Determinants of Health   Financial Resource Strain: Low Risk   . Difficulty of Paying Living Expenses: Not hard at all  Food Insecurity: No Food Insecurity  . Worried About Charity fundraiser in the Last Year: Never true  . Ran Out of Food in the Last Year: Never true  Transportation Needs: No Transportation Needs  . Lack of Transportation (Medical): No  . Lack of Transportation (Non-Medical):  No  Physical Activity: Inactive  . Days of Exercise per Week: 0 days  . Minutes of Exercise per Session: 0 min  Stress: No Stress Concern Present  . Feeling of Stress : Not at all  Social Connections:   . Frequency of Communication with Friends and Family: Not on file  . Frequency of Social Gatherings with Friends and Family: Not on file  . Attends Religious Services: Not on file  . Active Member of Clubs or Organizations: Not on file  . Attends Archivist Meetings: Not on file  . Marital Status: Not on file    Outpatient Encounter Medications as of 11/02/2019  Medication Sig  . Ascorbic Acid (VITAMIN C PO) Take 1,000 Units by mouth daily.  Marland Kitchen aspirin EC 81 MG tablet Take 81 mg by mouth daily.  Marland Kitchen buPROPion (WELLBUTRIN XL) 300 MG 24 hr tablet Take 1 tablet (300 mg total) by mouth daily. For anxiety/depression.  . Cholecalciferol (VITAMIN D3 PO) Take 1,200 mg by mouth daily.  . citalopram (CELEXA) 40 MG tablet Take 1 tablet (40 mg total) by mouth daily. For anxiety/depression.  . Cyanocobalamin (VITAMIN B12 PO) Take 2,500 mcg by mouth daily.  . diazepam (VALIUM) 5 MG tablet Take 1 tablet (5 mg total) by mouth at bedtime as needed for anxiety.  Marland Kitchen levothyroxine (SYNTHROID) 50 MCG tablet TAKE 1 TABLET BY MOUTH EVERY MORNING ON EMPTY STOMACH WITH WATER, NO FOOD OR OTHER MEDS FOR 30 MIN.  Marland Kitchen Omega-3 Fatty Acids (FISH OIL PO) Take 360 mg by mouth daily.  Marland Kitchen omeprazole (PRILOSEC) 20 MG capsule Take 1 capsule (20 mg total) by mouth daily. For heartburn.  . polyethylene glycol (MIRALAX / GLYCOLAX) packet Take 17 g by mouth daily.   . prochlorperazine (COMPAZINE) 10 MG tablet Take 1 tablet (10 mg total) by mouth every 6 (six) hours as needed for nausea or vomiting.  . simvastatin (ZOCOR) 10 MG tablet Take 1 tablet (10 mg total) by mouth every evening. For cholesterol.  . Tretinoin Microsphere (RETIN-A MICRO PUMP) 0.06 % GEL 1 Pump every other day.    No facility-administered encounter  medications on file as of 11/02/2019.    Activities of Daily Living In your present state of health, do you have any difficulty performing the following activities: 11/02/2019  Hearing? Y  Comment wears hearing aids  Vision? N  Difficulty concentrating or making decisions? N  Walking or climbing stairs? N  Dressing or bathing? N  Doing errands, shopping? N  Preparing Food and eating ? N  Using the Toilet? N  In the past six months, have you accidently leaked urine? N  Do you have problems with loss of bowel control? N  Managing your Medications? N  Managing  your Finances? N  Housekeeping or managing your Housekeeping? N  Some recent data might be hidden    Patient Care Team: Pleas Koch, NP as PCP - General (Internal Medicine) Leandrew Koyanagi, MD as Referring Physician (Ophthalmology)    Assessment:   This is a routine wellness examination for [place].  Exercise Activities and Dietary recommendations Current Exercise Habits: Home exercise routine, Type of exercise: walking, Time (Minutes): 20, Frequency (Times/Week): 7, Weekly Exercise (Minutes/Week): 140, Intensity: Mild, Exercise limited by: None identified  Goals    . Patient Stated     Starting 10/31/2018, I will continue to take medications as prescribed.     . Patient Stated     11/02/2019, I will maintain and continue medications as prescribed.        Fall Risk Fall Risk  11/02/2019 10/31/2018 10/31/2018 10/28/2017  Falls in the past year? 0 1 1 No  Comment - 2 falls caused by tripping and losing balance; brusing  - -  Number falls in past yr: 0 1 1 -  Injury with Fall? 0 1 1 -  Risk for fall due to : Medication side effect - Impaired balance/gait;Impaired mobility -  Follow up Falls evaluation completed;Falls prevention discussed - - -   Is the patient's home free of loose throw rugs in walkways, pet beds, electrical cords, etc?   yes      Grab bars in the bathroom? yes      Handrails on the stairs?    yes      Adequate lighting?   yes  Timed Get Up and Go performed: N/A  Depression Screen PHQ 2/9 Scores 11/02/2019 10/31/2018 10/31/2018 10/28/2017  PHQ - 2 Score 1 0 2 0  PHQ- 9 Score 1 0 5 3     Cognitive Function MMSE - Mini Mental State Exam 11/02/2019 10/31/2018 10/28/2017  Orientation to time 5 5 5   Orientation to Place 5 5 5   Registration 3 3 3   Attention/ Calculation 5 0 0  Recall 3 3 3   Language- name 2 objects - 0 0  Language- repeat 1 1 1   Language- follow 3 step command - 3 3  Language- read & follow direction - 0 0  Write a sentence - 0 0  Copy design - 0 0  Total score - 20 20  Mini Cog  Mini-Cog screen was completed. Maximum score is 22. A value of 0 denotes this part of the MMSE was not completed or the patient failed this part of the Mini-Cog screening.       Immunization History  Administered Date(s) Administered  . Influenza Split 08/06/2009  . Influenza, High Dose Seasonal PF 08/31/2012, 07/26/2013, 08/01/2014, 07/31/2015, 08/05/2016, 07/29/2018, 06/09/2019  . Influenza,inj,Quad PF,6+ Mos 06/30/2017  . Influenza-Unspecified 07/29/2018, 06/09/2019  . Pneumococcal Conjugate-13 06/30/2012, 10/25/2013  . Pneumococcal Polysaccharide-23 09/07/2012, 07/27/2016  . Tdap 10/29/2015, 06/01/2016  . Zoster 02/11/2011    Qualifies for Shingles Vaccine? yes  Screening Tests Health Maintenance  Topic Date Due  . MAMMOGRAM  07/02/2020  . COLONOSCOPY  05/21/2024  . TETANUS/TDAP  06/01/2026  . INFLUENZA VACCINE  Completed  . DEXA SCAN  Completed  . Hepatitis C Screening  Completed  . PNA vac Low Risk Adult  Completed    Cancer Screenings: Lung: Low Dose CT Chest recommended if Age 77-80 years, 30 pack-year currently smoking OR have quit w/in 15years. Patient does not qualify. Breast:  Up to date on Mammogram? Yes, completed 07/03/2019   Up to  date of Bone Density/Dexa? Yes, completed 11/11/2017 Colorectal: completed 05/21/2014  Additional Screenings:  Hepatitis  C Screening: 10/20/2015     Plan:    Patient will maintain and continue medications as prescribed.    I have personally reviewed and noted the following in the patient's chart:   . Medical and social history . Use of alcohol, tobacco or illicit drugs  . Current medications and supplements . Functional ability and status . Nutritional status . Physical activity . Advanced directives . List of other physicians . Hospitalizations, surgeries, and ER visits in previous 12 months . Vitals . Screenings to include cognitive, depression, and falls . Referrals and appointments  In addition, I have reviewed and discussed with patient certain preventive protocols, quality metrics, and best practice recommendations. A written personalized care plan for preventive services as well as general preventive health recommendations were provided to patient.     Andrez Grime, LPN  [id number]

## 2019-11-02 NOTE — Progress Notes (Signed)
PCP notes:  Health Maintenance: No gaps noted   Abnormal Screenings: none   Patient concerns: none   Nurse concerns: none    Next PCP appt.: 11/10/2019 @ 11:40 am

## 2019-11-02 NOTE — Patient Instructions (Signed)
Grace Ortiz , Thank you for taking time to come for your Medicare Wellness Visit. I appreciate your ongoing commitment to your health goals. Please review the following plan we discussed and let me know if I can assist you in the future.   Screening recommendations/referrals: Colonoscopy: Up to date, completed 05/21/2014 Mammogram: Up to date, completed 07/03/2019 Bone Density: Up to date, completed 11/11/2017 Recommended yearly ophthalmology/optometry visit for glaucoma screening and checkup Recommended yearly dental visit for hygiene and checkup  Vaccinations: Influenza vaccine: Up to date, completed 06/09/2019 Pneumococcal vaccine: Completed series Tdap vaccine: Up to date, completed 06/01/2016 Shingles vaccine: discussed    Advanced directives: Please bring a copy of your POA (Power of [place]) and/or Living Will to your next appointment.   Conditions/risks identified: hyperlipidemia  Next appointment: 11/10/2019 @ 11:40 am    Preventive Care 65 Years and Older, Female Preventive care refers to lifestyle choices and visits with your health care provider that can promote health and wellness. What does preventive care include?  A yearly physical exam. This is also called an annual well check.  Dental exams once or twice a year.  Routine eye exams. Ask your health care provider how often you should have your eyes checked.  Personal lifestyle choices, including:  Daily care of your teeth and gums.  Regular physical activity.  Eating a healthy diet.  Avoiding tobacco and drug use.  Limiting alcohol use.  Practicing safe sex.  Taking low-dose aspirin every day.  Taking vitamin and mineral supplements as recommended by your health care provider. What happens during an annual well check? The services and screenings done by your health care provider during your annual well check will depend on your age, overall health, lifestyle risk factors, and family history of  disease. Counseling  Your health care provider may ask you questions about your:  Alcohol use.  Tobacco use.  Drug use.  Emotional well-being.  Home and relationship well-being.  Sexual activity.  Eating habits.  History of falls.  Memory and ability to understand (cognition).  Work and work Statistician.  Reproductive health. Screening  You may have the following tests or measurements:  Height, weight, and BMI.  Blood pressure.  Lipid and cholesterol levels. These may be checked every 5 years, or more frequently if you are over 49 years old.  Skin check.  Lung cancer screening. You may have this screening every year starting at age 37 if you have a 30-pack-year history of smoking and currently smoke or have quit within the past 15 years.  Fecal occult blood test (FOBT) of the stool. You may have this test every year starting at age 86.  Flexible sigmoidoscopy or colonoscopy. You may have a sigmoidoscopy every 5 years or a colonoscopy every 10 years starting at age 64.  Hepatitis C blood test.  Hepatitis B blood test.  Sexually transmitted disease (STD) testing.  Diabetes screening. This is done by checking your blood sugar (glucose) after you have not eaten for a while (fasting). You may have this done every 1-3 years.  Bone density scan. This is done to screen for osteoporosis. You may have this done starting at age 82.  Mammogram. This may be done every 1-2 years. Talk to your health care provider about how often you should have regular mammograms. Talk with your health care provider about your test results, treatment options, and if necessary, the need for more tests. Vaccines  Your health care provider may recommend certain vaccines, such as:  Influenza vaccine. This is recommended every year.  Tetanus, diphtheria, and acellular pertussis (Tdap, Td) vaccine. You may need a Td booster every 10 years.  Zoster vaccine. You may need this after age  7.  Pneumococcal 13-valent conjugate (PCV13) vaccine. One dose is recommended after age 54.  Pneumococcal polysaccharide (PPSV23) vaccine. One dose is recommended after age 41. Talk to your health care provider about which screenings and vaccines you need and how often you need them. This information is not intended to replace advice given to you by your health care provider. Make sure you discuss any questions you have with your health care provider. Document Released: 11/01/2015 Document Revised: 06/24/2016 Document Reviewed: 08/06/2015 Elsevier Interactive Patient Education  2017 [place] Prevention in the Home Falls can cause injuries. They can happen to people of all ages. There are many things you can do to make your home safe and to help prevent falls. What can I do on the outside of my home?  Regularly fix the edges of walkways and driveways and fix any cracks.  Remove anything that might make you trip as you walk through a door, such as a raised step or threshold.  Trim any bushes or trees on the path to your home.  Use bright outdoor lighting.  Clear any walking paths of anything that might make someone trip, such as rocks or tools.  Regularly check to see if handrails are loose or broken. Make sure that both sides of any steps have handrails.  Any raised decks and porches should have guardrails on the edges.  Have any leaves, snow, or ice cleared regularly.  Use sand or salt on walking paths during winter.  Clean up any spills in your garage right away. This includes oil or grease spills. What can I do in the bathroom?  Use night lights.  Install grab bars by the toilet and in the tub and shower. Do not use towel bars as grab bars.  Use non-skid mats or decals in the tub or shower.  If you need to sit down in the shower, use a plastic, non-slip stool.  Keep the floor dry. Clean up any water that spills on the floor as soon as it happens.  Remove  soap buildup in the tub or shower regularly.  Attach bath mats securely with double-sided non-slip rug tape.  Do not have throw rugs and other things on the floor that can make you trip. What can I do in the bedroom?  Use night lights.  Make sure that you have a light by your bed that is easy to reach.  Do not use any sheets or blankets that are too big for your bed. They should not hang down onto the floor.  Have a firm chair that has side arms. You can use this for support while you get dressed.  Do not have throw rugs and other things on the floor that can make you trip. What can I do in the kitchen?  Clean up any spills right away.  Avoid walking on wet floors.  Keep items that you use a lot in easy-to-reach places.  If you need to reach something above you, use a strong step stool that has a grab bar.  Keep electrical cords out of the way.  Do not use floor polish or wax that makes floors slippery. If you must use wax, use non-skid floor wax.  Do not have throw rugs and other things on the floor that  can make you trip. What can I do with my stairs?  Do not leave any items on the stairs.  Make sure that there are handrails on both sides of the stairs and use them. Fix handrails that are broken or loose. Make sure that handrails are as long as the stairways.  Check any carpeting to make sure that it is firmly attached to the stairs. Fix any carpet that is loose or worn.  Avoid having throw rugs at the top or bottom of the stairs. If you do have throw rugs, attach them to the floor with carpet tape.  Make sure that you have a light switch at the top of the stairs and the bottom of the stairs. If you do not have them, ask someone to add them for you. What else can I do to help prevent falls?  Wear shoes that:  Do not have high heels.  Have rubber bottoms.  Are comfortable and fit you well.  Are closed at the toe. Do not wear sandals.  If you use a  stepladder:  Make sure that it is fully opened. Do not climb a closed stepladder.  Make sure that both sides of the stepladder are locked into place.  Ask someone to hold it for you, if possible.  Clearly mark and make sure that you can see:  Any grab bars or handrails.  First and last steps.  Where the edge of each step is.  Use tools that help you move around (mobility aids) if they are needed. These include:  Canes.  Walkers.  Scooters.  Crutches.  Turn on the lights when you go into a dark area. Replace any light bulbs as soon as they burn out.  Set up your furniture so you have a clear path. Avoid moving your furniture around.  If any of your floors are uneven, fix them.  If there are any pets around you, be aware of where they are.  Review your medicines with your doctor. Some medicines can make you feel dizzy. This can increase your chance of falling. Ask your doctor what other things that you can do to help prevent falls. This information is not intended to replace advice given to you by your health care provider. Make sure you discuss any questions you have with your health care provider. Document Released: 08/01/2009 Document Revised: 03/12/2016 Document Reviewed: 11/09/2014 Elsevier Interactive Patient Education  2017 [employer].

## 2019-11-07 DIAGNOSIS — E038 Other specified hypothyroidism: Secondary | ICD-10-CM

## 2019-11-07 MED ORDER — LEVOTHYROXINE SODIUM 50 MCG PO TABS: ORAL_TABLET | ORAL | 3 refills | Status: DC

## 2019-11-08 MED ORDER — LEVOTHYROXINE SODIUM 50 MCG PO TABS: ORAL_TABLET | ORAL | 1 refills | Status: DC

## 2019-11-08 MED ORDER — LEVOTHYROXINE SODIUM 75 MCG PO TABS: ORAL_TABLET | ORAL | 1 refills | Status: DC

## 2019-11-10 ENCOUNTER — Ambulatory Visit (INDEPENDENT_AMBULATORY_CARE_PROVIDER_SITE_OTHER): Payer: Medicare Other | Admitting: Primary Care

## 2019-11-10 ENCOUNTER — Other Ambulatory Visit: Payer: Self-pay

## 2019-11-10 ENCOUNTER — Encounter: Payer: Self-pay | Admitting: Primary Care

## 2019-11-10 VITALS — BP 126/80 | HR 81 | Temp 97.3°F | Ht 59.5 in | Wt 165.2 lb

## 2019-11-10 DIAGNOSIS — E038 Other specified hypothyroidism: Secondary | ICD-10-CM

## 2019-11-10 DIAGNOSIS — F329 Major depressive disorder, single episode, unspecified: Secondary | ICD-10-CM

## 2019-11-10 DIAGNOSIS — E785 Hyperlipidemia, unspecified: Secondary | ICD-10-CM | POA: Diagnosis not present

## 2019-11-10 DIAGNOSIS — F32A Depression, unspecified: Secondary | ICD-10-CM

## 2019-11-10 DIAGNOSIS — K219 Gastro-esophageal reflux disease without esophagitis: Secondary | ICD-10-CM

## 2019-11-10 DIAGNOSIS — N183 Chronic kidney disease, stage 3 unspecified: Secondary | ICD-10-CM

## 2019-11-10 DIAGNOSIS — F419 Anxiety disorder, unspecified: Secondary | ICD-10-CM | POA: Diagnosis not present

## 2019-11-10 DIAGNOSIS — G4700 Insomnia, unspecified: Secondary | ICD-10-CM

## 2019-11-10 DIAGNOSIS — Z23 Encounter for immunization: Secondary | ICD-10-CM

## 2019-11-10 DIAGNOSIS — Z0000 Encounter for general adult medical examination without abnormal findings: Secondary | ICD-10-CM

## 2019-11-10 MED ORDER — ZOSTER VAC RECOMB ADJUVANTED 50 MCG/0.5ML IM SUSR: 0.5000 mL | Freq: Once | INTRAMUSCULAR | 1 refills | Status: AC

## 2019-11-10 NOTE — Patient Instructions (Addendum)
Start exercising. You should be getting 150 minutes of moderate intensity exercise weekly.  Take the shingles vaccine to the pharmacy.  Schedule a lab appointment for 1 month for thyroid check.  It was a pleasure to see you today!

## 2019-11-10 NOTE — Assessment & Plan Note (Addendum)
Taking daily omeprazole 20 mg without breakthrough symptoms. Will try to wean off of omeprazole by having her take this every other day for one month then stop.   Use Tums for breakthrough symptoms. She will update.

## 2019-11-10 NOTE — Assessment & Plan Note (Signed)
Recent LDL at goal, continue simvastatin.

## 2019-11-10 NOTE — Assessment & Plan Note (Signed)
Continued, seems like she may have some restless legs at night. She will trial her husbands trazodone, discussed to take 1/2 tablet. She will update.

## 2019-11-10 NOTE — Assessment & Plan Note (Signed)
Recent TSH of 14 despite taking levothyroxine correctly. Suspect she needs doses in between 50 mcg and 75 mcg so we now have her on a regimen of 50 mcg four days weekly and 75 mcg three days weekly.  Repeat TSH in 4 weeks.

## 2019-11-10 NOTE — Assessment & Plan Note (Signed)
Feels well managed on current regimen of Wellbutrin XL and Citalopram, is having some daytime drowsiness. Will have her move to the PM. Denies SI/HI.

## 2019-11-10 NOTE — Assessment & Plan Note (Signed)
Immunizations UTD, Rx for Shingrix provided. Mammogram and bone density UTD. Scheduled to see GI soon. Encouraged exercise, healthy diet.

## 2019-11-10 NOTE — Assessment & Plan Note (Signed)
Noted for the last several years. She is not taking oral NSAID's. Continue to monitor.

## 2019-11-10 NOTE — Progress Notes (Signed)
Subjective:    Patient ID: Grace Ortiz, female    DOB: 1946/05/29, 74 y.o.   MRN: [id number]  HPI  This visit occurred during the SARS-CoV-2 public health emergency.  Safety protocols were in place, including screening questions prior to the visit, additional usage of staff PPE, and extensive cleaning of exam room while observing appropriate contact time as indicated for disinfecting solutions.   Grace Ortiz is a 74 year old female who presents today for [place] Part 2. She spoke with our health advisor this week.   Immunizations: -Tetanus: Completed in 2017 -Influenza: Completed this season  -Shingles: Completed Zostavax in 2012, never completed Shingrix -Pneumonia: Completed Prevnar in 2015, Pneumovax in 2017  Mammogram: Completed in September 2020 Dexa: Osteopenia, completed in 2019. Compliant to calcium and vitamin D. Colonoscopy: Following with GI.  Hep C Screen: Negative  BP Readings from Last 3 Encounters:  11/10/19 126/80  09/06/19 (!) 131/92  11/08/18 120/72     Review of Systems  Eyes: Negative for visual disturbance.  Respiratory: Negative for shortness of breath.   Cardiovascular: Negative for chest pain.  Neurological: Negative for dizziness and headaches.  Psychiatric/Behavioral: The patient is not nervous/anxious.        Past Medical History:  Diagnosis Date  . Acute UTI 08/02/2018  . Arthritis   . Bell's palsy   . Cervical polyp   . Diverticulitis   . Frequent headaches   . GERD (gastroesophageal reflux disease)   . Hyperlipidemia   . Hypothyroidism   . Macular retinal puckering, bilateral   . Migraines      Social History   Socioeconomic History  . Marital status: Married    Spouse name: Not on file  . Number of children: Not on file  . Years of education: Not on file  . Highest education level: Not on file  Occupational History  . Not on file  Tobacco Use  . Smoking status: Former Research scientist (life sciences)  . Smokeless tobacco: Never Used  Substance and  Sexual Activity  . Alcohol use: No  . Drug use: No  . Sexual activity: Not on file  Other Topics Concern  . Not on file  Social History Narrative   Married.   No children.   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    Moved from [place].   Social Determinants of Health   Financial Resource Strain: Low Risk   . Difficulty of Paying Living Expenses: Not hard at all  Food Insecurity: No Food Insecurity  . Worried About Charity fundraiser in the Last Year: Never true  . Ran Out of Food in the Last Year: Never true  Transportation Needs: No Transportation Needs  . Lack of Transportation (Medical): No  . Lack of Transportation (Non-Medical): No  Physical Activity: Inactive  . Days of Exercise per Week: 0 days  . Minutes of Exercise per Session: 0 min  Stress: No Stress Concern Present  . Feeling of Stress : Not at all  Social Connections:   . Frequency of Communication with Friends and Family: Not on file  . Frequency of Social Gatherings with Friends and Family: Not on file  . Attends Religious Services: Not on file  . Active Member of Clubs or Organizations: Not on file  . Attends Archivist Meetings: Not on file  . Marital Status: Not on file  Intimate Partner Violence: Not At Risk  . Fear of Current or Ex-Partner: No  . Emotionally Abused: No  .  Physically Abused: No  . Sexually Abused: No    Past Surgical History:  Procedure Laterality Date  . BLEPHAROPLASTY  2008, 2009  . BREAST EXCISIONAL BIOPSY Left 05/1993   neg  . BREAST EXCISIONAL BIOPSY Left 09/1993   neg  . CATARACT EXTRACTION  2008  . CERVICAL POLYPECTOMY  2014  . HYSTEROSCOPY  2014    Family History  Problem Relation Age of Onset  . Breast cancer Maternal Grandmother 30  . Cancer Maternal Grandmother        breast  . Early death Maternal Grandmother   . Hearing loss Mother   . Heart disease Mother   . Cancer Father        colon  . Hearing loss Father   . Diabetes Sister   .  Early death Paternal Grandfather     No Known Allergies  Current Outpatient Medications on File Prior to Visit  Medication Sig Dispense Refill  . Ascorbic Acid (VITAMIN C PO) Take 1,000 Units by mouth daily.    Marland Kitchen aspirin EC 81 MG tablet Take 81 mg by mouth daily.    Marland Kitchen buPROPion (WELLBUTRIN XL) 300 MG 24 hr tablet Take 1 tablet (300 mg total) by mouth daily. For anxiety/depression. 90 tablet 3  . Cholecalciferol (VITAMIN D3 PO) Take 1,200 mg by mouth daily.    . citalopram (CELEXA) 40 MG tablet Take 1 tablet (40 mg total) by mouth daily. For anxiety/depression. 90 tablet 3  . Cyanocobalamin (VITAMIN B12 PO) Take 2,500 mcg by mouth daily.    Marland Kitchen levothyroxine (SYNTHROID) 50 MCG tablet TAKE 1 TABLET BY MOUTH ON MONDAY, WEDNESDAY, FRIDAY MORNING ON EMPTY STOMACH WITH WATER, NO FOOD OR OTHER MEDS FOR 30 MIN 90 tablet 1  . levothyroxine (SYNTHROID) 75 MCG tablet TAKE 1 TABLET BY MOUTH ON SUNDAY, TUESDAY, THURSDAY, SATURDAY ON EMPTY STOMACH WITH WATER, NO FOOD OR OTHER MEDS FOR 30 MIN 90 tablet 1  . Omega-3 Fatty Acids (FISH OIL PO) Take 360 mg by mouth daily.    Marland Kitchen omeprazole (PRILOSEC) 20 MG capsule Take 1 capsule (20 mg total) by mouth daily. For heartburn. 90 capsule 3  . polyethylene glycol (MIRALAX / GLYCOLAX) packet Take 17 g by mouth daily.     . prochlorperazine (COMPAZINE) 10 MG tablet Take 1 tablet (10 mg total) by mouth every 6 (six) hours as needed for nausea or vomiting. 30 tablet 0  . simvastatin (ZOCOR) 10 MG tablet Take 1 tablet (10 mg total) by mouth every evening. For cholesterol. 90 tablet 3  . Tretinoin Microsphere (RETIN-A MICRO PUMP) 0.06 % GEL 1 Pump every other day.      No current facility-administered medications on file prior to visit.    BP 126/80   Pulse 81   Temp (!) 97.3 F (36.3 C) (Temporal)   Ht 4' 11.5" (1.511 m)   Wt 165 lb 4 oz (75 kg)   SpO2 98%   BMI 32.82 kg/m    Objective:   Physical Exam  Constitutional: She is oriented to person, place, and  time. She appears well-nourished.  HENT:  Right Ear: Tympanic membrane and ear canal normal.  Left Ear: Tympanic membrane and ear canal normal.  Mouth/Throat: Oropharynx is clear and moist.  Eyes: Pupils are equal, round, and reactive to light. EOM are normal.  Cardiovascular: Normal rate and regular rhythm.  Respiratory: Effort normal and breath sounds normal.  GI: Soft. Bowel sounds are normal. There is no abdominal tenderness.  Musculoskeletal:  General: Normal range of motion.     Cervical back: Neck supple.  Neurological: She is alert and oriented to person, place, and time. No cranial nerve deficit.  Reflex Scores:      Patellar reflexes are 2+ on the right side and 2+ on the left side. Skin: Skin is warm and dry.  Psychiatric: She has a normal mood and affect.           Assessment & Plan:

## 2019-11-21 DIAGNOSIS — H40003 Preglaucoma, unspecified, bilateral: Secondary | ICD-10-CM | POA: Diagnosis not present

## 2019-11-27 ENCOUNTER — Encounter: Payer: Self-pay | Admitting: Gastroenterology

## 2019-11-27 ENCOUNTER — Other Ambulatory Visit: Payer: Self-pay

## 2019-11-27 ENCOUNTER — Ambulatory Visit (INDEPENDENT_AMBULATORY_CARE_PROVIDER_SITE_OTHER): Payer: Medicare Other | Admitting: Gastroenterology

## 2019-11-27 VITALS — BP 118/68 | HR 66 | Temp 98.6°F | Wt 169.1 lb

## 2019-11-27 DIAGNOSIS — Z1211 Encounter for screening for malignant neoplasm of colon: Secondary | ICD-10-CM | POA: Diagnosis not present

## 2019-11-27 DIAGNOSIS — R194 Change in bowel habit: Secondary | ICD-10-CM | POA: Diagnosis not present

## 2019-11-27 MED ORDER — NA SULFATE-K SULFATE-MG SULF 17.5-3.13-1.6 GM/177ML PO SOLN: 354.0000 mL | Freq: Once | ORAL | 0 refills | Status: AC

## 2019-11-27 NOTE — Patient Instructions (Signed)

## 2019-11-27 NOTE — Progress Notes (Signed)
Jonathon Bellows MD, MRCP(U.K) [address]  [place]  [place], [place] [address]  Main: [phone]  Fax: [phone]   Gastroenterology Consultation  Referring Provider:     Pleas Koch, NP Primary Care Physician:  Pleas Koch, NP Primary Gastroenterologist:  Dr. Jonathon Bellows  Reason for Consultation:     "Irregular stools, colon cancer screening"        HPI:   Grace Ortiz is a 74 y.o. y/o female referred for consultation & management  by Dr. Carlis Abbott, Leticia Penna, NP.     11/02/2019: Hemoglobin 14.4 g, CMP normal except a lower GFR of 44.  GI PCR in December 2020 normal.  She says that from the beginning of January she has noted a change in her bowel movements.  It is formed but the pieces of stool are fragmented.  In the past she was constipated and had to take MiraLAX to have a good bowel movement.  Denies any regular NSAID use.  No blood in the stool.  No weight loss.  Last evaluation with a colonoscopy was back in 2015 and she said she was had polyps and was asked to return in 5 years time.  She is not on any blood thinners.  Past Medical History:  Diagnosis Date  . Acute UTI 08/02/2018  . Arthritis   . Bell's palsy   . Cervical polyp   . Diverticulitis   . Frequent headaches   . GERD (gastroesophageal reflux disease)   . Hyperlipidemia   . Hypothyroidism   . Macular retinal puckering, bilateral   . Migraines     Past Surgical History:  Procedure Laterality Date  . BLEPHAROPLASTY  2008, 2009  . BREAST EXCISIONAL BIOPSY Left 05/1993   neg  . BREAST EXCISIONAL BIOPSY Left 09/1993   neg  . CATARACT EXTRACTION  2008  . CERVICAL POLYPECTOMY  2014  . HYSTEROSCOPY  2014    Prior to Admission medications   Medication Sig Start Date End Date Taking? Authorizing Provider  Ascorbic Acid (VITAMIN C PO) Take 1,000 Units by mouth daily.    [provider]  aspirin EC 81 MG tablet Take 81 mg by mouth daily.    [provider]  buPROPion  (WELLBUTRIN XL) 300 MG 24 hr tablet Take 1 tablet (300 mg total) by mouth daily. For anxiety/depression. 11/14/18   Pleas Koch, NP  Cholecalciferol (VITAMIN D3 PO) Take 1,200 mg by mouth daily.    [provider]  citalopram (CELEXA) 40 MG tablet Take 1 tablet (40 mg total) by mouth daily. For anxiety/depression. 11/14/18   Pleas Koch, NP  Cyanocobalamin (VITAMIN B12 PO) Take 2,500 mcg by mouth daily.    [provider]  levothyroxine (SYNTHROID) 50 MCG tablet TAKE 1 TABLET BY MOUTH ON MONDAY, WEDNESDAY, FRIDAY MORNING ON EMPTY STOMACH WITH WATER, NO FOOD OR OTHER MEDS FOR 30 MIN 11/08/19   Pleas Koch, NP  levothyroxine (SYNTHROID) 75 MCG tablet TAKE 1 TABLET BY MOUTH ON SUNDAY, TUESDAY, THURSDAY, SATURDAY ON EMPTY STOMACH WITH WATER, NO FOOD OR OTHER MEDS FOR 30 MIN 11/08/19   Pleas Koch, NP  Omega-3 Fatty Acids (FISH OIL PO) Take 360 mg by mouth daily.    [provider]  omeprazole (PRILOSEC) 20 MG capsule Take 1 capsule (20 mg total) by mouth daily. For heartburn. 11/14/18   Pleas Koch, NP  polyethylene glycol (MIRALAX / GLYCOLAX) packet Take 17 g by mouth daily.  [provider]  prochlorperazine (COMPAZINE) 10 MG tablet Take 1 tablet (10 mg total) by mouth every 6 (six) hours as needed for nausea or vomiting. 10/29/17   Pleas Koch, NP  simvastatin (ZOCOR) 10 MG tablet Take 1 tablet (10 mg total) by mouth every evening. For cholesterol. 11/14/18   Pleas Koch, NP  Tretinoin Microsphere (RETIN-A MICRO PUMP) 0.06 % GEL 1 Pump every other day.  10/14/17   [provider]    Family History  Problem Relation Age of Onset  . Breast cancer Maternal Grandmother 30  . Cancer Maternal Grandmother        breast  . Early death Maternal Grandmother   . Hearing loss Mother   . Heart disease Mother   . Cancer Father        colon  . Hearing loss Father   . Diabetes Sister   . Early death Paternal  Grandfather      Social History   Tobacco Use  . Smoking status: Former Research scientist (life sciences)  . Smokeless tobacco: Never Used  Substance Use Topics  . Alcohol use: No  . Drug use: No    Allergies as of 11/27/2019  . (No Known Allergies)    Review of Systems:    All systems reviewed and negative except where noted in HPI.   Physical Exam:  There were no vitals taken for this visit. No LMP recorded. Patient is postmenopausal. Psych:  Alert and cooperative. Normal mood and affect. General:   Alert,  Well-developed, well-nourished, pleasant and cooperative in NAD Head:  Normocephalic and atraumatic. Eyes:  Sclera clear, no icterus.   Conjunctiva pink. Ears:  Normal auditory acuity. Lungs:  Respirations even and unlabored.  Clear throughout to auscultation.   No wheezes, crackles, or rhonchi. No acute distress. Heart:  Regular rate and rhythm; no murmurs, clicks, rubs, or gallops. Abdomen:  Normal bowel sounds.  No bruits.  Soft, non-tender and non-distended without masses, hepatosplenomegaly or hernias noted.  No guarding or rebound tenderness.    Neurologic:  Alert and oriented x3;  grossly normal neurologically. Psych:  Alert and cooperative. Normal mood and affect.  Imaging Studies: No results found.  Assessment and Plan:   Grace Ortiz is a 74 y.o. y/o female has been referred for change in bowel habits and colon cancer screening.  History of colon polyps in the past.  Plan 1.  Colonoscopy 2.  Discussed about increasing the fiber in her diet, there is no diarrhea.  Target of 25 g of fiber per day patient information will be provided.  I will also give samples of fiber pills for her.  I have discussed alternative options, risks & benefits,  which include, but are not limited to, bleeding, infection, perforation,respiratory complication & drug reaction.  The patient agrees with this plan & written consent will be obtained.      Follow up as needed  Dr Jonathon Bellows MD,MRCP(U.K)

## 2019-11-30 ENCOUNTER — Other Ambulatory Visit: Payer: Self-pay | Admitting: Primary Care

## 2019-11-30 DIAGNOSIS — E039 Hypothyroidism, unspecified: Secondary | ICD-10-CM

## 2019-12-11 ENCOUNTER — Other Ambulatory Visit (INDEPENDENT_AMBULATORY_CARE_PROVIDER_SITE_OTHER): Payer: Medicare Other

## 2019-12-11 ENCOUNTER — Other Ambulatory Visit: Payer: Self-pay

## 2019-12-11 DIAGNOSIS — E039 Hypothyroidism, unspecified: Secondary | ICD-10-CM | POA: Diagnosis not present

## 2019-12-11 LAB — TSH: TSH: 12.25 u[IU]/mL — ABNORMAL HIGH (ref 0.35–4.50)

## 2019-12-12 DIAGNOSIS — N183 Chronic kidney disease, stage 3 unspecified: Secondary | ICD-10-CM

## 2019-12-12 DIAGNOSIS — E039 Hypothyroidism, unspecified: Secondary | ICD-10-CM

## 2019-12-12 DIAGNOSIS — E038 Other specified hypothyroidism: Secondary | ICD-10-CM

## 2019-12-13 MED ORDER — LEVOTHYROXINE SODIUM 50 MCG PO TABS: ORAL_TABLET | ORAL | 1 refills | Status: DC

## 2019-12-13 MED ORDER — LEVOTHYROXINE SODIUM 75 MCG PO TABS: ORAL_TABLET | ORAL | 1 refills | Status: DC

## 2019-12-21 ENCOUNTER — Other Ambulatory Visit
Admission: RE | Admit: 2019-12-21 | Discharge: 2019-12-21 | Disposition: A | Discharge status: Home / Self Care | Payer: Medicare Other | Source: Ambulatory Visit | Attending: Gastroenterology | Admitting: Gastroenterology

## 2019-12-21 DIAGNOSIS — Z01812 Encounter for preprocedural laboratory examination: Secondary | ICD-10-CM | POA: Insufficient documentation

## 2019-12-21 DIAGNOSIS — Z20822 Contact with and (suspected) exposure to covid-19: Secondary | ICD-10-CM | POA: Diagnosis not present

## 2019-12-22 LAB — SARS CORONAVIRUS 2 (TAT 6-24 HRS): SARS Coronavirus 2: NEGATIVE

## 2019-12-25 ENCOUNTER — Ambulatory Visit: Payer: Medicare Other | Admitting: Anesthesiology

## 2019-12-25 ENCOUNTER — Ambulatory Visit
Admission: RE | Admit: 2019-12-25 | Discharge: 2019-12-25 | Disposition: A | Discharge status: Home / Self Care | Payer: Medicare Other | Attending: Gastroenterology | Admitting: Gastroenterology

## 2019-12-25 ENCOUNTER — Encounter: Payer: Self-pay | Admitting: Gastroenterology

## 2019-12-25 ENCOUNTER — Other Ambulatory Visit: Payer: Self-pay

## 2019-12-25 ENCOUNTER — Encounter
Admission: RE | Disposition: A | Discharge status: Home / Self Care | Payer: Self-pay | Source: Home / Self Care | Attending: Gastroenterology

## 2019-12-25 DIAGNOSIS — Z87891 Personal history of nicotine dependence: Secondary | ICD-10-CM | POA: Diagnosis not present

## 2019-12-25 DIAGNOSIS — E039 Hypothyroidism, unspecified: Secondary | ICD-10-CM | POA: Diagnosis not present

## 2019-12-25 DIAGNOSIS — Z1211 Encounter for screening for malignant neoplasm of colon: Secondary | ICD-10-CM | POA: Insufficient documentation

## 2019-12-25 DIAGNOSIS — F418 Other specified anxiety disorders: Secondary | ICD-10-CM | POA: Diagnosis not present

## 2019-12-25 DIAGNOSIS — Z7982 Long term (current) use of aspirin: Secondary | ICD-10-CM | POA: Insufficient documentation

## 2019-12-25 DIAGNOSIS — D122 Benign neoplasm of ascending colon: Secondary | ICD-10-CM | POA: Insufficient documentation

## 2019-12-25 DIAGNOSIS — K5730 Diverticulosis of large intestine without perforation or abscess without bleeding: Secondary | ICD-10-CM

## 2019-12-25 DIAGNOSIS — E785 Hyperlipidemia, unspecified: Secondary | ICD-10-CM | POA: Diagnosis not present

## 2019-12-25 DIAGNOSIS — Z86010 Personal history of colonic polyps: Secondary | ICD-10-CM | POA: Diagnosis not present

## 2019-12-25 DIAGNOSIS — Z8719 Personal history of other diseases of the digestive system: Secondary | ICD-10-CM | POA: Insufficient documentation

## 2019-12-25 DIAGNOSIS — K640 First degree hemorrhoids: Secondary | ICD-10-CM | POA: Diagnosis not present

## 2019-12-25 DIAGNOSIS — F329 Major depressive disorder, single episode, unspecified: Secondary | ICD-10-CM | POA: Insufficient documentation

## 2019-12-25 DIAGNOSIS — F419 Anxiety disorder, unspecified: Secondary | ICD-10-CM | POA: Insufficient documentation

## 2019-12-25 DIAGNOSIS — Z79899 Other long term (current) drug therapy: Secondary | ICD-10-CM | POA: Insufficient documentation

## 2019-12-25 DIAGNOSIS — K635 Polyp of colon: Secondary | ICD-10-CM | POA: Insufficient documentation

## 2019-12-25 DIAGNOSIS — K5790 Diverticulosis of intestine, part unspecified, without perforation or abscess without bleeding: Secondary | ICD-10-CM | POA: Diagnosis not present

## 2019-12-25 DIAGNOSIS — N183 Chronic kidney disease, stage 3 unspecified: Secondary | ICD-10-CM | POA: Diagnosis not present

## 2019-12-25 HISTORY — PX: COLONOSCOPY WITH PROPOFOL: SHX5780

## 2019-12-25 SURGERY — COLONOSCOPY WITH PROPOFOL
Anesthesia: General

## 2019-12-25 MED ORDER — METOPROLOL TARTRATE 5 MG/5ML IV SOLN
INTRAVENOUS | Status: DC | PRN
  Administered 2019-12-25: 5 mg via INTRAVENOUS

## 2019-12-25 MED ORDER — SODIUM CHLORIDE 0.9 % IV SOLN: INTRAVENOUS | Status: DC

## 2019-12-25 MED ORDER — LIDOCAINE HCL (CARDIAC) PF 100 MG/5ML IV SOSY
PREFILLED_SYRINGE | INTRAVENOUS | Status: DC | PRN
  Administered 2019-12-25: 100 mg via INTRATRACHEAL

## 2019-12-25 MED ORDER — GLYCOPYRROLATE 0.2 MG/ML IJ SOLN
INTRAMUSCULAR | Status: DC | PRN
  Administered 2019-12-25: .2 mg via INTRAVENOUS

## 2019-12-25 MED ORDER — PROPOFOL 10 MG/ML IV BOLUS
INTRAVENOUS | Status: DC | PRN
  Administered 2019-12-25: 20 mg via INTRAVENOUS
  Administered 2019-12-25: 40 mg via INTRAVENOUS

## 2019-12-25 MED ORDER — LABETALOL HCL 5 MG/ML IV SOLN
INTRAVENOUS | Status: DC | PRN
  Administered 2019-12-25: 10 mg via INTRAVENOUS

## 2019-12-25 MED ORDER — PROPOFOL 500 MG/50ML IV EMUL
INTRAVENOUS | Status: DC | PRN
  Administered 2019-12-25: 135 ug/kg/min via INTRAVENOUS

## 2019-12-25 NOTE — Transfer of Care (Signed)
Immediate Anesthesia Transfer of Care Note  Patient: Grace Ortiz  Procedure(s) Performed: COLONOSCOPY WITH PROPOFOL (N/A )  Patient Location: Endoscopy Unit  Anesthesia Type:General  Level of Consciousness: awake, alert , oriented and patient cooperative  Airway & Oxygen Therapy: Patient Spontanous Breathing and Patient connected to face mask oxygen  Post-op Assessment: Report given to RN and Post -op Vital signs reviewed and stable  Post vital signs: Reviewed and stable  Last Vitals:  Vitals Value Taken Time  BP    Temp    Pulse 66 12/25/19 0931  Resp 16 12/25/19 0931  SpO2 98 % 12/25/19 0931  Vitals shown include unvalidated device data.  Last Pain:  Vitals:   12/25/19 0928  TempSrc:   PainSc: 0-No pain         Complications: No apparent anesthesia complications

## 2019-12-25 NOTE — Anesthesia Preprocedure Evaluation (Addendum)
Anesthesia Evaluation  Patient identified by MRN, date of birth, ID band Patient awake    Airway Mallampati: III  TM Distance: >3 FB     Dental   Pulmonary former smoker,    Pulmonary exam normal        Cardiovascular negative cardio ROS Normal cardiovascular exam     Neuro/Psych  Headaches, PSYCHIATRIC DISORDERS Anxiety Depression  Neuromuscular disease    GI/Hepatic Neg liver ROS, GERD  ,  Endo/Other  Hypothyroidism   Renal/GU Renal InsufficiencyRenal disease     Musculoskeletal  (+) Arthritis , Osteoarthritis,    Abdominal Normal abdominal exam  (+)   Peds negative pediatric ROS (+)  Hematology negative hematology ROS (+)   Anesthesia Other Findings Past Medical History: 08/02/2018: Acute UTI No date: Arthritis No date: Bell's palsy No date: Cervical polyp No date: Diverticulitis No date: Frequent headaches No date: GERD (gastroesophageal reflux disease) No date: Hyperlipidemia No date: Hypothyroidism No date: Macular retinal puckering, bilateral No date: Migraines  Reproductive/Obstetrics                            Anesthesia Physical Anesthesia Plan  ASA: III  Anesthesia Plan: General   Post-op Pain Management:    Induction: Intravenous  PONV Risk Score and Plan: Propofol infusion  Airway Management Planned: Nasal Cannula  Additional Equipment:   Intra-op Plan:   Post-operative Plan:   Informed Consent: I have reviewed the patients History and Physical, chart, labs and discussed the procedure including the risks, benefits and alternatives for the proposed anesthesia with the patient or authorized representative who has indicated his/her understanding and acceptance.     Dental advisory given  Plan Discussed with: CRNA and Surgeon  Anesthesia Plan Comments:         Anesthesia Quick Evaluation

## 2019-12-25 NOTE — Anesthesia Postprocedure Evaluation (Signed)
Anesthesia Post Note  Patient: Grace Ortiz  Procedure(s) Performed: COLONOSCOPY WITH PROPOFOL (N/A )  Patient location during evaluation: Endoscopy Anesthesia Type: General Level of consciousness: awake and alert and oriented Pain management: pain level controlled Vital Signs Assessment: post-procedure vital signs reviewed and stable Respiratory status: spontaneous breathing Cardiovascular status: blood pressure returned to baseline Anesthetic complications: no     Last Vitals:  Vitals:   12/25/19 0938 12/25/19 0948  BP: 111/89 102/82  Pulse: 65 68  Resp: 20 17  Temp:    SpO2: 99% 93%    Last Pain:  Vitals:   12/25/19 0948  TempSrc:   PainSc: 0-No pain                 Jennika Ringgold

## 2019-12-25 NOTE — Op Note (Signed)
[facility] Gastroenterology Patient Name: Grace Ortiz Procedure Date: 12/25/2019 8:49 AM MRN: [id number] Account #: [id number] Date of Birth: Mar 24, 1946 Admit Type: Outpatient Age: 74 Room: [facility] ENDO ROOM 4 Gender: Female Note Status: Finalized Procedure:             Colonoscopy Indications:           High risk colon cancer surveillance: Personal history                         of colonic polyps Providers:             Jonathon Bellows MD, MD Referring MD:          No Local Md, MD (Referring MD) Medicines:             Monitored Anesthesia Care Complications:         No immediate complications. Procedure:             Pre-Anesthesia Assessment:                        - ASA Grade Assessment: III - A patient with severe                         systemic disease.                        After obtaining informed consent, the colonoscope was                         passed under direct vision. Throughout the procedure,                         the patient's blood pressure, pulse, and oxygen                         saturations were monitored continuously. The                         Colonoscope was introduced through the anus and                         advanced to the the cecum, identified by the                         appendiceal orifice. The colonoscopy was performed                         with moderate difficulty due to the patient's                         inability to tolerate conscious sedation. The quality                         of the bowel preparation was good. Findings:      The perianal and digital rectal examinations were normal.      Multiple small-mouthed diverticula were found in the sigmoid colon.      A 5 mm polyp was found in the descending colon. The polyp was sessile.       The polyp was removed with a cold snare.  Resection and retrieval were       complete.      A 10 mm polyp was found in the ascending colon. The polyp was sessile.       The polyp was  removed with a cold snare. Resection and retrieval were       complete.      A 7 mm polyp was found in the ascending colon. The polyp was sessile.       The polyp was removed with a cold snare. Resection and retrieval were       complete. To prevent bleeding after the polypectomy, one hemostatic clip       was successfully placed. There was no bleeding during, or at the end, of       the procedure.      Non-bleeding internal hemorrhoids were found during retroflexion. The       hemorrhoids were large and Grade I (internal hemorrhoids that do not       prolapse).      The exam was otherwise without abnormality on direct and retroflexion       views. Impression:            - Diverticulosis in the sigmoid colon.                        - One 5 mm polyp in the descending colon, removed with                         a cold snare. Resected and retrieved.                        - One 10 mm polyp in the ascending colon, removed with                         a cold snare. Resected and retrieved.                        - One 7 mm polyp in the ascending colon, removed with                         a cold snare. Resected and retrieved. Clip was placed.                        - Non-bleeding internal hemorrhoids.                        - The examination was otherwise normal on direct and                         retroflexion views. Recommendation:        - Discharge patient to home (with escort).                        - Resume previous diet.                        - Continue present medications.                        - Await pathology results. Procedure Code(s):     --- Professional ---  45385, Colonoscopy, flexible; with removal of                         tumor(s), polyp(s), or other lesion(s) by snare                         technique Diagnosis Code(s):     --- Professional ---                        Z86.010, Personal history of colonic polyps                        K64.0,  First degree hemorrhoids                        K63.5, Polyp of colon                        K57.30, Diverticulosis of large intestine without                         perforation or abscess without bleeding CPT copyright 2019 American Medical Association. All rights reserved. The codes documented in this report are preliminary and upon coder review may  be revised to meet current compliance requirements. Jonathon Bellows, MD Jonathon Bellows MD, MD 12/25/2019 9:25:21 AM This report has been signed electronically. Number of Addenda: 0 Note Initiated On: 12/25/2019 8:49 AM Scope Withdrawal Time: 0 hours 14 minutes 57 seconds  Total Procedure Duration: 0 hours 27 minutes 12 seconds  Estimated Blood Loss:  Estimated blood loss: none.      [facility]

## 2019-12-25 NOTE — H&P (Signed)
Jonathon Bellows, MD [address] [place], [place], [place], [address] [address], [place], [place], [place], [address] Phone: [phone]  Fax: [phone]  Primary Care Physician:  Pleas Koch, NP   Pre-Procedure History & Physical: HPI:  Grace Ortiz is a 74 y.o. female is here for an colonoscopy.   Past Medical History:  Diagnosis Date  . Acute UTI 08/02/2018  . Arthritis   . Bell's palsy   . Cervical polyp   . Diverticulitis   . Frequent headaches   . GERD (gastroesophageal reflux disease)   . Hyperlipidemia   . Hypothyroidism   . Macular retinal puckering, bilateral   . Migraines     Past Surgical History:  Procedure Laterality Date  . BLEPHAROPLASTY  2008, 2009  . BREAST EXCISIONAL BIOPSY Left 05/1993   neg  . BREAST EXCISIONAL BIOPSY Left 09/1993   neg  . CATARACT EXTRACTION  2008  . CERVICAL POLYPECTOMY  2014  . COLONOSCOPY    . DILATION AND CURETTAGE OF UTERUS    . HYSTEROSCOPY  2014  . UPPER ENDOSCOPY W/ ANTRODUODENAL MANOMETRY      Prior to Admission medications   Medication Sig Start Date End Date Taking? Authorizing Provider  buPROPion (WELLBUTRIN XL) 300 MG 24 hr tablet Take 1 tablet (300 mg total) by mouth daily. For anxiety/depression. 11/14/18  Yes Pleas Koch, NP  citalopram (CELEXA) 40 MG tablet Take 1 tablet (40 mg total) by mouth daily. For anxiety/depression. 11/14/18  Yes Pleas Koch, NP  levothyroxine (SYNTHROID) 50 MCG tablet TAKE 1 TABLET BY on Sunday ON EMPTY STOMACH WITH WATER, NO FOOD OR OTHER MEDS FOR 30 MIN 12/13/19  Yes Pleas Koch, NP  levothyroxine (SYNTHROID) 75 MCG tablet TAKE 1 TABLET BY MOUTH ON Monday through [place], NO FOOD OR OTHER MEDS FOR 30 MIN 12/13/19  Yes Pleas Koch, NP  polyethylene glycol (MIRALAX / GLYCOLAX) packet Take 17 g by mouth daily.    Yes [provider]  Ascorbic Acid (VITAMIN C PO) Take 1,000 Units by mouth daily.    [provider]  aspirin EC 81 MG tablet Take 81 mg by mouth daily.    [provider]  Cholecalciferol (VITAMIN D3 PO) Take 1,200 mg by mouth daily.    [provider]  Cyanocobalamin (VITAMIN B12 PO) Take 2,500 mcg by mouth daily.    [provider]  Omega-3 Fatty Acids (FISH OIL PO) Take 360 mg by mouth daily.    [provider]  omeprazole (PRILOSEC) 20 MG capsule Take 1 capsule (20 mg total) by mouth daily. For heartburn. Patient not taking: Reported on 12/25/2019 11/14/18   Pleas Koch, NP  prochlorperazine (COMPAZINE) 10 MG tablet Take 1 tablet (10 mg total) by mouth every 6 (six) hours as needed for nausea or vomiting. 10/29/17   Pleas Koch, NP  simvastatin (ZOCOR) 10 MG tablet Take 1 tablet (10 mg total) by mouth every evening. For cholesterol. 11/14/18   Pleas Koch, NP  Tretinoin Microsphere (RETIN-A MICRO PUMP) 0.06 % GEL 1 Pump every other day.  10/14/17   [provider]    Allergies as of 11/27/2019  . (No Known Allergies)    Family History  Problem Relation Age of Onset  . Breast cancer Maternal Grandmother 30  . Cancer Maternal Grandmother        breast  . Early death Maternal Grandmother   . Hearing  loss Mother   . Heart disease Mother   . Cancer Father        colon  . Hearing loss Father   . Diabetes Sister   . Early death Paternal Grandfather     Social History   Socioeconomic History  . Marital status: Married    Spouse name: Not on file  . Number of children: Not on file  . Years of education: Not on file  . Highest education level: Not on file  Occupational History  . Not on file  Tobacco Use  . Smoking status: Former Research scientist (life sciences)  . Smokeless tobacco: Never Used  Substance and Sexual Activity  . Alcohol use: Yes    Comment: rarely  . Drug use: No  . Sexual activity: Not on file  Other Topics Concern  . Not on file  Social History Narrative   Married.   No children.   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    Moved from [place].   Social Determinants of Health   Financial Resource Strain: Low Risk   . Difficulty of Paying Living Expenses: Not hard at all  Food Insecurity: No Food Insecurity  . Worried About Charity fundraiser in the Last Year: Never true  . Ran Out of Food in the Last Year: Never true  Transportation Needs: No Transportation Needs  . Lack of Transportation (Medical): No  . Lack of Transportation (Non-Medical): No  Physical Activity: Inactive  . Days of Exercise per Week: 0 days  . Minutes of Exercise per Session: 0 min  Stress: No Stress Concern Present  . Feeling of Stress : Not at all  Social Connections:   . Frequency of Communication with Friends and Family: Not on file  . Frequency of Social Gatherings with Friends and Family: Not on file  . Attends Religious Services: Not on file  . Active Member of Clubs or Organizations: Not on file  . Attends Archivist Meetings: Not on file  . Marital Status: Not on file  Intimate Partner Violence: Not At Risk  . Fear of Current or Ex-Partner: No  . Emotionally Abused: No  . Physically Abused: No  . Sexually Abused: No    Review of Systems: See HPI, otherwise negative ROS  Physical Exam: BP 132/81   Pulse 89   Temp 97.6 F (36.4 C) (Temporal)   Resp 16   Ht 4\' 11"  (1.499 m)   Wt 72.6 kg   SpO2 99%   BMI 32.32 kg/m  General:   Alert,  pleasant and cooperative in NAD Head:  Normocephalic and atraumatic. Neck:  Supple; no masses or thyromegaly. Lungs:  Clear throughout to auscultation, normal respiratory effort.    Heart:  +S1, +S2, Regular rate and rhythm, No edema. Abdomen:  Soft, nontender and nondistended. Normal bowel sounds, without guarding, and without rebound.   Neurologic:  Alert and  oriented x4;  grossly normal neurologically.  Impression/Plan: Grace Ortiz is here for an colonoscopy to be performed for surveillance due to prior history of colon  polyps   Risks, benefits, limitations, and alternatives regarding  colonoscopy have been reviewed with the patient.  Questions have been answered.  All parties agreeable.   Jonathon Bellows, MD  12/25/2019, 8:44 AM

## 2019-12-26 ENCOUNTER — Encounter: Payer: Self-pay | Admitting: *Deleted

## 2019-12-26 LAB — SURGICAL PATHOLOGY

## 2019-12-28 ENCOUNTER — Encounter: Payer: Self-pay | Admitting: Primary Care

## 2019-12-28 ENCOUNTER — Other Ambulatory Visit: Payer: Self-pay

## 2019-12-28 ENCOUNTER — Other Ambulatory Visit: Payer: Self-pay | Admitting: Primary Care

## 2019-12-28 ENCOUNTER — Ambulatory Visit (INDEPENDENT_AMBULATORY_CARE_PROVIDER_SITE_OTHER): Payer: Medicare Other | Admitting: Primary Care

## 2019-12-28 VITALS — BP 114/78 | HR 73 | Temp 97.1°F | Ht 59.0 in | Wt 162.8 lb

## 2019-12-28 DIAGNOSIS — N183 Chronic kidney disease, stage 3 unspecified: Secondary | ICD-10-CM

## 2019-12-28 DIAGNOSIS — E038 Other specified hypothyroidism: Secondary | ICD-10-CM

## 2019-12-28 DIAGNOSIS — K219 Gastro-esophageal reflux disease without esophagitis: Secondary | ICD-10-CM

## 2019-12-28 DIAGNOSIS — N950 Postmenopausal bleeding: Secondary | ICD-10-CM | POA: Diagnosis not present

## 2019-12-28 DIAGNOSIS — F419 Anxiety disorder, unspecified: Secondary | ICD-10-CM

## 2019-12-28 DIAGNOSIS — G4700 Insomnia, unspecified: Secondary | ICD-10-CM | POA: Diagnosis not present

## 2019-12-28 DIAGNOSIS — F329 Major depressive disorder, single episode, unspecified: Secondary | ICD-10-CM

## 2019-12-28 LAB — POC URINALSYSI DIPSTICK (AUTOMATED)
Bilirubin, UA: NEGATIVE
Blood, UA: NEGATIVE
Glucose, UA: NEGATIVE
Ketones, UA: NEGATIVE
Leukocytes, UA: NEGATIVE
Nitrite, UA: NEGATIVE
Protein, UA: POSITIVE — AB
Spec Grav, UA: >=1.03 — AB (ref 1.010–1.025)
Urobilinogen, UA: 0.2 E.U./dL
pH, UA: 6 (ref 5.0–8.0)

## 2019-12-28 MED ORDER — TRAZODONE HCL 50 MG PO TABS: 50.0000 mg | ORAL_TABLET | Freq: Every evening | ORAL | 3 refills | Status: DC | PRN

## 2019-12-28 NOTE — Patient Instructions (Signed)
You will be contacted regarding your ultrasound.  Please let us know if you have not been contacted within two weeks.   It was a pleasure to see you today!  

## 2019-12-28 NOTE — Assessment & Plan Note (Signed)
Compliant to levothyroxine 75 mcg six days weekly, taking 50 mcg once daily. Repeat TSH due in a few weeks.

## 2019-12-28 NOTE — Assessment & Plan Note (Signed)
Improved with husbands trazodone. Rx for Trazodone 50 mg sent to pharmacy.

## 2019-12-28 NOTE — Assessment & Plan Note (Signed)
History of "uterine polyps" with D&C x 2. Recent episode of vaginal bleeding was 2 weeks ago, no bleeding since.   Given history of polyps coupled with vaginal bleeding, we will obtain a vaginal ultrasound. Ordered and pending.  UA today without blood or signs of infection.

## 2019-12-28 NOTE — Progress Notes (Signed)
Subjective:    Patient ID: Grace Ortiz, female    DOB: 11-19-1945, 74 y.o.   MRN: [id number]  HPI  This visit occurred during the SARS-CoV-2 public health emergency.  Safety protocols were in place, including screening questions prior to the visit, additional usage of staff PPE, and extensive cleaning of exam room while observing appropriate contact time as indicated for disinfecting solutions.   [name] is a 74 year old female with a history of hypothyroidism, GERD, CKD, hyperlipidemia, anxiety and depression, post menopause who presents today with a chief complaint of vaginal bleeding.  History of uterine "polyps" that were categorized as benign, has had two D&C procedures with the last one being in 2014. This was discovered after bright red vaginal bleeding.  Her recent bleeding episode began about 2 weeks ago, noticed on the toilet paper within some mucous when wiping after urinating. The following morning she saw a scant amount of bleeding in her panty liner, has not seen any bleeding since.   She denies urinary frequency, dysuria, pelvic pain. She had a colonoscopy on 12/25/19.   Of note, her insomnia has improved by taking 50 mg of her husbands Trazodone, she would like a prescription of her own.   Review of Systems  Gastrointestinal: Negative for abdominal pain.  Genitourinary: Positive for vaginal bleeding and vaginal discharge. Negative for dysuria, frequency and pelvic pain.       Past Medical History:  Diagnosis Date  . Acute UTI 08/02/2018  . Arthritis   . Bell's palsy   . Cervical polyp   . Diverticulitis   . Frequent headaches   . GERD (gastroesophageal reflux disease)   . Hyperlipidemia   . Hypothyroidism   . Macular retinal puckering, bilateral   . Migraines      Social History   Socioeconomic History  . Marital status: Married    Spouse name: Not on file  . Number of children: Not on file  . Years of education: Not on file  . Highest education  level: Not on file  Occupational History  . Not on file  Tobacco Use  . Smoking status: Former Research scientist (life sciences)  . Smokeless tobacco: Never Used  Substance and Sexual Activity  . Alcohol use: Yes    Comment: rarely  . Drug use: No  . Sexual activity: Not on file  Other Topics Concern  . Not on file  Social History Narrative   Married.   No children.   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    Moved from [place].   Social Determinants of Health   Financial Resource Strain: Low Risk   . Difficulty of Paying Living Expenses: Not hard at all  Food Insecurity: No Food Insecurity  . Worried About Charity fundraiser in the Last Year: Never true  . Ran Out of Food in the Last Year: Never true  Transportation Needs: No Transportation Needs  . Lack of Transportation (Medical): No  . Lack of Transportation (Non-Medical): No  Physical Activity: Inactive  . Days of Exercise per Week: 0 days  . Minutes of Exercise per Session: 0 min  Stress: No Stress Concern Present  . Feeling of Stress : Not at all  Social Connections:   . Frequency of Communication with Friends and Family:   . Frequency of Social Gatherings with Friends and Family:   . Attends Religious Services:   . Active Member of Clubs or Organizations:   . Attends Archivist Meetings:   .  Marital Status:   Intimate Partner Violence: Not At Risk  . Fear of Current or Ex-Partner: No  . Emotionally Abused: No  . Physically Abused: No  . Sexually Abused: No    Past Surgical History:  Procedure Laterality Date  . BLEPHAROPLASTY  2008, 2009  . BREAST EXCISIONAL BIOPSY Left 05/1993   neg  . BREAST EXCISIONAL BIOPSY Left 09/1993   neg  . CATARACT EXTRACTION  2008  . CERVICAL POLYPECTOMY  2014  . COLONOSCOPY    . COLONOSCOPY WITH PROPOFOL N/A 12/25/2019   Procedure: COLONOSCOPY WITH PROPOFOL;  Surgeon: Jonathon Bellows, MD;  Location: [facility] ENDOSCOPY;  Service: Gastroenterology;  Laterality: N/A;  . DILATION AND  CURETTAGE OF UTERUS    . HYSTEROSCOPY  2014  . UPPER ENDOSCOPY W/ ANTRODUODENAL MANOMETRY      Family History  Problem Relation Age of Onset  . Breast cancer Maternal Grandmother 30  . Cancer Maternal Grandmother        breast  . Early death Maternal Grandmother   . Hearing loss Mother   . Heart disease Mother   . Cancer Father        colon  . Hearing loss Father   . Diabetes Sister   . Early death Paternal Grandfather     No Known Allergies  Current Outpatient Medications on File Prior to Visit  Medication Sig Dispense Refill  . Ascorbic Acid (VITAMIN C PO) Take 1,000 Units by mouth daily.    Marland Kitchen aspirin EC 81 MG tablet Take 81 mg by mouth daily.    Marland Kitchen buPROPion (WELLBUTRIN XL) 300 MG 24 hr tablet Take 1 tablet (300 mg total) by mouth daily. For anxiety/depression. 90 tablet 3  . Cholecalciferol (VITAMIN D3 PO) Take 1,200 mg by mouth daily.    . Cyanocobalamin (VITAMIN B12 PO) Take 2,500 mcg by mouth daily.    Marland Kitchen levothyroxine (SYNTHROID) 50 MCG tablet TAKE 1 TABLET BY on Sunday ON EMPTY STOMACH WITH WATER, NO FOOD OR OTHER MEDS FOR 30 MIN 12 tablet 1  . levothyroxine (SYNTHROID) 75 MCG tablet TAKE 1 TABLET BY MOUTH ON Monday through [place], NO FOOD OR OTHER MEDS FOR 30 MIN 72 tablet 1  . Omega-3 Fatty Acids (FISH OIL PO) Take 360 mg by mouth daily.    . polyethylene glycol (MIRALAX / GLYCOLAX) packet Take 17 g by mouth daily.     . prochlorperazine (COMPAZINE) 10 MG tablet Take 1 tablet (10 mg total) by mouth every 6 (six) hours as needed for nausea or vomiting. 30 tablet 0  . simvastatin (ZOCOR) 10 MG tablet Take 1 tablet (10 mg total) by mouth every evening. For cholesterol. 90 tablet 3  . Tretinoin Microsphere (RETIN-A MICRO PUMP) 0.06 % GEL 1 Pump every other day.      No current facility-administered medications on file prior to visit.    BP 114/78   Pulse 73   Temp (!) 97.1 F (36.2 C) (Temporal)   Ht 4\' 11"  (1.499 m)   Wt 162 lb 12 oz  (73.8 kg)   SpO2 98%   BMI 32.87 kg/m    Objective:   Physical Exam  Constitutional: She appears well-nourished.  Cardiovascular: Normal rate and regular rhythm.  Respiratory: Effort normal and breath sounds normal.  Musculoskeletal:     Cervical back: Neck supple.  Skin: Skin is warm and dry.           Assessment & Plan:

## 2019-12-28 NOTE — Assessment & Plan Note (Signed)
Recent drop in GFR. Based off of records it appears that she's had evidence of decreased renal function since 2014.  Consider ultrasound of kidneys. Offered this today, she kindly declines.

## 2019-12-28 NOTE — Addendum Note (Signed)
Addended by: Jacqualin Combes on: 12/28/2019 12:11 PM   Modules accepted: Orders

## 2019-12-29 DIAGNOSIS — N183 Chronic kidney disease, stage 3 unspecified: Secondary | ICD-10-CM

## 2020-01-02 ENCOUNTER — Encounter: Payer: Self-pay | Admitting: Gastroenterology

## 2020-01-03 ENCOUNTER — Other Ambulatory Visit: Payer: Self-pay | Admitting: Primary Care

## 2020-01-03 DIAGNOSIS — F329 Major depressive disorder, single episode, unspecified: Secondary | ICD-10-CM

## 2020-01-03 DIAGNOSIS — F419 Anxiety disorder, unspecified: Secondary | ICD-10-CM

## 2020-01-03 DIAGNOSIS — E785 Hyperlipidemia, unspecified: Secondary | ICD-10-CM

## 2020-01-05 ENCOUNTER — Other Ambulatory Visit: Payer: Self-pay

## 2020-01-05 ENCOUNTER — Ambulatory Visit
Admission: RE | Admit: 2020-01-05 | Discharge: 2020-01-05 | Disposition: A | Discharge status: Home / Self Care | Payer: Medicare Other | Source: Ambulatory Visit | Attending: Primary Care | Admitting: Primary Care

## 2020-01-05 DIAGNOSIS — N183 Chronic kidney disease, stage 3 unspecified: Secondary | ICD-10-CM | POA: Insufficient documentation

## 2020-01-05 DIAGNOSIS — N950 Postmenopausal bleeding: Secondary | ICD-10-CM | POA: Insufficient documentation

## 2020-01-05 DIAGNOSIS — N281 Cyst of kidney, acquired: Secondary | ICD-10-CM | POA: Diagnosis not present

## 2020-01-08 DIAGNOSIS — N183 Chronic kidney disease, stage 3 unspecified: Secondary | ICD-10-CM

## 2020-01-08 DIAGNOSIS — N2889 Other specified disorders of kidney and ureter: Secondary | ICD-10-CM

## 2020-01-10 ENCOUNTER — Other Ambulatory Visit: Payer: Self-pay

## 2020-01-10 ENCOUNTER — Ambulatory Visit
Admission: RE | Admit: 2020-01-10 | Discharge: 2020-01-10 | Disposition: A | Discharge status: Home / Self Care | Payer: Medicare Other | Source: Ambulatory Visit | Attending: Primary Care | Admitting: Primary Care

## 2020-01-10 DIAGNOSIS — N2889 Other specified disorders of kidney and ureter: Secondary | ICD-10-CM | POA: Diagnosis not present

## 2020-01-10 DIAGNOSIS — K449 Diaphragmatic hernia without obstruction or gangrene: Secondary | ICD-10-CM | POA: Diagnosis not present

## 2020-01-10 DIAGNOSIS — N183 Chronic kidney disease, stage 3 unspecified: Secondary | ICD-10-CM | POA: Insufficient documentation

## 2020-01-10 LAB — POCT I-STAT CREATININE: Creatinine, Ser: 1.3 mg/dL — ABNORMAL HIGH (ref 0.44–1.00)

## 2020-01-10 MED ORDER — IOHEXOL 300 MG/ML  SOLN: 100.0000 mL | Freq: Once | INTRAMUSCULAR | Status: DC | PRN

## 2020-01-10 MED ORDER — IOHEXOL 300 MG/ML  SOLN
85.0000 mL | Freq: Once | INTRAMUSCULAR | Status: AC | PRN
  Administered 2020-01-10: 85 mL via INTRAVENOUS

## 2020-01-11 DIAGNOSIS — R911 Solitary pulmonary nodule: Secondary | ICD-10-CM

## 2020-01-12 ENCOUNTER — Other Ambulatory Visit: Payer: Self-pay | Admitting: Primary Care

## 2020-01-12 DIAGNOSIS — R911 Solitary pulmonary nodule: Secondary | ICD-10-CM

## 2020-01-15 ENCOUNTER — Other Ambulatory Visit: Payer: Self-pay

## 2020-01-15 ENCOUNTER — Ambulatory Visit: Payer: Medicare Other | Attending: Internal Medicine

## 2020-01-15 DIAGNOSIS — Z23 Encounter for immunization: Secondary | ICD-10-CM

## 2020-01-15 NOTE — Progress Notes (Signed)
   Covid-19 Vaccination Clinic  Name:  Grace Ortiz    MRN: [id number] DOB: July 14, 1946  01/15/2020  [name] was observed post Covid-19 immunization for 15 minutes without incident. She was provided with Vaccine Information Sheet and instruction to access the V-Safe system.   [name] was instructed to call 911 with any severe reactions post vaccine: Marland Kitchen Difficulty breathing  . Swelling of face and throat  . A fast heartbeat  . A bad rash all over body  . Dizziness and weakness   Immunizations Administered    Name Date Dose VIS Date Route   Pfizer COVID-19 Vaccine 01/15/2020 11:38 AM 0.3 mL 09/29/2019 Intramuscular   Manufacturer: [place]   Lot: [id number]   [place]: [id number]

## 2020-01-16 IMAGING — CT CT HEAD W/O CM
3 series · 15 of 47 positions shown, 18 images · non-contrast
Comparison: None.

CLINICAL DATA: Head trauma.  Ataxia.  Fall 2 days ago.

EXAM:
CT HEAD WITHOUT CONTRAST
TECHNIQUE: Contiguous axial images were obtained from the base of the skull
through the vertex without intravenous contrast.

[Series 2: head wo · axial · 0.40mm/px · z∈[-110,+15]mm · 9 of 30 slices shown, 12 images]
[im 3/30  brain]
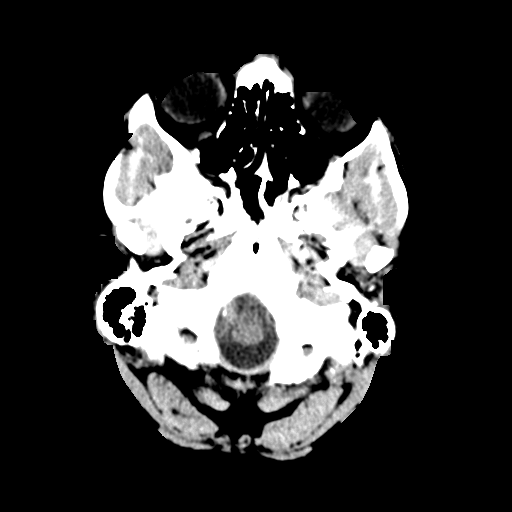
[im 3/30  bone]
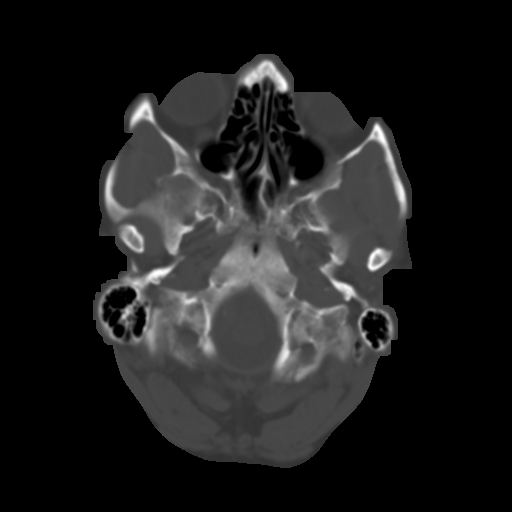
[im 6/30  brain]
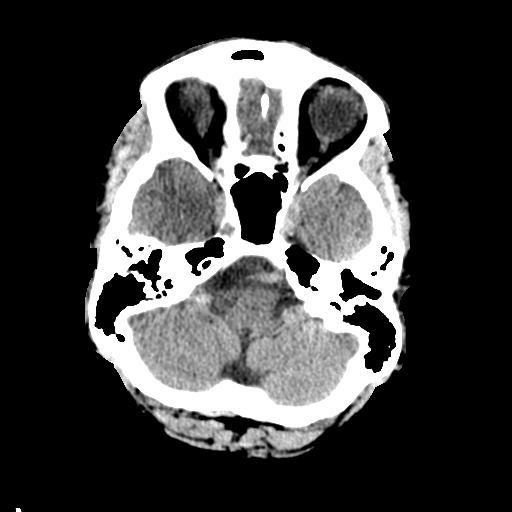
[im 9/30  brain]
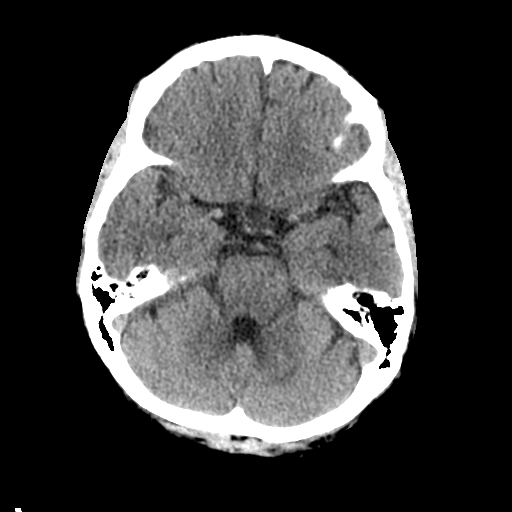
[im 12/30  brain]
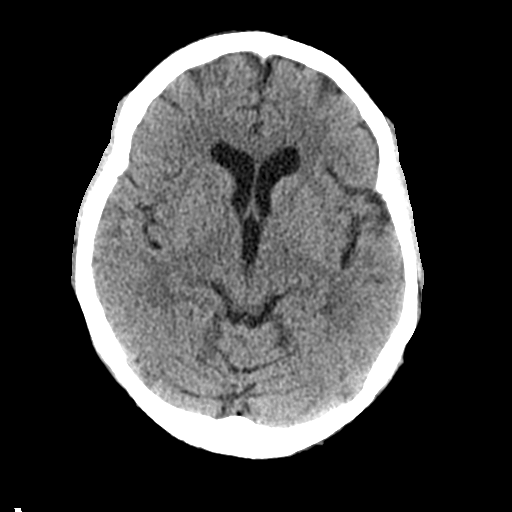
[im 16/30  brain]
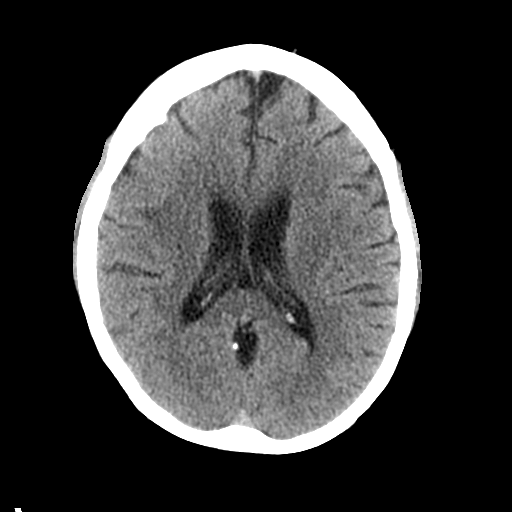
[im 16/30  bone]
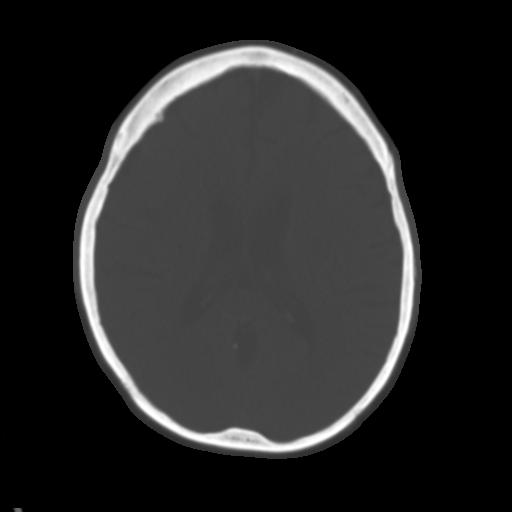
[im 19/30  brain]
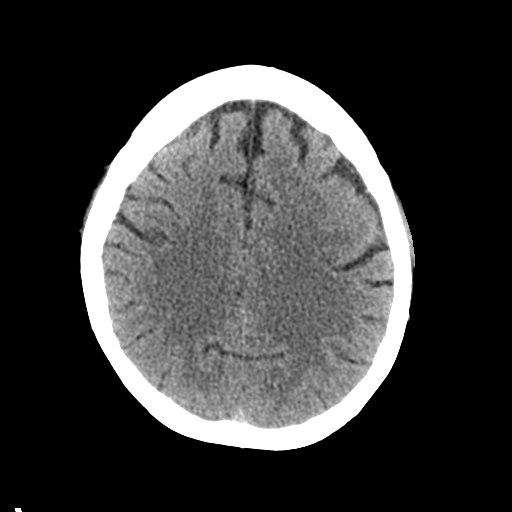
[im 22/30  brain]
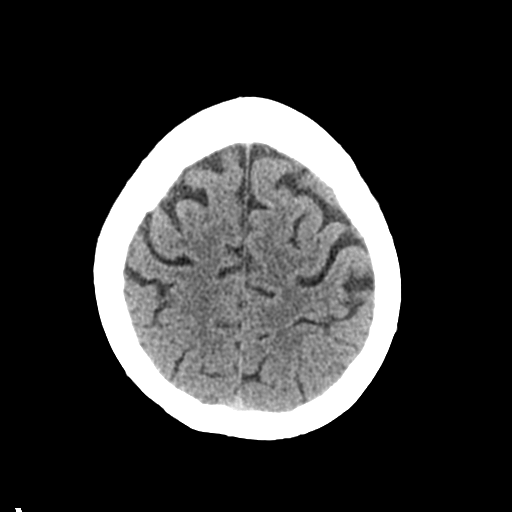
[im 25/30  brain]
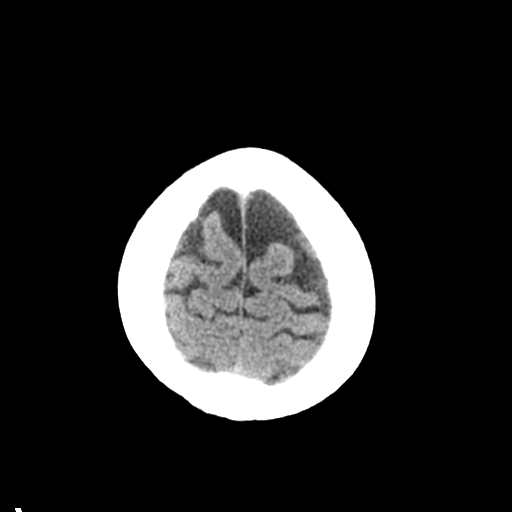
[im 28/30  brain]
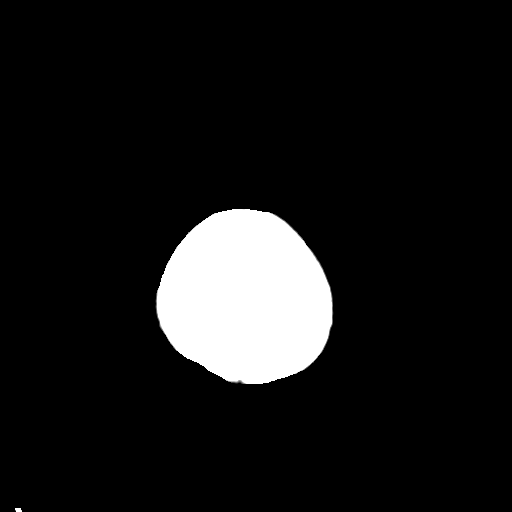
[im 28/30  bone]
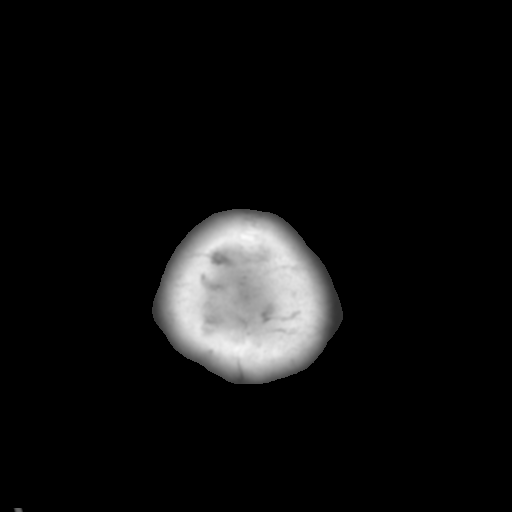

[Series 4: coronal soft tissue · coronal · 0.31mm/px · 3 of 60 slices shown]
[im 20/60  brain]
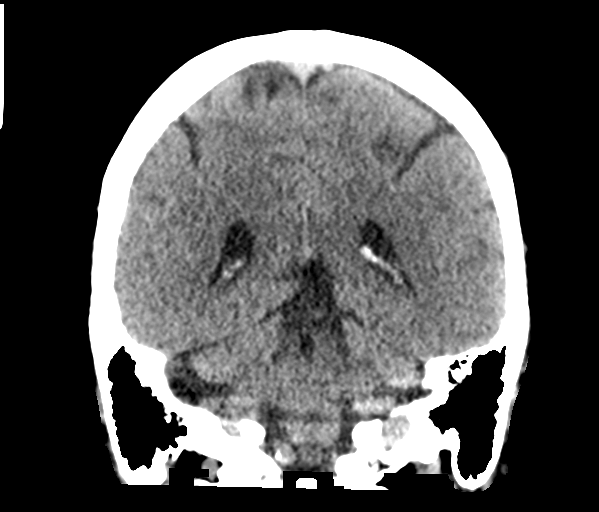
[im 27/60  brain]
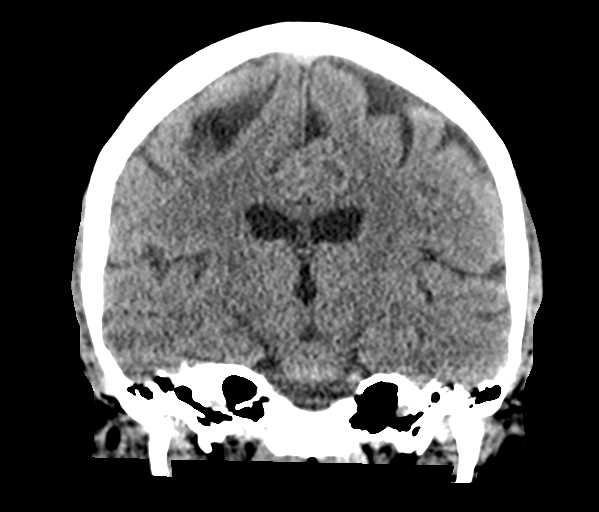
[im 33/60  brain]
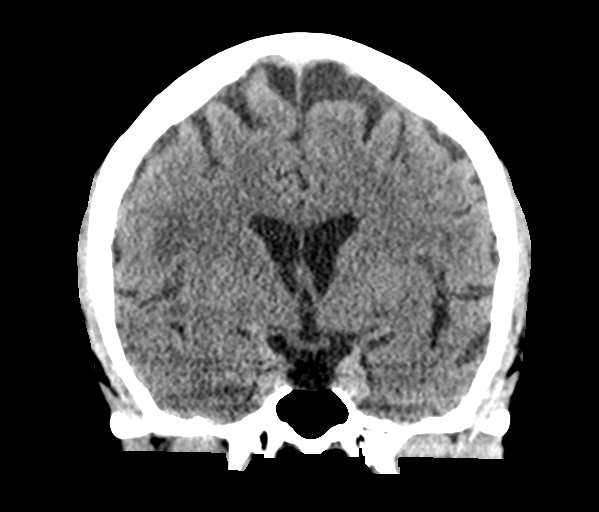

[Series 5: sagittal soft tissue · sagittal · 0.33mm/px · 3 of 52 slices shown]
[im 18/52  brain]
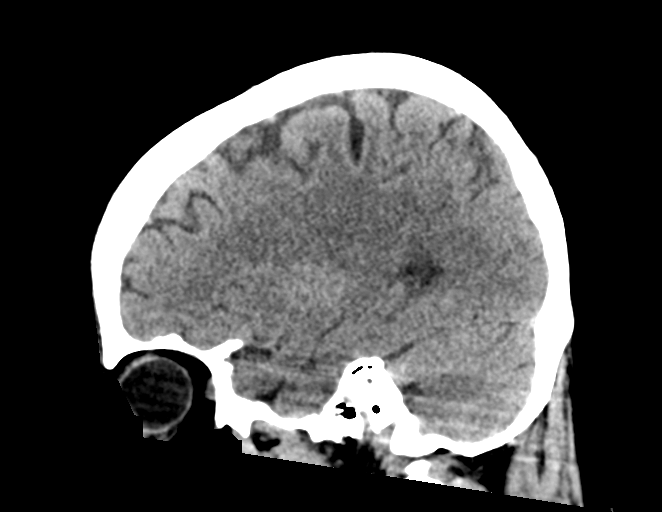
[im 26/52  brain]
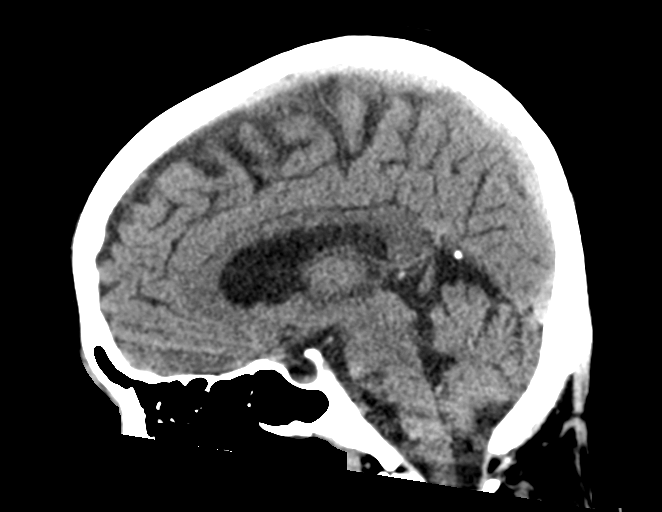
[im 35/52  brain]
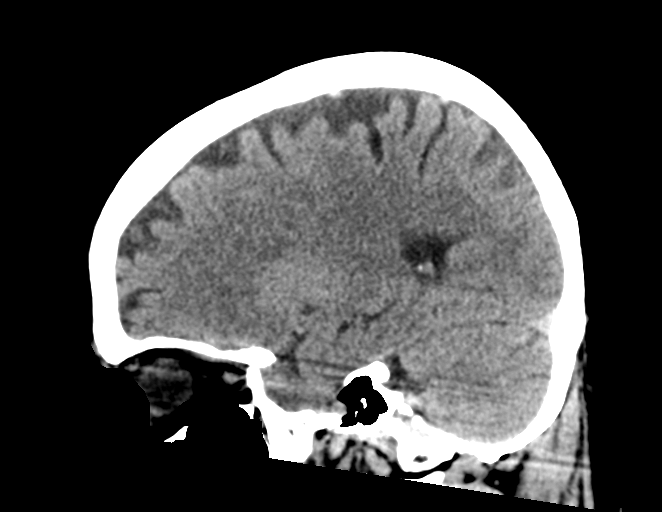

[15 of 47 positions shown; findings below may reference images not displayed]

FINDINGS: Brain: No acute intracranial abnormality. Specifically, no
hemorrhage, hydrocephalus, mass lesion, acute infarction, or
significant intracranial injury.

Vascular: No hyperdense vessel or unexpected calcification.

Skull: No acute calvarial abnormality.

Sinuses/Orbits: Visualized paranasal sinuses and mastoids clear.
Orbital soft tissues unremarkable.

Other: None
IMPRESSION: No acute intracranial abnormality.

## 2020-01-23 ENCOUNTER — Ambulatory Visit
Admission: RE | Admit: 2020-01-23 | Discharge: 2020-01-23 | Disposition: A | Discharge status: Home / Self Care | Payer: Medicare Other | Source: Ambulatory Visit | Attending: Primary Care | Admitting: Primary Care

## 2020-01-23 ENCOUNTER — Other Ambulatory Visit: Payer: Self-pay

## 2020-01-23 DIAGNOSIS — R911 Solitary pulmonary nodule: Secondary | ICD-10-CM | POA: Insufficient documentation

## 2020-02-07 ENCOUNTER — Ambulatory Visit: Payer: Medicare Other | Attending: Internal Medicine

## 2020-02-07 DIAGNOSIS — Z23 Encounter for immunization: Secondary | ICD-10-CM

## 2020-02-07 NOTE — Progress Notes (Signed)
   Covid-19 Vaccination Clinic  Name:  Merilynn Mettert    MRN: [id number] DOB: 1946/01/27  02/07/2020  Ms. Gering was observed post Covid-19 immunization for 15 minutes without incident. She was provided with Vaccine Information Sheet and instruction to access the V-Safe system.   Ms. Sanjurjo was instructed to call 911 with any severe reactions post vaccine: Marland Kitchen Difficulty breathing  . Swelling of face and throat  . A fast heartbeat  . A bad rash all over body  . Dizziness and weakness   Immunizations Administered    Name Date Dose VIS Date Route   Pfizer COVID-19 Vaccine 02/07/2020 12:44 PM 0.3 mL 12/13/2018 Intramuscular   Manufacturer: [place]   Lot: [id number]   [place]: [id number]

## 2020-02-08 ENCOUNTER — Other Ambulatory Visit (INDEPENDENT_AMBULATORY_CARE_PROVIDER_SITE_OTHER): Payer: Medicare Other

## 2020-02-08 DIAGNOSIS — N183 Chronic kidney disease, stage 3 unspecified: Secondary | ICD-10-CM | POA: Diagnosis not present

## 2020-02-08 DIAGNOSIS — E039 Hypothyroidism, unspecified: Secondary | ICD-10-CM | POA: Diagnosis not present

## 2020-02-08 LAB — BASIC METABOLIC PANEL
BUN: 17 mg/dL (ref 6–23)
CO2: 31 mEq/L (ref 19–32)
Calcium: 10 mg/dL (ref 8.4–10.5)
Chloride: 105 mEq/L (ref 96–112)
Creatinine, Ser: 1.22 mg/dL — ABNORMAL HIGH (ref 0.40–1.20)
GFR: 43.12 mL/min — ABNORMAL LOW (ref >60.00)
Glucose, Bld: 82 mg/dL (ref 70–99)
Potassium: 4.3 mEq/L (ref 3.5–5.1)
Sodium: 142 mEq/L (ref 135–145)

## 2020-02-08 LAB — TSH: TSH: 2.82 u[IU]/mL (ref 0.35–4.50)

## 2020-03-28 ENCOUNTER — Other Ambulatory Visit: Payer: Self-pay | Admitting: Primary Care

## 2020-03-28 DIAGNOSIS — E038 Other specified hypothyroidism: Secondary | ICD-10-CM

## 2020-04-09 ENCOUNTER — Ambulatory Visit: Payer: Medicare Other | Admitting: Primary Care

## 2020-04-09 ENCOUNTER — Other Ambulatory Visit: Payer: Medicare Other

## 2020-04-16 ENCOUNTER — Other Ambulatory Visit (INDEPENDENT_AMBULATORY_CARE_PROVIDER_SITE_OTHER): Payer: Medicare Other

## 2020-04-16 DIAGNOSIS — E038 Other specified hypothyroidism: Secondary | ICD-10-CM

## 2020-04-16 DIAGNOSIS — N289 Disorder of kidney and ureter, unspecified: Secondary | ICD-10-CM | POA: Diagnosis not present

## 2020-04-16 DIAGNOSIS — N183 Chronic kidney disease, stage 3 unspecified: Secondary | ICD-10-CM

## 2020-04-16 LAB — TSH: TSH: 1.96 u[IU]/mL (ref 0.35–4.50)

## 2020-04-16 LAB — BASIC METABOLIC PANEL
BUN: 12 mg/dL (ref 6–23)
CO2: 28 mEq/L (ref 19–32)
Calcium: 10.2 mg/dL (ref 8.4–10.5)
Chloride: 106 mEq/L (ref 96–112)
Creatinine, Ser: 1.25 mg/dL — ABNORMAL HIGH (ref 0.40–1.20)
GFR: 41.9 mL/min — ABNORMAL LOW (ref >60.00)
Glucose, Bld: 97 mg/dL (ref 70–99)
Potassium: 4 mEq/L (ref 3.5–5.1)
Sodium: 142 mEq/L (ref 135–145)

## 2020-04-17 MED ORDER — LISINOPRIL 2.5 MG PO TABS: 2.5000 mg | ORAL_TABLET | Freq: Every day | ORAL | 0 refills | Status: DC

## 2020-04-18 DIAGNOSIS — R911 Solitary pulmonary nodule: Secondary | ICD-10-CM

## 2020-04-29 NOTE — Telephone Encounter (Signed)
Grace Ortiz, will you cancel this patient's lab appointment for this week?

## 2020-04-30 ENCOUNTER — Ambulatory Visit
Admission: RE | Admit: 2020-04-30 | Discharge: 2020-04-30 | Disposition: A | Discharge status: Home / Self Care | Payer: Medicare Other | Source: Ambulatory Visit | Attending: Primary Care | Admitting: Primary Care

## 2020-04-30 ENCOUNTER — Other Ambulatory Visit: Payer: Self-pay

## 2020-04-30 DIAGNOSIS — K449 Diaphragmatic hernia without obstruction or gangrene: Secondary | ICD-10-CM | POA: Diagnosis not present

## 2020-04-30 DIAGNOSIS — R911 Solitary pulmonary nodule: Secondary | ICD-10-CM | POA: Insufficient documentation

## 2020-04-30 DIAGNOSIS — I700 Atherosclerosis of aorta: Secondary | ICD-10-CM | POA: Diagnosis not present

## 2020-04-30 DIAGNOSIS — Q2547 Right aortic arch: Secondary | ICD-10-CM | POA: Diagnosis not present

## 2020-04-30 DIAGNOSIS — E038 Other specified hypothyroidism: Secondary | ICD-10-CM

## 2020-04-30 DIAGNOSIS — K228 Other specified diseases of esophagus: Secondary | ICD-10-CM | POA: Diagnosis not present

## 2020-05-01 ENCOUNTER — Ambulatory Visit: Payer: Medicare Other | Admitting: Primary Care

## 2020-05-01 MED ORDER — LEVOTHYROXINE SODIUM 50 MCG PO TABS: ORAL_TABLET | ORAL | 1 refills | Status: DC

## 2020-05-03 ENCOUNTER — Ambulatory Visit: Payer: Medicare Other | Admitting: Primary Care

## 2020-05-09 ENCOUNTER — Other Ambulatory Visit: Payer: Self-pay | Admitting: Primary Care

## 2020-05-09 DIAGNOSIS — N183 Chronic kidney disease, stage 3 unspecified: Secondary | ICD-10-CM

## 2020-05-14 ENCOUNTER — Other Ambulatory Visit: Payer: Self-pay

## 2020-05-14 ENCOUNTER — Ambulatory Visit (INDEPENDENT_AMBULATORY_CARE_PROVIDER_SITE_OTHER): Payer: Medicare Other | Admitting: Primary Care

## 2020-05-14 VITALS — BP 124/74 | HR 78 | Temp 96.6°F | Ht 59.0 in | Wt 161.2 lb

## 2020-05-14 DIAGNOSIS — R05 Cough: Secondary | ICD-10-CM

## 2020-05-14 DIAGNOSIS — R911 Solitary pulmonary nodule: Secondary | ICD-10-CM | POA: Diagnosis not present

## 2020-05-14 DIAGNOSIS — R059 Cough, unspecified: Secondary | ICD-10-CM

## 2020-05-14 DIAGNOSIS — K219 Gastro-esophageal reflux disease without esophagitis: Secondary | ICD-10-CM | POA: Diagnosis not present

## 2020-05-14 MED ORDER — OMEPRAZOLE 20 MG PO CPDR: 20.0000 mg | DELAYED_RELEASE_CAPSULE | Freq: Every day | ORAL | 0 refills | Status: DC

## 2020-05-14 NOTE — Progress Notes (Signed)
Subjective:    Patient ID: Grace Ortiz, female    DOB: 28-Jun-1946, 74 y.o.   MRN: [id number]  HPI  This visit occurred during the SARS-CoV-2 public health emergency.  Safety protocols were in place, including screening questions prior to the visit, additional usage of staff PPE, and extensive cleaning of exam room while observing appropriate contact time as indicated for disinfecting solutions.   Grace Ortiz is a 74 year old female with a history of GERD, hypothyroidism, CKD, frequent headaches who presents today with a chief complaint of cough.  Her cough began in early June 2021 for which she thought was a "cold". Symptoms included nasal congestion, runny nose, sinus pressure. She took a series of OTC cough medications with improvement but without resolve.  She was then initiated on 2.5 mg of lisinopril for renal protection, continued to notice a cough. The cough changed to a tickle to her throat with coughing spells. Her lisinopril was discontinued three weeks ago, but cough didn't resolve.  Over the last several weeks she continues to notice a dry cough. She does notice post nasal drip, nasal congestion, esophageal burning with reflux twice weekly. Cough is sometimes worse when laying down and will lay in the recliner, but cough is consistent during the day.   She takes Tums with resolve in esophageal reflux. She smoked in her late teens and early 20's, has not smoked since. We weaned her off of omeprazole in January 2021. She does not take daily allergy medication.   She underwent CT chest initially in early April 2021 which revealed a stable 1 cm soft tissue nodule within the lingula; and additional scattered sub 4 mm nodules which are "ground glass" which could be inflammatory or infections. Repeat CT chest was recommended 3 months later.  She underwent repeat CT chest in mid July 2021 which revealed an unchanged 9 mm nodule in the lingula; unchanged nodules that were largely ground  glass which were considered postinfectious or inflammatory.     Review of Systems  HENT: Positive for congestion and postnasal drip.   Respiratory: Positive for cough. Negative for shortness of breath.   Gastrointestinal:       Esophageal reflux   Allergic/Immunologic: Positive for environmental allergies.       Past Medical History:  Diagnosis Date  . Acute UTI 08/02/2018  . Arthritis   . Bell's palsy   . Cervical polyp   . Diverticulitis   . Frequent headaches   . GERD (gastroesophageal reflux disease)   . Hyperlipidemia   . Hypothyroidism   . Macular retinal puckering, bilateral   . Migraines      Social History   Socioeconomic History  . Marital status: Married    Spouse name: Not on file  . Number of children: Not on file  . Years of education: Not on file  . Highest education level: Not on file  Occupational History  . Not on file  Tobacco Use  . Smoking status: Former Research scientist (life sciences)  . Smokeless tobacco: Never Used  Vaping Use  . Vaping Use: Never used  Substance and Sexual Activity  . Alcohol use: Yes    Comment: rarely  . Drug use: No  . Sexual activity: Not on file  Other Topics Concern  . Not on file  Social History Narrative   Married.   No children.   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    Moved from [place].   Social Determinants of Health   Financial  Resource Strain: Low Risk   . Difficulty of Paying Living Expenses: Not hard at all  Food Insecurity: No Food Insecurity  . Worried About Charity fundraiser in the Last Year: Never true  . Ran Out of Food in the Last Year: Never true  Transportation Needs: No Transportation Needs  . Lack of Transportation (Medical): No  . Lack of Transportation (Non-Medical): No  Physical Activity: Inactive  . Days of Exercise per Week: 0 days  . Minutes of Exercise per Session: 0 min  Stress: No Stress Concern Present  . Feeling of Stress : Not at all  Social Connections:   . Frequency of  Communication with Friends and Family:   . Frequency of Social Gatherings with Friends and Family:   . Attends Religious Services:   . Active Member of Clubs or Organizations:   . Attends Archivist Meetings:   Marland Kitchen Marital Status:   Intimate Partner Violence: Not At Risk  . Fear of Current or Ex-Partner: No  . Emotionally Abused: No  . Physically Abused: No  . Sexually Abused: No    Past Surgical History:  Procedure Laterality Date  . BLEPHAROPLASTY  2008, 2009  . BREAST EXCISIONAL BIOPSY Left 05/1993   neg  . BREAST EXCISIONAL BIOPSY Left 09/1993   neg  . CATARACT EXTRACTION  2008  . CERVICAL POLYPECTOMY  2014  . COLONOSCOPY    . COLONOSCOPY WITH PROPOFOL N/A 12/25/2019   Procedure: COLONOSCOPY WITH PROPOFOL;  Surgeon: Jonathon Bellows, MD;  Location: [facility] ENDOSCOPY;  Service: Gastroenterology;  Laterality: N/A;  . DILATION AND CURETTAGE OF UTERUS    . HYSTEROSCOPY  2014  . UPPER ENDOSCOPY W/ ANTRODUODENAL MANOMETRY      Family History  Problem Relation Age of Onset  . Breast cancer Maternal Grandmother 30  . Cancer Maternal Grandmother        breast  . Early death Maternal Grandmother   . Hearing loss Mother   . Heart disease Mother   . Cancer Father        colon  . Hearing loss Father   . Diabetes Sister   . Early death Paternal Grandfather     No Known Allergies  Current Outpatient Medications on File Prior to Visit  Medication Sig Dispense Refill  . Ascorbic Acid (VITAMIN C PO) Take 1,000 Units by mouth daily.    Marland Kitchen aspirin EC 81 MG tablet Take 81 mg by mouth daily.    Marland Kitchen buPROPion (WELLBUTRIN XL) 300 MG 24 hr tablet TAKE 1 TABLET (300 MG TOTAL) BY MOUTH DAILY. FOR ANXIETY/DEPRESSION. 90 tablet 3  . Cholecalciferol (VITAMIN D3 PO) Take 1,200 mg by mouth daily.    . citalopram (CELEXA) 40 MG tablet TAKE 1 TABLET (40 MG TOTAL) BY MOUTH DAILY. FOR ANXIETY/DEPRESSION. 90 tablet 3  . Cyanocobalamin (VITAMIN B12 PO) Take 2,500 mcg by mouth daily.    Marland Kitchen  levothyroxine (SYNTHROID) 50 MCG tablet TAKE 1 TABLET BY on Sunday ON EMPTY STOMACH WITH WATER, NO FOOD OR OTHER MEDS FOR 30 MIN 12 tablet 1  . levothyroxine (SYNTHROID) 75 MCG tablet TAKE 1 TABLET BY MOUTH ON Monday through [place], NO FOOD OR OTHER MEDS FOR 30 MIN 72 tablet 1  . Omega-3 Fatty Acids (FISH OIL PO) Take 360 mg by mouth daily.    . polyethylene glycol (MIRALAX / GLYCOLAX) packet Take 17 g by mouth daily.     . prochlorperazine (COMPAZINE) 10 MG tablet Take 1  tablet (10 mg total) by mouth every 6 (six) hours as needed for nausea or vomiting. 30 tablet 0  . simvastatin (ZOCOR) 10 MG tablet TAKE 1 TABLET (10 MG TOTAL) BY MOUTH EVERY EVENING. FOR CHOLESTEROL. 90 tablet 3  . traZODone (DESYREL) 50 MG tablet Take 1 tablet (50 mg total) by mouth at bedtime as needed for sleep. 90 tablet 3  . Tretinoin Microsphere (RETIN-A MICRO PUMP) 0.06 % GEL 1 Pump every other day.      No current facility-administered medications on file prior to visit.    BP 124/74   Pulse 78   Temp (!) 96.6 F (35.9 C) (Temporal)   Ht 4\' 11"  (1.499 m)   Wt 161 lb 4 oz (73.1 kg)   SpO2 98%   BMI 32.57 kg/m    Objective:   Physical Exam HENT:     Head:     Comments: Throat clearing quite often during visit Cardiovascular:     Rate and Rhythm: Normal rate and regular rhythm.  Pulmonary:     Effort: Pulmonary effort is normal.     Breath sounds: Normal breath sounds.     Comments: Slightly wet cough noted during visit a few times. Musculoskeletal:     Cervical back: Neck supple.  Skin:    General: Skin is warm and dry.            Assessment & Plan:

## 2020-05-14 NOTE — Patient Instructions (Signed)
Start omeprazole 20 mg once daily for heartburn and cough.  Resume either Claritin/Zyrtec/Allegra once daily for allergies, drainage, and cough.  You will be contacted regarding your referral to pulmonology.  Please let us know if you have not been contacted within two weeks.   It was a pleasure to see you today!

## 2020-05-14 NOTE — Assessment & Plan Note (Signed)
Acute since early June 2021, continued.  Suspect allergy and/or GERD etiology, especially given symptoms of reflux and her constantly clearing her throat during our visit.   The ACE-I initiated for renal protection certainly contributed, but this has been discontinued for three weeks.  Resume omeprazole 20 mg daily. Start daily antihistamine.  Referral placed to pulmonology given CT lung findings.

## 2020-05-20 ENCOUNTER — Institutional Professional Consult (permissible substitution): Payer: Medicare Other | Admitting: Pulmonary Disease

## 2020-05-27 ENCOUNTER — Other Ambulatory Visit: Payer: Self-pay | Admitting: Primary Care

## 2020-05-27 DIAGNOSIS — K219 Gastro-esophageal reflux disease without esophagitis: Secondary | ICD-10-CM

## 2020-05-27 DIAGNOSIS — E038 Other specified hypothyroidism: Secondary | ICD-10-CM

## 2020-06-05 DIAGNOSIS — N183 Chronic kidney disease, stage 3 unspecified: Secondary | ICD-10-CM

## 2020-06-05 DIAGNOSIS — E038 Other specified hypothyroidism: Secondary | ICD-10-CM

## 2020-06-07 MED ORDER — LOSARTAN POTASSIUM 25 MG PO TABS: 25.0000 mg | ORAL_TABLET | Freq: Every day | ORAL | 0 refills | Status: DC

## 2020-06-12 ENCOUNTER — Other Ambulatory Visit: Payer: Self-pay | Admitting: Primary Care

## 2020-06-12 DIAGNOSIS — Z1231 Encounter for screening mammogram for malignant neoplasm of breast: Secondary | ICD-10-CM

## 2020-06-17 DIAGNOSIS — D2262 Melanocytic nevi of left upper limb, including shoulder: Secondary | ICD-10-CM | POA: Diagnosis not present

## 2020-06-17 DIAGNOSIS — D225 Melanocytic nevi of trunk: Secondary | ICD-10-CM | POA: Diagnosis not present

## 2020-06-17 DIAGNOSIS — L821 Other seborrheic keratosis: Secondary | ICD-10-CM | POA: Diagnosis not present

## 2020-06-17 DIAGNOSIS — D2261 Melanocytic nevi of right upper limb, including shoulder: Secondary | ICD-10-CM | POA: Diagnosis not present

## 2020-06-17 DIAGNOSIS — D2271 Melanocytic nevi of right lower limb, including hip: Secondary | ICD-10-CM | POA: Diagnosis not present

## 2020-06-17 DIAGNOSIS — D2272 Melanocytic nevi of left lower limb, including hip: Secondary | ICD-10-CM | POA: Diagnosis not present

## 2020-06-21 ENCOUNTER — Ambulatory Visit (INDEPENDENT_AMBULATORY_CARE_PROVIDER_SITE_OTHER): Payer: Medicare Other | Admitting: Internal Medicine

## 2020-06-21 ENCOUNTER — Encounter: Payer: Self-pay | Admitting: Internal Medicine

## 2020-06-21 ENCOUNTER — Other Ambulatory Visit: Payer: Self-pay

## 2020-06-21 VITALS — BP 150/84 | HR 65 | Temp 97.3°F | Ht 59.0 in | Wt 159.0 lb

## 2020-06-21 DIAGNOSIS — Z87891 Personal history of nicotine dependence: Secondary | ICD-10-CM

## 2020-06-21 DIAGNOSIS — R05 Cough: Secondary | ICD-10-CM | POA: Diagnosis not present

## 2020-06-21 DIAGNOSIS — R911 Solitary pulmonary nodule: Secondary | ICD-10-CM | POA: Diagnosis not present

## 2020-06-21 DIAGNOSIS — R053 Chronic cough: Secondary | ICD-10-CM

## 2020-06-21 NOTE — Progress Notes (Signed)
OV 06/21/2020  Subjective:  Patient ID: Grace Ortiz, female , DOB: Jun 29, 1946 , age 74 y.o. , MRN: [id number] , ADDRESS: [address] [place] [id number] PCP Pleas Koch, NP   06/21/2020 -   Chief Complaint  Patient presents with   Pulmonary Consult    Referred nu Dr. Alma Friendly for eval of pulmonary nodule of CT Chest.  Pt c/o cough for the past 2 months-prod at times with clear sputum.      HPI Grace Ortiz 74 y.o. -female referred by the primary care physician.  She has a long history of chronic cough in relation with chronic sinus drainage and also hiatal hernia for which she is on acid reflux control.  She has constant clearing of the throat.  This is baseline.  It is mild to moderate.  She feels comfortable with her quality of symptoms and quality of symptoms.  She does not want this addressed at this visit.  She reports that in March 2020 when she had an abdominal CT for suspicious kidney lesion but this ended up not being so.  During the CT scan incidental 1 cm lingular nodule was discovered.  This was followed up with a CT chest April 2021 and a follow-up mid July 2021.  I visualized all the CT scans.  There is definitely a 1 cm lingular nodule close to the heart border.  It appears stable is noncalcified but it is dense.  There is no spiculation.  The other scattered groundglass opacities.  She does have a smoking history but is very limited and in the remote past.  She is not having any hemoptysis or shortness of breath.  There is no orthopnea proximal nocturnal dyspnea.    CT chest 04/30/20  Narrative & Impression  CLINICAL DATA:  Follow-up lung nodule. Productive cough. Remote smoking history.  EXAM: CT CHEST WITHOUT CONTRAST  TECHNIQUE: Multidetector CT imaging of the chest was performed following the standard protocol without IV contrast.  COMPARISON:  01/23/2020  FINDINGS: Cardiovascular: Right-sided aortic arch with potential  compression of the esophagus between the arch and trachea. Normal caliber of the thoracic aorta. Normal heart size. No pericardial effusion.  Mediastinum/Nodes: No enlarged axillary, mediastinal, or hilar lymph nodes identified with hilar assessment limited in the absence of IV contrast. Unchanged moderate-sized sliding hiatal hernia. Unremarkable thyroid.  Lungs/Pleura: No pleural effusion or pneumothorax. A 10 x 8 mm (mean 9 mm) nodule in the lingula is unchanged (series 3, image 105). Numerous other nodules measuring less than 5 mm in both lungs are also unchanged and are largely ground-glass in density. No new nodule is identified.  Upper Abdomen: Partially visualized 1.8 cm low-density lesions in both kidneys, similar to the prior study and compatible with cysts.  Musculoskeletal: No acute osseous abnormality or suspicious osseous lesion. Thoracic spondylosis.  IMPRESSION: 1. Unchanged 9 mm nodule in the lingula. Consider follow-up CT in 6-9 months to assess for continued stability. 2. Unchanged small nodules elsewhere which are largely ground-glass and may be postinfectious or inflammatory. 3. Right-sided aortic arch. 4. Moderate-sized hiatal hernia. 5. Aortic Atherosclerosis (ICD10-I70.0).   Electronically Signed   By: Logan Bores M.D.   On: 04/30/2020 15:17      ROS - per HPI     has a past medical history of Acute UTI (08/02/2018), Arthritis, Bell's palsy, Cervical polyp, Diverticulitis, Frequent headaches, GERD (gastroesophageal reflux disease), Hyperlipidemia, Hypothyroidism, Macular retinal puckering, bilateral, and Migraines.   reports that she quit  smoking about 49 years ago. Her smoking use included cigarettes. She has a 6.00 pack-year smoking history. She has never used smokeless tobacco.  Past Surgical History:  Procedure Laterality Date   BLEPHAROPLASTY  2008, 2009   BREAST EXCISIONAL BIOPSY Left 05/1993   neg   BREAST EXCISIONAL  BIOPSY Left 09/1993   neg   CATARACT EXTRACTION  2008   CERVICAL POLYPECTOMY  2014   COLONOSCOPY     COLONOSCOPY WITH PROPOFOL N/A 12/25/2019   Procedure: COLONOSCOPY WITH PROPOFOL;  Surgeon: Jonathon Bellows, MD;  Location: [facility]COPY;  Service: Gastroenterology;  Laterality: N/A;   DILATION AND CURETTAGE OF UTERUS     HYSTEROSCOPY  2014   UPPER ENDOSCOPY W/ ANTRODUODENAL MANOMETRY      No Known Allergies  Immunization History  Administered Date(s) Administered   Influenza Split 08/06/2009   Influenza, High Dose Seasonal PF 08/31/2012, 07/26/2013, 08/01/2014, 07/31/2015, 08/05/2016, 07/29/2018, 06/09/2019   Influenza,inj,Quad PF,6+ Mos 06/30/2017   Influenza-Unspecified 07/29/2018, 06/09/2019   PFIZER SARS-COV-2 Vaccination 01/15/2020, 02/07/2020   Pneumococcal Conjugate-13 06/30/2012, 10/25/2013   Pneumococcal Polysaccharide-23 09/07/2012, 07/27/2016   Tdap 10/29/2015, 06/01/2016   Zoster 02/11/2011   Zoster Recombinat (Shingrix) 11/10/2019    Family History  Problem Relation Age of Onset   Breast cancer Maternal Grandmother 27   Cancer Maternal Grandmother        breast   Early death Maternal Grandmother    Hearing loss Mother    Heart disease Mother    Cancer Father        colon   Hearing loss Father    Diabetes Sister    Early death Paternal Grandfather      Current Outpatient Medications:    Ascorbic Acid (VITAMIN C PO), Take 1,000 Units by mouth daily., Disp: , Rfl:    aspirin EC 81 MG tablet, Take 81 mg by mouth daily., Disp: , Rfl:    buPROPion (WELLBUTRIN XL) 300 MG 24 hr tablet, TAKE 1 TABLET (300 MG TOTAL) BY MOUTH DAILY. FOR ANXIETY/DEPRESSION., Disp: 90 tablet, Rfl: 3   Cholecalciferol (VITAMIN D3 PO), Take 1,200 mg by mouth daily., Disp: , Rfl:    citalopram (CELEXA) 40 MG tablet, TAKE 1 TABLET (40 MG TOTAL) BY MOUTH DAILY. FOR ANXIETY/DEPRESSION., Disp: 90 tablet, Rfl: 3   Cyanocobalamin (VITAMIN B12 PO), Take 2,500 mcg by  mouth daily., Disp: , Rfl:    levothyroxine (SYNTHROID) 50 MCG tablet, TAKE 1 TABLET BY on Sunday ON EMPTY STOMACH WITH WATER, NO FOOD OR OTHER MEDS FOR 30 MIN, Disp: 12 tablet, Rfl: 1   levothyroxine (SYNTHROID) 75 MCG tablet, TAKE 1 TABLET BY MOUTH ON Monday through [place], NO FOOD OR OTHER MEDS FOR 30 MIN, Disp: 72 tablet, Rfl: 1   losartan (COZAAR) 25 MG tablet, Take 1 tablet (25 mg total) by mouth daily. For kidney protection., Disp: 30 tablet, Rfl: 0   Omega-3 Fatty Acids (FISH OIL PO), Take 360 mg by mouth daily., Disp: , Rfl:    omeprazole (PRILOSEC) 20 MG capsule, Take 1 capsule (20 mg total) by mouth daily. For heartburn., Disp: 90 capsule, Rfl: 0   polyethylene glycol (MIRALAX / GLYCOLAX) packet, Take 17 g by mouth daily. , Disp: , Rfl:    prochlorperazine (COMPAZINE) 10 MG tablet, Take 1 tablet (10 mg total) by mouth every 6 (six) hours as needed for nausea or vomiting., Disp: 30 tablet, Rfl: 0   simvastatin (ZOCOR) 10 MG tablet, TAKE 1 TABLET (10 MG TOTAL) BY  MOUTH EVERY EVENING. FOR CHOLESTEROL., Disp: 90 tablet, Rfl: 3   traZODone (DESYREL) 50 MG tablet, Take 1 tablet (50 mg total) by mouth at bedtime as needed for sleep., Disp: 90 tablet, Rfl: 3      Objective:   Vitals:   06/21/20 0930  BP: (!) 150/84  Pulse: 65  Temp: (!) 97.3 F (36.3 C)  TempSrc: Temporal  SpO2: 95%  Weight: 159 lb (72.1 kg)  Height: 4\' 11"  (1.499 m)    Estimated body mass index is 32.11 kg/m as calculated from the following:   Height as of this encounter: 4\' 11"  (1.499 m).   Weight as of this encounter: 159 lb (72.1 kg).  @WEIGHTCHANGE @  [employer]   06/21/20 0930  Weight: 159 lb (72.1 kg)     Physical Exam  General Appearance:    Alert, cooperative, no distress, appears stated age - yes , Deconditioned looking - no , OBESE  - yes, Sitting on Wheelchair -  no  Head:    Normocephalic, without obvious abnormality, atraumatic  Eyes:    PERRL,  conjunctiva/corneas clear,  Ears:    Normal TM's and external ear canals, both ears  Nose:   Nares normal, septum midline, mucosa normal, no drainage    or sinus tenderness. OXYGEN ON  - no . Patient is @ ra   Throat:   Lips, mucosa, and tongue normal; teeth and gums normal. Cyanosis on lips - no  Neck:   Supple, symmetrical, trachea midline, no adenopathy;    thyroid:  no enlargement/tenderness/nodules; no carotid   bruit or JVD  Back:     Symmetric, no curvature, ROM normal, no CVA tenderness  Lungs:     Distress - no , Wheeze no, Barrell Chest - no, Purse lip breathing - no, Crackles - no   Chest Wall:    No tenderness or deformity.    Heart:    Regular rate and rhythm, S1 and S2 normal, no rub   or gallop, Murmur - no  Breast Exam:    NOT DONE  Abdomen:     Soft, non-tender, bowel sounds active all four quadrants,    no masses, no organomegaly. Visceral obesity - yes  Genitalia:   NOT DONE  Rectal:   NOT DONE  Extremities:   Extremities - normal, Has Cane - no, Clubbing - no, Edema - no  Pulses:   2+ and symmetric all extremities  Skin:   Stigmata of Connective Tissue Disease - no  Lymph nodes:   Cervical, supraclavicular, and axillary nodes normal  Psychiatric:  Neurologic:   Pleasant - yes, Anxious - no, Flat affect - no  CAm-ICU - neg, Alert and Oriented x 3 - yes, Moves all 4s - yes, Speech - normal, Cognition - intact           Assessment:       ICD-10-CM   1. Pulmonary nodule, left  R91.1 NM PET (AXUMIN) SKULL BASE TO MID THIGH  2. Chronic cough  R05 NM PET (AXUMIN) SKULL BASE TO MID THIGH  3. Smoking history  Z87.891 NM PET (AXUMIN) SKULL BASE TO MID THIGH       Plan:     Patient Instructions     ICD-10-CM   1. Pulmonary nodule, left  R91.1   2. Chronic cough  R05   3. Smoking history  Z87.891    #Nodule 1cm lung nodule stable April 2021 -> mid July 2021 Too small to biopsy accurately Blood tests  to tell us what it might be have limitations Given  limited smoking history and current stability, odds are it is benign but cancer cannot be ruled out  #cough  - likely due to sinus and gerd causing cough neuropathy  Plan  - PET scan mid-end Oct 2021 - expectant followup for cough based on  yoru request  Followup  - telephone or video visit with app or MD to review PET scan results and next steps     SIGNATURE    Dr. Brand Males, M.D., F.C.C.P,  Pulmonary and Critical Care Medicine Staff Physician, [place] Director - Interstitial Lung Disease  Program  Pulmonary [facility] [place], [place], [address]  Pager: [phone], If no answer or between  15:00h - 7:00h: call [phone] Telephone: [phone]  10:13 AM 06/21/2020

## 2020-06-21 NOTE — Patient Instructions (Addendum)
ICD-10-CM   1. Pulmonary nodule, left  R91.1   2. Chronic cough  R05   3. Smoking history  Z87.891    #Nodule 1cm lung nodule stable April 2021 -> mid July 2021 Too small to biopsy accurately Blood tests to tell us what it might be have limitations Given limited smoking history and current stability, odds are it is benign but cancer cannot be ruled out  #cough  - likely due to sinus and gerd causing cough neuropathy  Plan  - PET scan mid-end Oct 2021 - expectant followup for cough based on  yoru request  Followup  - telephone or video visit with app or MD to review PET scan results and next steps

## 2020-06-23 ENCOUNTER — Other Ambulatory Visit: Payer: Self-pay | Admitting: Primary Care

## 2020-06-25 ENCOUNTER — Ambulatory Visit: Payer: Medicare Other | Admitting: Primary Care

## 2020-06-25 ENCOUNTER — Other Ambulatory Visit: Payer: Self-pay

## 2020-06-25 ENCOUNTER — Other Ambulatory Visit (INDEPENDENT_AMBULATORY_CARE_PROVIDER_SITE_OTHER): Payer: Medicare Other

## 2020-06-25 DIAGNOSIS — E038 Other specified hypothyroidism: Secondary | ICD-10-CM | POA: Diagnosis not present

## 2020-06-25 DIAGNOSIS — N183 Chronic kidney disease, stage 3 unspecified: Secondary | ICD-10-CM

## 2020-06-25 DIAGNOSIS — Z23 Encounter for immunization: Secondary | ICD-10-CM | POA: Diagnosis not present

## 2020-06-25 LAB — BASIC METABOLIC PANEL
BUN: 17 mg/dL (ref 6–23)
CO2: 28 mEq/L (ref 19–32)
Calcium: 10.2 mg/dL (ref 8.4–10.5)
Chloride: 106 mEq/L (ref 96–112)
Creatinine, Ser: 1.08 mg/dL (ref 0.40–1.20)
GFR: 49.58 mL/min — ABNORMAL LOW (ref >60.00)
Glucose, Bld: 98 mg/dL (ref 70–99)
Potassium: 4.4 mEq/L (ref 3.5–5.1)
Sodium: 141 mEq/L (ref 135–145)

## 2020-06-25 LAB — TSH: TSH: 2.43 u[IU]/mL (ref 0.35–4.50)

## 2020-06-27 DIAGNOSIS — E038 Other specified hypothyroidism: Secondary | ICD-10-CM

## 2020-06-28 MED ORDER — LEVOTHYROXINE SODIUM 75 MCG PO TABS: ORAL_TABLET | ORAL | 1 refills | Status: DC

## 2020-06-29 ENCOUNTER — Other Ambulatory Visit: Payer: Self-pay | Admitting: Primary Care

## 2020-06-29 DIAGNOSIS — N183 Chronic kidney disease, stage 3 unspecified: Secondary | ICD-10-CM

## 2020-07-01 DIAGNOSIS — H6123 Impacted cerumen, bilateral: Secondary | ICD-10-CM | POA: Diagnosis not present

## 2020-07-01 DIAGNOSIS — H903 Sensorineural hearing loss, bilateral: Secondary | ICD-10-CM | POA: Diagnosis not present

## 2020-07-03 ENCOUNTER — Other Ambulatory Visit: Payer: Self-pay

## 2020-07-03 ENCOUNTER — Ambulatory Visit
Admission: RE | Admit: 2020-07-03 | Discharge: 2020-07-03 | Disposition: A | Discharge status: Home / Self Care | Payer: Medicare Other | Source: Ambulatory Visit | Attending: Primary Care | Admitting: Primary Care

## 2020-07-03 DIAGNOSIS — Z1231 Encounter for screening mammogram for malignant neoplasm of breast: Secondary | ICD-10-CM | POA: Diagnosis not present

## 2020-08-08 ENCOUNTER — Other Ambulatory Visit: Payer: Self-pay

## 2020-08-08 ENCOUNTER — Encounter
Admission: RE | Admit: 2020-08-08 | Discharge: 2020-08-08 | Disposition: A | Discharge status: Home / Self Care | Payer: Medicare Other | Source: Ambulatory Visit | Attending: Internal Medicine | Admitting: Internal Medicine

## 2020-08-08 DIAGNOSIS — R911 Solitary pulmonary nodule: Secondary | ICD-10-CM | POA: Diagnosis not present

## 2020-08-08 DIAGNOSIS — I2510 Atherosclerotic heart disease of native coronary artery without angina pectoris: Secondary | ICD-10-CM | POA: Insufficient documentation

## 2020-08-08 DIAGNOSIS — K449 Diaphragmatic hernia without obstruction or gangrene: Secondary | ICD-10-CM | POA: Insufficient documentation

## 2020-08-08 DIAGNOSIS — N200 Calculus of kidney: Secondary | ICD-10-CM | POA: Insufficient documentation

## 2020-08-08 DIAGNOSIS — R053 Chronic cough: Secondary | ICD-10-CM | POA: Diagnosis not present

## 2020-08-08 DIAGNOSIS — R059 Cough, unspecified: Secondary | ICD-10-CM | POA: Diagnosis not present

## 2020-08-08 DIAGNOSIS — Z87891 Personal history of nicotine dependence: Secondary | ICD-10-CM | POA: Diagnosis not present

## 2020-08-08 LAB — GLUCOSE, CAPILLARY: Glucose-Capillary: 105 mg/dL — ABNORMAL HIGH (ref 70–99)

## 2020-08-08 MED ORDER — FLUDEOXYGLUCOSE F - 18 (FDG) INJECTION
8.2000 | Freq: Once | INTRAVENOUS | Status: AC | PRN
  Administered 2020-08-08: 8.19 via INTRAVENOUS

## 2020-08-13 ENCOUNTER — Telehealth: Payer: Self-pay | Admitting: Internal Medicine

## 2020-08-13 DIAGNOSIS — R911 Solitary pulmonary nodule: Secondary | ICD-10-CM

## 2020-08-13 NOTE — Telephone Encounter (Signed)
PET scan results  1cm lung nodule stable April 2021 -> mid July 2021 -> benign behavior pn PET scan Oct 2021  SEveral other findings - see below  PLAN  - return in 1 year with CT chest without contrast - talk to PCP Carlis Abbott Leticia Penna, NP about . Other imaging findings of potential clinical significance: Right-sided aortic arch. Moderate-sized hiatal hernia. Right coronary artery atherosclerosis. Nonobstructive left nephrolithiasis. Spondylosis and degenerative disc disease.    CLINICAL DATA:  Initial treatment strategy for pulmonary nodule. Chronic cough.  EXAM: NUCLEAR MEDICINE PET SKULL BASE TO THIGH  TECHNIQUE: 8.2 mCi F-18 FDG was injected intravenously. Full-ring PET imaging was performed from the skull base to thigh after the radiotracer. CT data was obtained and used for attenuation correction and anatomic localization.  Fasting blood glucose: 105 mg/dl  COMPARISON:  CT chest 04/30/2020 and CT abdomen from 01/10/2020  FINDINGS: Mediastinal blood pool activity: SUV max 2.2  Liver activity: SUV max N/A  NECK: Asymmetric activity in the right longus capitis muscle, no associated CT findings, considered physiologic.  Incidental CT findings: None  CHEST: The pleural-based nodule along the lingula measures 1.0 by 0.8 cm on image 120 of series 3, maximum SUV 0.4.  Incidental CT findings: Right-sided aortic arch. Moderate-sized hiatal hernia. Right coronary artery atherosclerosis.  ABDOMEN/PELVIS: No significant abnormal hypermetabolic activity in this region.  Incidental CT findings: Left mid kidney hypodense lesion compatible with cyst. 3 mm left kidney lower pole nonobstructive renal calculus. Mild abdominal aortic atherosclerotic calcification at the bifurcation. Sigmoid colon diverticulosis.  SKELETON: No significant abnormal hypermetabolic activity in this region.  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  Cervical, thoracic, and lumbar spondylosis along with multilevel degenerative disc disease.  IMPRESSION: 1. The smooth oval-shaped 1.0 by 0.8 cm lingular nodule has a maximum SUV of only 0.4. Appearance strongly favors a benign process. Consider noncontrast CT chest follow up in 12-18 months as a precautionary measure against low-grade adenocarcinoma or low-grade carcinoid tumor. 2. Other imaging findings of potential clinical significance: Right-sided aortic arch. Moderate-sized hiatal hernia. Right coronary artery atherosclerosis. Nonobstructive left nephrolithiasis. Spondylosis and degenerative disc disease.   Electronically Signed   By: Van Clines M.D.   On: 08/08/2020 16:11

## 2020-08-13 NOTE — Telephone Encounter (Signed)
Please see phone note from 08/13/20. Will sign off.

## 2020-08-13 NOTE — Telephone Encounter (Signed)
Called and spoke to pt. Informed her of the results and recs per MR. Order placed for the CT for 07/2021. Pt verbalized understanding and denied any further questions or concerns at this time.

## 2020-08-15 DIAGNOSIS — R5382 Chronic fatigue, unspecified: Secondary | ICD-10-CM

## 2020-08-15 DIAGNOSIS — I2510 Atherosclerotic heart disease of native coronary artery without angina pectoris: Secondary | ICD-10-CM

## 2020-08-19 ENCOUNTER — Other Ambulatory Visit: Payer: Self-pay | Admitting: Primary Care

## 2020-08-19 DIAGNOSIS — E785 Hyperlipidemia, unspecified: Secondary | ICD-10-CM

## 2020-08-20 ENCOUNTER — Other Ambulatory Visit: Payer: Self-pay

## 2020-08-20 ENCOUNTER — Ambulatory Visit (INDEPENDENT_AMBULATORY_CARE_PROVIDER_SITE_OTHER): Payer: Medicare Other | Admitting: Cardiovascular Disease

## 2020-08-20 ENCOUNTER — Encounter: Payer: Self-pay | Admitting: Cardiovascular Disease

## 2020-08-20 VITALS — BP 130/84 | HR 63 | Ht 59.0 in | Wt 160.0 lb

## 2020-08-20 DIAGNOSIS — I700 Atherosclerosis of aorta: Secondary | ICD-10-CM | POA: Diagnosis not present

## 2020-08-20 DIAGNOSIS — R5382 Chronic fatigue, unspecified: Secondary | ICD-10-CM | POA: Diagnosis not present

## 2020-08-20 DIAGNOSIS — K449 Diaphragmatic hernia without obstruction or gangrene: Secondary | ICD-10-CM

## 2020-08-20 DIAGNOSIS — I2510 Atherosclerotic heart disease of native coronary artery without angina pectoris: Secondary | ICD-10-CM | POA: Diagnosis not present

## 2020-08-20 DIAGNOSIS — E782 Mixed hyperlipidemia: Secondary | ICD-10-CM | POA: Diagnosis not present

## 2020-08-20 MED ORDER — ROSUVASTATIN CALCIUM 20 MG PO TABS: 20.0000 mg | ORAL_TABLET | Freq: Every day | ORAL | 3 refills | Status: DC

## 2020-08-20 NOTE — Progress Notes (Signed)
Cardiology Office Note  Date:  08/20/2020   ID:  Grace Ortiz, DOB 1946/08/12, MRN [id number]  PCP:  Pleas Koch, NP   Chief Complaint  Patient presents with  . New Patient (Initial Visit)    Referred by Dr. Carlis Abbott to establish cardiac care; Meds verbally reviewed with patient.    HPI:  [name] is a 74 year old woman with past medical history of Chronic fatigue Hyperlipidemia Moderate-sized hiatal hernia Right-sided aortic arch Aortic atherosclerosis, coronary calcification on CT Obesity Who presents by referral from [place] [facility] for evaluation of her coronary atherosclerosis on CT scan  Denies any anginal symptoms, Active, no regular exercise program  CT scan images pulled up and reviewed,  right-sided aortic arch noted Minimal aortic atherosclerosis with 2 speckles noted Mild to moderate proximal RCA calcification noted  Denies smoking history, no diabetes Some indigestion at times which she attributes to her hiatal hernia, images shown to her  Weight has been trending up over the past several years Weight in September 2018 was 156 Weight on today's visit 160 pounds  On low-dose simvastatin 10 mg daily Lab Results  Component Value Date   CHOL 173 11/02/2019   HDL 64.00 11/02/2019   LDLCALC 91 11/02/2019   TRIG 90.0 11/02/2019   EKG personally reviewed by myself on todays visit Shows normal sinus rhythm rate 63 bpm no significant ST or T wave changes  PMH:   has a past medical history of Acute UTI (08/02/2018), Arthritis, Bell's palsy, Cervical polyp, Diverticulitis, Frequent headaches, GERD (gastroesophageal reflux disease), Hyperlipidemia, Hypothyroidism, Macular retinal puckering, bilateral, and Migraines.  PSH:    Past Surgical History:  Procedure Laterality Date  . BLEPHAROPLASTY  2008, 2009  . BREAST EXCISIONAL BIOPSY Left 05/1993   neg  . BREAST EXCISIONAL BIOPSY Left 09/1993   neg  . CATARACT EXTRACTION  2008  . CERVICAL  POLYPECTOMY  2014  . COLONOSCOPY    . COLONOSCOPY WITH PROPOFOL N/A 12/25/2019   Procedure: COLONOSCOPY WITH PROPOFOL;  Surgeon: Jonathon Bellows, MD;  Location: [facility] ENDOSCOPY;  Service: Gastroenterology;  Laterality: N/A;  . DILATION AND CURETTAGE OF UTERUS    . HYSTEROSCOPY  2014  . UPPER ENDOSCOPY W/ ANTRODUODENAL MANOMETRY      Current Outpatient Medications  Medication Sig Dispense Refill  . Ascorbic Acid (VITAMIN C PO) Take 1,000 Units by mouth daily.    Marland Kitchen aspirin EC 81 MG tablet Take 81 mg by mouth daily.    Marland Kitchen buPROPion (WELLBUTRIN XL) 300 MG 24 hr tablet TAKE 1 TABLET (300 MG TOTAL) BY MOUTH DAILY. FOR ANXIETY/DEPRESSION. 90 tablet 3  . Cholecalciferol (VITAMIN D3 PO) Take 1,200 mg by mouth daily.    . citalopram (CELEXA) 40 MG tablet TAKE 1 TABLET (40 MG TOTAL) BY MOUTH DAILY. FOR ANXIETY/DEPRESSION. 90 tablet 3  . Cyanocobalamin (VITAMIN B12 PO) Take 2,500 mcg by mouth daily.    Marland Kitchen levothyroxine (SYNTHROID) 50 MCG tablet TAKE 1 TABLET BY on Sunday ON EMPTY STOMACH WITH WATER, NO FOOD OR OTHER MEDS FOR 30 MIN 12 tablet 1  . levothyroxine (SYNTHROID) 75 MCG tablet TAKE 1 TABLET BY MOUTH ON Monday thru [place], NO FOOD OR OTHER MEDS FOR 30 MIN 90 tablet 1  . losartan (COZAAR) 25 MG tablet TAKE 1 TABLET (25 MG TOTAL) BY MOUTH DAILY. FOR KIDNEY PROTECTION. 30 tablet 3  . Omega-3 Fatty Acids (FISH OIL PO) Take 360 mg by mouth daily.    Marland Kitchen omeprazole (PRILOSEC) 20 MG  capsule Take 1 capsule (20 mg total) by mouth daily. For heartburn. 90 capsule 0  . polyethylene glycol (MIRALAX / GLYCOLAX) packet Take 17 g by mouth daily.     . prochlorperazine (COMPAZINE) 10 MG tablet Take 1 tablet (10 mg total) by mouth every 6 (six) hours as needed for nausea or vomiting. 30 tablet 0  . traZODone (DESYREL) 50 MG tablet Take 1 tablet (50 mg total) by mouth at bedtime as needed for sleep. 90 tablet 3  . rosuvastatin (CRESTOR) 20 MG tablet Take 1 tablet (20 mg total) by mouth daily.  90 tablet 3   No current facility-administered medications for this visit.     Allergies:   Patient has no known allergies.   Social History:  The patient  reports that she quit smoking about 49 years ago. Her smoking use included cigarettes. She has a 6.00 pack-year smoking history. She has never used smokeless tobacco. She reports current alcohol use. She reports that she does not use drugs.   Family History:   family history includes Breast cancer (age of onset: 54) in her maternal grandmother; Cancer in her father and maternal grandmother; Diabetes in her sister; Early death in her maternal grandmother and paternal grandfather; Hearing loss in her father and mother; Heart disease in her mother.    Review of Systems: Review of Systems  Constitutional: Negative.   HENT: Negative.   Respiratory: Negative.   Cardiovascular: Negative.   Gastrointestinal: Negative.   Musculoskeletal: Negative.   Neurological: Negative.   Psychiatric/Behavioral: Negative.   All other systems reviewed and are negative.   PHYSICAL EXAM: VS:  BP 130/84 (BP Location: Left Arm, Patient Position: Sitting, Cuff Size: Normal)   Pulse 63   Ht 4\' 11"  (1.499 m)   Wt 160 lb (72.6 kg)   SpO2 96%   BMI 32.32 kg/m  , BMI Body mass index is 32.32 kg/m. GEN: Well nourished, well developed, in no acute distress HEENT: normal Neck: no JVD, carotid bruits, or masses Cardiac: RRR; no murmurs, rubs, or gallops,no edema  Respiratory:  clear to auscultation bilaterally, normal work of breathing GI: soft, nontender, nondistended, + BS MS: no deformity or atrophy Skin: warm and dry, no rash Neuro:  Strength and sensation are intact Psych: euthymic mood, full affect  Recent Labs: 11/02/2019: ALT 18; Hemoglobin 14.4; Platelets 207.0 06/25/2020: BUN 17; Creatinine, Ser 1.08; Potassium 4.4; Sodium 141; TSH 2.43    Lipid Panel Lab Results  Component Value Date   CHOL 173 11/02/2019   HDL 64.00 11/02/2019   LDLCALC  91 11/02/2019   TRIG 90.0 11/02/2019      Wt Readings from Last 3 Encounters:  08/20/20 160 lb (72.6 kg)  06/21/20 159 lb (72.1 kg)  05/14/20 161 lb 4 oz (73.1 kg)     ASSESSMENT AND PLAN:  Problem List Items Addressed This Visit      Cardiology Problems   Hyperlipidemia   Relevant Medications   rosuvastatin (CRESTOR) 20 MG tablet    Other Visit Diagnoses    Aortic atherosclerosis ([place])    -  Primary   Relevant Medications   rosuvastatin (CRESTOR) 20 MG tablet   Other Relevant Orders   EKG 12-Lead   Coronary artery calcification seen on CAT scan       Relevant Medications   rosuvastatin (CRESTOR) 20 MG tablet   Other Relevant Orders   EKG 12-Lead   Chronic fatigue       Hiatal hernia  Aortic atherosclerosis Minimal disease noted, 2 speckles on review of images Plan as below  Coronary calcification Mild to moderate in the proximal RCA, no anginal symptoms Recommend more aggressive lipid panel, goal LDL less than 70 Recommend she stop simvastatin when she runs out change to Crestor 10 mg for the first several weeks then up to 20 mg -Discussed anginal symptoms to watch for  Hyperlipidemia Plan as above, change to Crestor 20 daily with slow titration up Repeat lipid panel through primary care 2 to 3 months  Obesity Lifestyle modification, walking program Weight is up 30 pounds in the past 3 years 156 pounds in 2018,   Today 160 pounds, 4 feet 11 inches, elevated BMI  Hiatal hernia Moderate in size, some indigestion Recommend she moderate her meal size, on omeprazole   Total encounter time more than 60 minutes  Greater than 50% was spent in counseling and coordination of care with the patient    Signed, Esmond Plants, M.D., Ph.D. [place], [place]

## 2020-08-20 NOTE — Patient Instructions (Signed)
Medication Instructions:  Hold the simvastatin when you run out Change to crestor 20 mg daily (take 1/2 pill for a few weeks then increase up to a full pill)  If you need a refill on your cardiac medications before your next appointment, please call your pharmacy.    Lab work: No new labs needed   If you have labs (blood work) drawn today and your tests are completely normal, you will receive your results only by: Marland Kitchen MyChart Message (if you have MyChart) OR . A paper copy in the mail If you have any lab test that is abnormal or we need to change your treatment, we will call you to review the results.   Testing/Procedures: No new testing needed   Follow-Up: At [facility], you and your health needs are our priority.  As part of our continuing mission to provide you with exceptional heart care, we have created designated Provider Care Teams.  These Care Teams include your primary Cardiologist (physician) and Advanced Practice Providers (APPs -  Physician Assistants and Nurse Practitioners) who all work together to provide you with the care you need, when you need it.  . You will need a follow up appointment as needed  . Providers on your designated Care Team:   . Murray Hodgkins, NP . Christell Faith, PA-C . Marrianne Mood, PA-C  Any Other Special Instructions Will Be Listed Below (If Applicable).  COVID-19 Vaccine Information can be found at: [email or web address].uk For questions related to vaccine distribution or appointments, please email [email or web address] or call [phone].

## 2020-08-22 ENCOUNTER — Ambulatory Visit: Payer: Medicare Other | Admitting: Primary Care

## 2020-08-22 ENCOUNTER — Other Ambulatory Visit: Payer: Medicare Other

## 2020-08-29 DIAGNOSIS — Z23 Encounter for immunization: Secondary | ICD-10-CM | POA: Diagnosis not present

## 2020-08-30 ENCOUNTER — Other Ambulatory Visit: Payer: Self-pay | Admitting: Primary Care

## 2020-08-30 DIAGNOSIS — K219 Gastro-esophageal reflux disease without esophagitis: Secondary | ICD-10-CM

## 2020-08-30 DIAGNOSIS — N183 Chronic kidney disease, stage 3 unspecified: Secondary | ICD-10-CM

## 2020-09-07 ENCOUNTER — Other Ambulatory Visit: Payer: Self-pay | Admitting: Primary Care

## 2020-09-07 DIAGNOSIS — K219 Gastro-esophageal reflux disease without esophagitis: Secondary | ICD-10-CM

## 2020-10-22 DIAGNOSIS — H903 Sensorineural hearing loss, bilateral: Secondary | ICD-10-CM | POA: Diagnosis not present

## 2020-10-22 DIAGNOSIS — T162XXA Foreign body in left ear, initial encounter: Secondary | ICD-10-CM | POA: Diagnosis not present

## 2020-11-25 DIAGNOSIS — H40003 Preglaucoma, unspecified, bilateral: Secondary | ICD-10-CM | POA: Diagnosis not present

## 2020-11-25 DIAGNOSIS — H16223 Keratoconjunctivitis sicca, not specified as Sjogren's, bilateral: Secondary | ICD-10-CM | POA: Diagnosis not present

## 2020-11-27 ENCOUNTER — Other Ambulatory Visit: Payer: Self-pay

## 2020-11-27 ENCOUNTER — Ambulatory Visit (INDEPENDENT_AMBULATORY_CARE_PROVIDER_SITE_OTHER): Payer: Medicare Other

## 2020-11-27 DIAGNOSIS — Z0000 Encounter for general adult medical examination without abnormal findings: Secondary | ICD-10-CM | POA: Diagnosis not present

## 2020-11-27 NOTE — Progress Notes (Signed)
Subjective:   Grace Ortiz is a 75 y.o. female who presents for Medicare Annual (Subsequent) preventive examination.  Review of Systems: N/A      I connected with the patient today by telephone and verified that I am speaking with the correct person using two identifiers. Location patient: home Location nurse: work Persons participating in the telephone visit: patient, nurse.   I discussed the limitations, risks, security and privacy concerns of performing an evaluation and management service by telephone and the availability of in person appointments. I also discussed with the patient that there may be a patient responsible charge related to this service. The patient expressed understanding and verbally consented to this telephonic visit.        Cardiac Risk Factors include: advanced age (>68men, >35 women);Other (see comment), Risk factor comments: hyperlipidemia     Objective:    Today's Vitals   There is no height or weight on file to calculate BMI.  Advanced Directives 11/27/2020 12/25/2019 11/02/2019 10/31/2018 08/02/2018 08/02/2018 10/28/2017  Does Patient Have a Medical Advance Directive? Yes Yes Yes Yes No No Yes  Type of Paramedic of [place];Living will [place];Living will [place];Living will [place];Living will - - [place];Living will  Copy of [place] in Chart? No - copy requested No - copy requested No - copy requested No - copy requested - - No - copy requested  Would patient like information on creating a medical advance directive? - - - - No - Patient declined No - Patient declined -    Current Medications (verified) Outpatient Encounter Medications as of 11/27/2020  Medication Sig  . Ascorbic Acid (VITAMIN C PO) Take 1,000 Units by mouth daily.  Marland Kitchen aspirin EC 81 MG tablet Take 81 mg by mouth daily.  Marland Kitchen buPROPion (WELLBUTRIN XL) 300 MG 24 hr  tablet TAKE 1 TABLET (300 MG TOTAL) BY MOUTH DAILY. FOR ANXIETY/DEPRESSION.  Marland Kitchen Cholecalciferol (VITAMIN D3 PO) Take 1,200 mg by mouth daily.  . citalopram (CELEXA) 40 MG tablet TAKE 1 TABLET (40 MG TOTAL) BY MOUTH DAILY. FOR ANXIETY/DEPRESSION.  Marland Kitchen Cyanocobalamin (VITAMIN B12 PO) Take 2,500 mcg by mouth daily.  Marland Kitchen levothyroxine (SYNTHROID) 50 MCG tablet TAKE 1 TABLET BY on Sunday ON EMPTY STOMACH WITH WATER, NO FOOD OR OTHER MEDS FOR 30 MIN  . levothyroxine (SYNTHROID) 75 MCG tablet TAKE 1 TABLET BY MOUTH ON MONDAY THRU SATURDAY ON EMPTY STOMACH WITH WATER, NO FOOD OR OTHER MEDS FOR 30 MIN  . losartan (COZAAR) 25 MG tablet TAKE 1 TABLET (25 MG TOTAL) BY MOUTH DAILY. FOR KIDNEY PROTECTION.  Marland Kitchen Omega-3 Fatty Acids (FISH OIL PO) Take 360 mg by mouth daily.  Marland Kitchen omeprazole (PRILOSEC) 20 MG capsule TAKE 1 CAPSULE (20 MG TOTAL) BY MOUTH DAILY. FOR HEARTBURN.  Marland Kitchen polyethylene glycol (MIRALAX / GLYCOLAX) packet Take 17 g by mouth daily.   . prochlorperazine (COMPAZINE) 10 MG tablet Take 1 tablet (10 mg total) by mouth every 6 (six) hours as needed for nausea or vomiting.  . rosuvastatin (CRESTOR) 20 MG tablet Take 1 tablet (20 mg total) by mouth daily.  . traZODone (DESYREL) 50 MG tablet Take 1 tablet (50 mg total) by mouth at bedtime as needed for sleep.   No facility-administered encounter medications on file as of 11/27/2020.    Allergies (verified) Patient has no known allergies.   History: Past Medical History:  Diagnosis Date  . Acute UTI 08/02/2018  .  Arthritis   . Bell's palsy   . Cervical polyp   . Diverticulitis   . Frequent headaches   . GERD (gastroesophageal reflux disease)   . Hyperlipidemia   . Hypothyroidism   . Macular retinal puckering, bilateral   . Migraines    Past Surgical History:  Procedure Laterality Date  . BLEPHAROPLASTY  2008, 2009  . BREAST EXCISIONAL BIOPSY Left 05/1993   neg  . BREAST EXCISIONAL BIOPSY Left 09/1993   neg  . CATARACT EXTRACTION  2008  .  CERVICAL POLYPECTOMY  2014  . COLONOSCOPY    . COLONOSCOPY WITH PROPOFOL N/A 12/25/2019   Procedure: COLONOSCOPY WITH PROPOFOL;  Surgeon: Jonathon Bellows, MD;  Location: [facility] ENDOSCOPY;  Service: Gastroenterology;  Laterality: N/A;  . DILATION AND CURETTAGE OF UTERUS    . HYSTEROSCOPY  2014  . UPPER ENDOSCOPY W/ ANTRODUODENAL MANOMETRY     Family History  Problem Relation Age of Onset  . Breast cancer Maternal Grandmother 30  . Cancer Maternal Grandmother        breast  . Early death Maternal Grandmother   . Hearing loss Mother   . Heart disease Mother   . Cancer Father        colon  . Hearing loss Father   . Diabetes Sister   . Early death Paternal Grandfather    Social History   Socioeconomic History  . Marital status: Married    Spouse name: Not on file  . Number of children: Not on file  . Years of education: Not on file  . Highest education level: Not on file  Occupational History  . Not on file  Tobacco Use  . Smoking status: Former Smoker    Packs/day: 1.00    Years: 6.00    Pack years: 6.00    Types: Cigarettes    Quit date: 10/19/1970    Years since quitting: 50.1  . Smokeless tobacco: Never Used  Vaping Use  . Vaping Use: Never used  Substance and Sexual Activity  . Alcohol use: Yes    Comment: rarely  . Drug use: No  . Sexual activity: Not on file  Other Topics Concern  . Not on file  Social History Narrative   Married.   No children.   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    Moved from [place].   Social Determinants of Health   Financial Resource Strain: Low Risk   . Difficulty of Paying Living Expenses: Not hard at all  Food Insecurity: No Food Insecurity  . Worried About Charity fundraiser in the Last Year: Never true  . Ran Out of Food in the Last Year: Never true  Transportation Needs: No Transportation Needs  . Lack of Transportation (Medical): No  . Lack of Transportation (Non-Medical): No  Physical Activity: Inactive  . Days  of Exercise per Week: 0 days  . Minutes of Exercise per Session: 0 min  Stress: No Stress Concern Present  . Feeling of Stress : Not at all  Social Connections: Not on file    Tobacco Counseling Counseling given: Not Answered   Clinical Intake:  Pre-visit preparation completed: Yes  Pain : No/denies pain     Nutritional Risks: None Diabetes: No  How often do you need to have someone help you when you read instructions, pamphlets, or other written materials from your doctor or pharmacy?: 1 - Never What is the last grade level you completed in school?: some college  Diabetic: No Nutrition  Risk Assessment:  Has the patient had any N/V/D within the last 2 months?  No  Does the patient have any non-healing wounds?  No  Has the patient had any unintentional weight loss or weight gain?  No   Diabetes:  Is the patient diabetic?  No  If diabetic, was a CBG obtained today?  N/A Did the patient bring in their glucometer from home?  N/A How often do you monitor your CBG's? N/A.   Financial Strains and Diabetes Management:  Are you having any financial strains with the device, your supplies or your medication? N/A.  Does the patient want to be seen by Chronic Care Management for management of their diabetes?  N/A Would the patient like to be referred to a Nutritionist or for Diabetic Management?  N/A  Interpreter Needed?: No  Information entered by :: CJohnson, LPN   Activities of Daily Living In your present state of health, do you have any difficulty performing the following activities: 11/27/2020  Hearing? Y  Comment wears hearing aids  Vision? N  Difficulty concentrating or making decisions? N  Walking or climbing stairs? N  Dressing or bathing? N  Doing errands, shopping? N  Preparing Food and eating ? N  Using the Toilet? N  In the past six months, have you accidently leaked urine? Y  Comment some leakage with stress  Do you have problems with loss of bowel  control? N  Managing your Medications? N  Managing your Finances? N  Housekeeping or managing your Housekeeping? N  Some recent data might be hidden    Patient Care Team: Pleas Koch, NP as PCP - General (Internal Medicine) Leandrew Koyanagi, MD as Referring Physician (Ophthalmology)  Indicate any recent Medical Services you may have received from other than [facility] providers in the past year (date may be approximate).     Assessment:   This is a routine wellness examination for [place].  Hearing/Vision screen  Hearing Screening   125Hz  250Hz  500Hz  1000Hz  2000Hz  3000Hz  4000Hz  6000Hz  8000Hz   Right ear:           Left ear:           Vision Screening Comments: Patient gets annual eye exams   Dietary issues and exercise activities discussed: Current Exercise Habits: The patient does not participate in regular exercise at present, Exercise limited by: None identified  Goals    . Patient Stated     Starting 10/31/2018, I will continue to take medications as prescribed.     . Patient Stated     11/02/2019, I will maintain and continue medications as prescribed.     . Patient Stated     11/27/2020, I will maintain and continue medications as prescribed.       Depression Screen PHQ 2/9 Scores 11/27/2020 11/02/2019 10/31/2018 10/31/2018 10/28/2017  PHQ - 2 Score 2 1 0 2 0  PHQ- 9 Score 2 1 0 5 3    Fall Risk Fall Risk  11/27/2020 11/02/2019 10/31/2018 10/31/2018 10/28/2017  Falls in the past year? 0 0 1 1 No  Comment - - 2 falls caused by tripping and losing balance; brusing  - -  Number falls in past yr: 0 0 1 1 -  Injury with Fall? 0 0 1 1 -  Risk for fall due to : No Fall Risks Medication side effect - Impaired balance/gait;Impaired mobility -  Follow up Falls evaluation completed;Falls prevention discussed Falls evaluation completed;Falls prevention discussed - - -  FALL RISK PREVENTION PERTAINING TO THE HOME:  Any stairs in or around the home? Yes  If so, are there any  without handrails? No  Home free of loose throw rugs in walkways, pet beds, electrical cords, etc? Yes  Adequate lighting in your home to reduce risk of falls? Yes   ASSISTIVE DEVICES UTILIZED TO PREVENT FALLS:  Life alert? No  Use of a cane, walker or w/c? No  Grab bars in the bathroom? No  Shower chair or bench in shower? No  Elevated toilet seat or a handicapped toilet? No   TIMED UP AND GO:  Was the test performed? N/A telephone visit.   Cognitive Function: MMSE - Mini Mental State Exam 11/27/2020 11/02/2019 10/31/2018 10/28/2017  Orientation to time 5 5 5 5   Orientation to Place 5 5 5 5   Registration 3 3 3 3   Attention/ Calculation 5 5 0 0  Recall 3 3 3 3   Language- name 2 objects - - 0 0  Language- repeat 1 1 1 1   Language- follow 3 step command - - 3 3  Language- read & follow direction - - 0 0  Write a sentence - - 0 0  Copy design - - 0 0  Total score - - 20 20  Mini Cog  Mini-Cog screen was completed. Maximum score is 22. A value of 0 denotes this part of the MMSE was not completed or the patient failed this part of the Mini-Cog screening.       Immunizations Immunization History  Administered Date(s) Administered  . Influenza Split 08/06/2009  . Influenza, High Dose Seasonal PF 08/31/2012, 07/26/2013, 08/01/2014, 07/31/2015, 08/05/2016, 07/29/2018, 06/09/2019, 06/25/2020  . Influenza,inj,Quad PF,6+ Mos 06/30/2017  . Influenza-Unspecified 07/29/2018, 06/09/2019  . PFIZER(Purple Top)SARS-COV-2 Vaccination 01/15/2020, 02/07/2020, 08/29/2020  . Pneumococcal Conjugate-13 06/30/2012, 10/25/2013  . Pneumococcal Polysaccharide-23 09/07/2012, 07/27/2016  . Tdap 10/29/2015, 06/01/2016  . Zoster 02/11/2011  . Zoster Recombinat (Shingrix) 11/10/2019, 01/08/2020    TDAP status: Up to date  Flu Vaccine status: Up to date  Pneumococcal vaccine status: Up to date  Covid-19 vaccine status: Completed vaccines  Qualifies for Shingles Vaccine? Yes   Zostavax completed  Yes   Shingrix Completed?: Yes  Screening Tests Health Maintenance  Topic Date Due  . MAMMOGRAM  07/03/2021  . COLONOSCOPY (Pts 45-87yrs Insurance coverage will need to be confirmed)  12/25/2022  . TETANUS/TDAP  06/01/2026  . INFLUENZA VACCINE  Completed  . DEXA SCAN  Completed  . COVID-19 Vaccine  Completed  . Hepatitis C Screening  Completed  . PNA vac Low Risk Adult  Completed    Health Maintenance  There are no preventive care reminders to display for this patient.  Colorectal cancer screening: Type of screening: Colonoscopy. Completed 12/25/2019. Repeat every 3 years  Mammogram status: Completed 07/03/2020. Repeat every year  Bone Density status: due, will discuss with provider at physical   Lung Cancer Screening: (Low Dose CT Chest recommended if Age 67-80 years, 30 pack-year currently smoking OR have quit w/in 15 years.) does not qualify.    Additional Screening:  Hepatitis C Screening: does qualify; Completed 10/20/2015  Vision Screening: Recommended annual ophthalmology exams for early detection of glaucoma and other disorders of the eye. Is the patient up to date with their annual eye exam?  Yes  Who is the provider or what is the name of the office in which the patient attends annual eye exams? [facility] If pt is not established with a provider, would  they like to be referred to a provider to establish care? No .   Dental Screening: Recommended annual dental exams for proper oral hygiene  Community Resource Referral / Chronic Care Management: CRR required this visit?  No   CCM required this visit?  No      Plan:     I have personally reviewed and noted the following in the patient's chart:   . Medical and social history . Use of alcohol, tobacco or illicit drugs  . Current medications and supplements . Functional ability and status . Nutritional status . Physical activity . Advanced directives . List of other physicians . Hospitalizations,  surgeries, and ER visits in previous 12 months . Vitals . Screenings to include cognitive, depression, and falls . Referrals and appointments  In addition, I have reviewed and discussed with patient certain preventive protocols, quality metrics, and best practice recommendations. A written personalized care plan for preventive services as well as general preventive health recommendations were provided to patient.   Due to this being a telephonic visit, the after visit summary with patients personalized plan was offered to patient via office or my-chart. Patient preferred to pick up at office at next visit or via mychart.   Andrez Grime, LPN   01/26/[id number]

## 2020-11-27 NOTE — Progress Notes (Signed)
PCP notes:  Health Maintenance: Dexa- due   Abnormal Screenings: none   Patient concerns: none   Nurse concerns: none   Next PCP appt.: 12/11/2020 @ 12:20 pm

## 2020-11-27 NOTE — Patient Instructions (Signed)
Grace Ortiz , Thank you for taking time to come for your Medicare Wellness Visit. I appreciate your ongoing commitment to your health goals. Please review the following plan we discussed and let me know if I can assist you in the future.   Screening recommendations/referrals: Colonoscopy: Up to date, completed 12/25/2019, due 12/2022 Mammogram: Up to date, completed 07/03/2020, due 06/2021 Bone Density:  due, will discuss with provider at physical  Recommended yearly ophthalmology/optometry visit for glaucoma screening and checkup Recommended yearly dental visit for hygiene and checkup  Vaccinations: Influenza vaccine: Up to date, completed 06/25/2020, due 05/2021 Pneumococcal vaccine: Completed series Tdap vaccine: Up to date, completed 06/01/2016, due 05/2026 Shingles vaccine: Completed series   Covid-19:Completed series  Advanced directives: Please bring a copy of your POA (Power of [place]) and/or Living Will to your next appointment.   Conditions/risks identified: hyperlipidemia  Next appointment: Follow up in one year for your annual wellness visit    Preventive Care 65 Years and Older, Female Preventive care refers to lifestyle choices and visits with your health care provider that can promote health and wellness. What does preventive care include?  A yearly physical exam. This is also called an annual well check.  Dental exams once or twice a year.  Routine eye exams. Ask your health care provider how often you should have your eyes checked.  Personal lifestyle choices, including:  Daily care of your teeth and gums.  Regular physical activity.  Eating a healthy diet.  Avoiding tobacco and drug use.  Limiting alcohol use.  Practicing safe sex.  Taking low-dose aspirin every day.  Taking vitamin and mineral supplements as recommended by your health care provider. What happens during an annual well check? The services and screenings done by your health care provider  during your annual well check will depend on your age, overall health, lifestyle risk factors, and family history of disease. Counseling  Your health care provider may ask you questions about your:  Alcohol use.  Tobacco use.  Drug use.  Emotional well-being.  Home and relationship well-being.  Sexual activity.  Eating habits.  History of falls.  Memory and ability to understand (cognition).  Work and work Statistician.  Reproductive health. Screening  You may have the following tests or measurements:  Height, weight, and BMI.  Blood pressure.  Lipid and cholesterol levels. These may be checked every 5 years, or more frequently if you are over 63 years old.  Skin check.  Lung cancer screening. You may have this screening every year starting at age 34 if you have a 30-pack-year history of smoking and currently smoke or have quit within the past 15 years.  Fecal occult blood test (FOBT) of the stool. You may have this test every year starting at age 75.  Flexible sigmoidoscopy or colonoscopy. You may have a sigmoidoscopy every 5 years or a colonoscopy every 10 years starting at age 26.  Hepatitis C blood test.  Hepatitis B blood test.  Sexually transmitted disease (STD) testing.  Diabetes screening. This is done by checking your blood sugar (glucose) after you have not eaten for a while (fasting). You may have this done every 1-3 years.  Bone density scan. This is done to screen for osteoporosis. You may have this done starting at age 45.  Mammogram. This may be done every 1-2 years. Talk to your health care provider about how often you should have regular mammograms. Talk with your health care provider about your test results, treatment options,  and if necessary, the need for more tests. Vaccines  Your health care provider may recommend certain vaccines, such as:  Influenza vaccine. This is recommended every year.  Tetanus, diphtheria, and acellular pertussis  (Tdap, Td) vaccine. You may need a Td booster every 10 years.  Zoster vaccine. You may need this after age 51.  Pneumococcal 13-valent conjugate (PCV13) vaccine. One dose is recommended after age 3.  Pneumococcal polysaccharide (PPSV23) vaccine. One dose is recommended after age 39. Talk to your health care provider about which screenings and vaccines you need and how often you need them. This information is not intended to replace advice given to you by your health care provider. Make sure you discuss any questions you have with your health care provider. Document Released: 11/01/2015 Document Revised: 06/24/2016 Document Reviewed: 08/06/2015 Elsevier Interactive Patient Education  2017 [place] Prevention in the Home Falls can cause injuries. They can happen to people of all ages. There are many things you can do to make your home safe and to help prevent falls. What can I do on the outside of my home?  Regularly fix the edges of walkways and driveways and fix any cracks.  Remove anything that might make you trip as you walk through a door, such as a raised step or threshold.  Trim any bushes or trees on the path to your home.  Use bright outdoor lighting.  Clear any walking paths of anything that might make someone trip, such as rocks or tools.  Regularly check to see if handrails are loose or broken. Make sure that both sides of any steps have handrails.  Any raised decks and porches should have guardrails on the edges.  Have any leaves, snow, or ice cleared regularly.  Use sand or salt on walking paths during winter.  Clean up any spills in your garage right away. This includes oil or grease spills. What can I do in the bathroom?  Use night lights.  Install grab bars by the toilet and in the tub and shower. Do not use towel bars as grab bars.  Use non-skid mats or decals in the tub or shower.  If you need to sit down in the shower, use a plastic, non-slip  stool.  Keep the floor dry. Clean up any water that spills on the floor as soon as it happens.  Remove soap buildup in the tub or shower regularly.  Attach bath mats securely with double-sided non-slip rug tape.  Do not have throw rugs and other things on the floor that can make you trip. What can I do in the bedroom?  Use night lights.  Make sure that you have a light by your bed that is easy to reach.  Do not use any sheets or blankets that are too big for your bed. They should not hang down onto the floor.  Have a firm chair that has side arms. You can use this for support while you get dressed.  Do not have throw rugs and other things on the floor that can make you trip. What can I do in the kitchen?  Clean up any spills right away.  Avoid walking on wet floors.  Keep items that you use a lot in easy-to-reach places.  If you need to reach something above you, use a strong step stool that has a grab bar.  Keep electrical cords out of the way.  Do not use floor polish or wax that makes floors slippery. If  you must use wax, use non-skid floor wax.  Do not have throw rugs and other things on the floor that can make you trip. What can I do with my stairs?  Do not leave any items on the stairs.  Make sure that there are handrails on both sides of the stairs and use them. Fix handrails that are broken or loose. Make sure that handrails are as long as the stairways.  Check any carpeting to make sure that it is firmly attached to the stairs. Fix any carpet that is loose or worn.  Avoid having throw rugs at the top or bottom of the stairs. If you do have throw rugs, attach them to the floor with carpet tape.  Make sure that you have a light switch at the top of the stairs and the bottom of the stairs. If you do not have them, ask someone to add them for you. What else can I do to help prevent falls?  Wear shoes that:  Do not have high heels.  Have rubber bottoms.  Are  comfortable and fit you well.  Are closed at the toe. Do not wear sandals.  If you use a stepladder:  Make sure that it is fully opened. Do not climb a closed stepladder.  Make sure that both sides of the stepladder are locked into place.  Ask someone to hold it for you, if possible.  Clearly mark and make sure that you can see:  Any grab bars or handrails.  First and last steps.  Where the edge of each step is.  Use tools that help you move around (mobility aids) if they are needed. These include:  Canes.  Walkers.  Scooters.  Crutches.  Turn on the lights when you go into a dark area. Replace any light bulbs as soon as they burn out.  Set up your furniture so you have a clear path. Avoid moving your furniture around.  If any of your floors are uneven, fix them.  If there are any pets around you, be aware of where they are.  Review your medicines with your doctor. Some medicines can make you feel dizzy. This can increase your chance of falling. Ask your doctor what other things that you can do to help prevent falls. This information is not intended to replace advice given to you by your health care provider. Make sure you discuss any questions you have with your health care provider. Document Released: 08/01/2009 Document Revised: 03/12/2016 Document Reviewed: 11/09/2014 Elsevier Interactive Patient Education  2017 [employer].

## 2020-11-28 ENCOUNTER — Other Ambulatory Visit: Payer: Self-pay | Admitting: Primary Care

## 2020-11-28 DIAGNOSIS — K219 Gastro-esophageal reflux disease without esophagitis: Secondary | ICD-10-CM

## 2020-11-29 ENCOUNTER — Ambulatory Visit: Payer: Medicare Other | Admitting: Primary Care

## 2020-12-11 ENCOUNTER — Ambulatory Visit (INDEPENDENT_AMBULATORY_CARE_PROVIDER_SITE_OTHER): Payer: Medicare Other | Admitting: Primary Care

## 2020-12-11 ENCOUNTER — Other Ambulatory Visit: Payer: Self-pay

## 2020-12-11 ENCOUNTER — Encounter: Payer: Self-pay | Admitting: Primary Care

## 2020-12-11 VITALS — BP 132/70 | HR 66 | Temp 98.7°F | Ht 59.0 in | Wt 162.0 lb

## 2020-12-11 DIAGNOSIS — R059 Cough, unspecified: Secondary | ICD-10-CM | POA: Diagnosis not present

## 2020-12-11 DIAGNOSIS — G4700 Insomnia, unspecified: Secondary | ICD-10-CM | POA: Diagnosis not present

## 2020-12-11 DIAGNOSIS — E785 Hyperlipidemia, unspecified: Secondary | ICD-10-CM

## 2020-12-11 DIAGNOSIS — M5442 Lumbago with sciatica, left side: Secondary | ICD-10-CM | POA: Diagnosis not present

## 2020-12-11 DIAGNOSIS — E038 Other specified hypothyroidism: Secondary | ICD-10-CM

## 2020-12-11 DIAGNOSIS — R519 Headache, unspecified: Secondary | ICD-10-CM

## 2020-12-11 DIAGNOSIS — F419 Anxiety disorder, unspecified: Secondary | ICD-10-CM | POA: Diagnosis not present

## 2020-12-11 DIAGNOSIS — R911 Solitary pulmonary nodule: Secondary | ICD-10-CM | POA: Diagnosis not present

## 2020-12-11 DIAGNOSIS — Z0000 Encounter for general adult medical examination without abnormal findings: Secondary | ICD-10-CM

## 2020-12-11 DIAGNOSIS — F3342 Major depressive disorder, recurrent, in full remission: Secondary | ICD-10-CM

## 2020-12-11 DIAGNOSIS — K219 Gastro-esophageal reflux disease without esophagitis: Secondary | ICD-10-CM | POA: Diagnosis not present

## 2020-12-11 DIAGNOSIS — N183 Chronic kidney disease, stage 3 unspecified: Secondary | ICD-10-CM

## 2020-12-11 DIAGNOSIS — F32A Depression, unspecified: Secondary | ICD-10-CM

## 2020-12-11 DIAGNOSIS — G8929 Other chronic pain: Secondary | ICD-10-CM | POA: Insufficient documentation

## 2020-12-11 HISTORY — DX: Solitary pulmonary nodule: R91.1

## 2020-12-11 LAB — COMPREHENSIVE METABOLIC PANEL
ALT: 19 U/L (ref 0–35)
AST: 19 U/L (ref 0–37)
Albumin: 3.8 g/dL (ref 3.5–5.2)
Alkaline Phosphatase: 62 U/L (ref 39–117)
BUN: 20 mg/dL (ref 6–23)
CO2: 32 mEq/L (ref 19–32)
Calcium: 10.1 mg/dL (ref 8.4–10.5)
Chloride: 107 mEq/L (ref 96–112)
Creatinine, Ser: 1.11 mg/dL (ref 0.40–1.20)
GFR: 48.95 mL/min — ABNORMAL LOW (ref >60.00)
Glucose, Bld: 98 mg/dL (ref 70–99)
Potassium: 4.6 mEq/L (ref 3.5–5.1)
Sodium: 143 mEq/L (ref 135–145)
Total Bilirubin: 0.4 mg/dL (ref 0.2–1.2)
Total Protein: 6.3 g/dL (ref 6.0–8.3)

## 2020-12-11 LAB — LIPID PANEL
Cholesterol: 129 mg/dL (ref 0–200)
HDL: 64.9 mg/dL (ref >39.00)
LDL Cholesterol: 49 mg/dL (ref 0–99)
NonHDL: 63.81
Total CHOL/HDL Ratio: 2
Triglycerides: 73 mg/dL (ref 0.0–149.0)
VLDL: 14.6 mg/dL (ref 0.0–40.0)

## 2020-12-11 LAB — CBC
HCT: 41.5 % (ref 36.0–46.0)
Hemoglobin: 14 g/dL (ref 12.0–15.0)
MCHC: 33.8 g/dL (ref 30.0–36.0)
MCV: 96.3 fl (ref 78.0–100.0)
Platelets: 167 10*3/uL (ref 150.0–400.0)
RBC: 4.31 Mil/uL (ref 3.87–5.11)
RDW: 13.6 % (ref 11.5–15.5)
WBC: 5.7 10*3/uL (ref 4.0–10.5)

## 2020-12-11 LAB — TSH: TSH: 1.3 u[IU]/mL (ref 0.35–4.50)

## 2020-12-11 MED ORDER — CITALOPRAM HYDROBROMIDE 40 MG PO TABS: 40.0000 mg | ORAL_TABLET | Freq: Every day | ORAL | 3 refills | Status: DC

## 2020-12-11 MED ORDER — BUPROPION HCL ER (XL) 300 MG PO TB24: 300.0000 mg | ORAL_TABLET | Freq: Every day | ORAL | 3 refills | Status: DC

## 2020-12-11 MED ORDER — LOSARTAN POTASSIUM 25 MG PO TABS: 25.0000 mg | ORAL_TABLET | Freq: Every day | ORAL | 3 refills | Status: DC

## 2020-12-11 MED ORDER — PREDNISONE 20 MG PO TABS: ORAL_TABLET | ORAL | 0 refills | Status: DC

## 2020-12-11 MED ORDER — OMEPRAZOLE 20 MG PO CPDR: 20.0000 mg | DELAYED_RELEASE_CAPSULE | Freq: Every day | ORAL | 3 refills | Status: DC

## 2020-12-11 MED ORDER — TRAZODONE HCL 50 MG PO TABS: 50.0000 mg | ORAL_TABLET | Freq: Every evening | ORAL | 3 refills | Status: DC | PRN

## 2020-12-11 NOTE — Assessment & Plan Note (Signed)
Doing well on PRN trazodone 50 mg, continue same.

## 2020-12-11 NOTE — Addendum Note (Signed)
Addended by: Pleas Koch on: 12/11/2020 04:49 PM   Modules accepted: Orders

## 2020-12-11 NOTE — Progress Notes (Signed)
Subjective:    Patient ID: Grace Ortiz, female    DOB: 01/11/1946, 75 y.o.   MRN: [id number]  HPI  This visit occurred during the SARS-CoV-2 public health emergency.  Safety protocols were in place, including screening questions prior to the visit, additional usage of staff PPE, and extensive cleaning of exam room while observing appropriate contact time as indicated for disinfecting solutions.   Grace Ortiz is a 75 year old female who presents today for [place] [facility] and follow up of chronic health conditions.  She would also like to discuss acute on chronic left/midline lower back pain, intermittent for years, more recently noted after she began a filing project at home.  Symptoms began about 2 weeks ago, no improvement with ibuprofen and stretching.  She denies injury/trauma.  Immunizations: -Tetanus: 2017 -Influenza: Completed this season  -Shingles: Completed Shingrix and Zostavax -Pneumonia: Prevnar in 2015, 2013; Pneumovax 2017, 2013 -Covid-19: Completed three vaccines  Mammogram: September 2021 Dexa: 2019 Colonoscopy: 2021, due in 2024 Hep C Screen: Negative  BP Readings from Last 3 Encounters:  12/11/20 132/70  08/20/20 130/84  06/21/20 (!) 150/84      Review of Systems  Eyes: Negative for visual disturbance.  Respiratory: Negative for shortness of breath.   Cardiovascular: Negative for chest pain.  Gastrointestinal: Negative for diarrhea and nausea.  Musculoskeletal: Positive for arthralgias and back pain.  Neurological: Negative for headaches.  Psychiatric/Behavioral: The patient is not nervous/anxious.        Seasonal depression symptoms, overall feels well managed on her current regimen       Past Medical History:  Diagnosis Date  . Acute UTI 08/02/2018  . Arthritis   . Bell's palsy   . Cervical polyp   . Diverticulitis   . Frequent headaches   . GERD (gastroesophageal reflux disease)   . Hyperlipidemia   . Hypothyroidism   . Macular retinal  puckering, bilateral   . Migraines      Social History   Socioeconomic History  . Marital status: Married    Spouse name: Not on file  . Number of children: Not on file  . Years of education: Not on file  . Highest education level: Not on file  Occupational History  . Not on file  Tobacco Use  . Smoking status: Former Smoker    Packs/day: 1.00    Years: 6.00    Pack years: 6.00    Types: Cigarettes    Quit date: 10/19/1970    Years since quitting: 50.1  . Smokeless tobacco: Never Used  Vaping Use  . Vaping Use: Never used  Substance and Sexual Activity  . Alcohol use: Yes    Comment: rarely  . Drug use: No  . Sexual activity: Not on file  Other Topics Concern  . Not on file  Social History Narrative   Married.   No children.   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    Moved from [place].   Social Determinants of Health   Financial Resource Strain: Low Risk   . Difficulty of Paying Living Expenses: Not hard at all  Food Insecurity: No Food Insecurity  . Worried About Charity fundraiser in the Last Year: Never true  . Ran Out of Food in the Last Year: Never true  Transportation Needs: No Transportation Needs  . Lack of Transportation (Medical): No  . Lack of Transportation (Non-Medical): No  Physical Activity: Inactive  . Days of Exercise per Week: 0 days  .  Minutes of Exercise per Session: 0 min  Stress: No Stress Concern Present  . Feeling of Stress : Not at all  Social Connections: Not on file  Intimate Partner Violence: Not At Risk  . Fear of Current or Ex-Partner: No  . Emotionally Abused: No  . Physically Abused: No  . Sexually Abused: No    Past Surgical History:  Procedure Laterality Date  . BLEPHAROPLASTY  2008, 2009  . BREAST EXCISIONAL BIOPSY Left 05/1993   neg  . BREAST EXCISIONAL BIOPSY Left 09/1993   neg  . CATARACT EXTRACTION  2008  . CERVICAL POLYPECTOMY  2014  . COLONOSCOPY    . COLONOSCOPY WITH PROPOFOL N/A 12/25/2019    Procedure: COLONOSCOPY WITH PROPOFOL;  Surgeon: Jonathon Bellows, MD;  Location[facility] ENDOSCOPY;  Service: Gastroenterology;  Laterality: N/A;  . DILATION AND CURETTAGE OF UTERUS    . HYSTEROSCOPY  2014  . UPPER ENDOSCOPY W/ ANTRODUODENAL MANOMETRY      Family History  Problem Relation Age of Onset  . Breast cancer Maternal Grandmother 30  . Cancer Maternal Grandmother        breast  . Early death Maternal Grandmother   . Hearing loss Mother   . Heart disease Mother   . Cancer Father        colon  . Hearing loss Father   . Diabetes Sister   . Early death Paternal Grandfather     No Known Allergies  Current Outpatient Medications on File Prior to Visit  Medication Sig Dispense Refill  . Ascorbic Acid (VITAMIN C PO) Take 1,000 Units by mouth daily.    Marland Kitchen aspirin EC 81 MG tablet Take 81 mg by mouth daily.    Marland Kitchen buPROPion (WELLBUTRIN XL) 300 MG 24 hr tablet TAKE 1 TABLET (300 MG TOTAL) BY MOUTH DAILY. FOR ANXIETY/DEPRESSION. 90 tablet 3  . Cholecalciferol (VITAMIN D3 PO) Take 1,200 mg by mouth daily.    . citalopram (CELEXA) 40 MG tablet TAKE 1 TABLET (40 MG TOTAL) BY MOUTH DAILY. FOR ANXIETY/DEPRESSION. 90 tablet 3  . Cyanocobalamin (VITAMIN B12 PO) Take 2,500 mcg by mouth daily.    Marland Kitchen levothyroxine (SYNTHROID) 50 MCG tablet TAKE 1 TABLET BY on Sunday ON EMPTY STOMACH WITH WATER, NO FOOD OR OTHER MEDS FOR 30 MIN 12 tablet 1  . levothyroxine (SYNTHROID) 75 MCG tablet TAKE 1 TABLET BY MOUTH ON MONDAY THRU SATURDAY ON EMPTY STOMACH WITH WATER, NO FOOD OR OTHER MEDS FOR 30 MIN 72 tablet 1  . losartan (COZAAR) 25 MG tablet TAKE 1 TABLET (25 MG TOTAL) BY MOUTH DAILY. FOR KIDNEY PROTECTION. 90 tablet 1  . Omega-3 Fatty Acids (FISH OIL PO) Take 360 mg by mouth daily.    Marland Kitchen omeprazole (PRILOSEC) 20 MG capsule TAKE 1 CAPSULE (20 MG TOTAL) BY MOUTH DAILY. FOR HEARTBURN. 90 capsule 0  . polyethylene glycol (MIRALAX / GLYCOLAX) packet Take 17 g by mouth daily.     . prochlorperazine (COMPAZINE) 10 MG  tablet Take 1 tablet (10 mg total) by mouth every 6 (six) hours as needed for nausea or vomiting. 30 tablet 0  . rosuvastatin (CRESTOR) 20 MG tablet Take 1 tablet (20 mg total) by mouth daily. 90 tablet 3  . traZODone (DESYREL) 50 MG tablet Take 1 tablet (50 mg total) by mouth at bedtime as needed for sleep. 90 tablet 3   No current facility-administered medications on file prior to visit.    BP 132/70   Pulse 66   Temp 98.7 F (  37.1 C) (Temporal)   Ht 4\' 11"  (1.499 m)   Wt 162 lb (73.5 kg)   SpO2 97%   BMI 32.72 kg/m    Objective:   Physical Exam Constitutional:      Appearance: She is well-nourished.  Cardiovascular:     Rate and Rhythm: Normal rate and regular rhythm.  Pulmonary:     Effort: Pulmonary effort is normal.     Breath sounds: Normal breath sounds.  Abdominal:     General: Abdomen is flat. There is no distension.     Palpations: Abdomen is soft.  Musculoskeletal:     Cervical back: Neck supple.     Comments: Decrease in range of motion to lumbar spine with flexion extension, rotation.  Skin:    General: Skin is warm and dry.  Psychiatric:        Mood and Affect: Mood and affect normal.            Assessment & Plan:

## 2020-12-11 NOTE — Assessment & Plan Note (Signed)
Doing well on omeprazole 20 mg daily, continue same.

## 2020-12-11 NOTE — Assessment & Plan Note (Signed)
Stable, repeat labs pending.  Avoid recurrent use of NSAIDs. Continue losartan 25 mg daily.

## 2020-12-11 NOTE — Assessment & Plan Note (Signed)
She taking levothyroxine 50 mcg on Sunday, also 75 mcg on all other days.   She appears to be taking correctly. Repeat TSH pending.  Continue current regimen.

## 2020-12-11 NOTE — Assessment & Plan Note (Signed)
Denies concerns today. °Continue to monitor. °

## 2020-12-11 NOTE — Assessment & Plan Note (Signed)
Followed with pulmonology last year, cough resolved.

## 2020-12-11 NOTE — Assessment & Plan Note (Addendum)
CT chest from July 2021 reviewed, also reviewed PET scan from October 2021.  Patient due for repeat CT chest in either October 2022 or April 2023.  Patient updated.

## 2020-12-11 NOTE — Assessment & Plan Note (Signed)
Overall feels well managed on citalopram 40 mg and bupropion XL 300 mg, continue same.

## 2020-12-11 NOTE — Assessment & Plan Note (Signed)
Compliant to rosuvastatin 20 mg, repeat lipid panel pending. Following with cardiology. Continue current regimen.

## 2020-12-11 NOTE — Assessment & Plan Note (Signed)
Immunizations up-to-date. Mammogram up-to-date. Bone density due, she would prefer to have this done later this year with her mammogram.  Colonoscopy up-to-date, due in 2024. Recommended regular exercise and a healthy diet.

## 2020-12-11 NOTE — Patient Instructions (Addendum)
Start prednisone 20 mg tablets for sciatica. Take 3 tablets by mouth once daily for two days, then 2 tablets for three days, then 1 tablet for three days.  Let me know if no improvement and/or if your pain returns.  Start exercising. You should be getting 150 minutes of exercise weekly.  The CT of your chest is due in October 2022 or April 2023.  It was a pleasure to see you today!

## 2020-12-11 NOTE — Assessment & Plan Note (Signed)
Acute on chronic flare, appears uncomfortable today. No alarm signs on exam or HPI.  Prescription for prednisone course sent to pharmacy.  Discussed walking/stretching daily.  She will update.  If no improvement then consider physical therapy.

## 2020-12-17 DIAGNOSIS — H16223 Keratoconjunctivitis sicca, not specified as Sjogren's, bilateral: Secondary | ICD-10-CM | POA: Diagnosis not present

## 2020-12-19 DIAGNOSIS — G8929 Other chronic pain: Secondary | ICD-10-CM

## 2020-12-19 DIAGNOSIS — M5442 Lumbago with sciatica, left side: Secondary | ICD-10-CM

## 2020-12-19 MED ORDER — TIZANIDINE HCL 4 MG PO TABS: 4.0000 mg | ORAL_TABLET | Freq: Three times a day (TID) | ORAL | 0 refills | Status: DC | PRN

## 2020-12-20 ENCOUNTER — Ambulatory Visit (INDEPENDENT_AMBULATORY_CARE_PROVIDER_SITE_OTHER)
Admission: RE | Admit: 2020-12-20 | Discharge: 2020-12-20 | Disposition: A | Discharge status: Home / Self Care | Payer: Medicare Other | Source: Ambulatory Visit | Attending: Primary Care | Admitting: Primary Care

## 2020-12-20 DIAGNOSIS — G8929 Other chronic pain: Secondary | ICD-10-CM

## 2020-12-20 DIAGNOSIS — M5442 Lumbago with sciatica, left side: Secondary | ICD-10-CM

## 2020-12-20 DIAGNOSIS — M545 Low back pain, unspecified: Secondary | ICD-10-CM | POA: Diagnosis not present

## 2020-12-24 DIAGNOSIS — G8929 Other chronic pain: Secondary | ICD-10-CM

## 2020-12-25 MED ORDER — TIZANIDINE HCL 4 MG PO TABS
4.0000 mg | ORAL_TABLET | Freq: Three times a day (TID) | ORAL | 0 refills | Status: DC | PRN
Start: 2020-12-25 — End: 2021-12-19

## 2021-01-20 DIAGNOSIS — H16223 Keratoconjunctivitis sicca, not specified as Sjogren's, bilateral: Secondary | ICD-10-CM | POA: Diagnosis not present

## 2021-01-30 NOTE — Telephone Encounter (Signed)
Pt scheduled appt on 4/20

## 2021-02-05 ENCOUNTER — Other Ambulatory Visit: Payer: Self-pay

## 2021-02-05 ENCOUNTER — Encounter: Payer: Self-pay | Admitting: Primary Care

## 2021-02-05 ENCOUNTER — Ambulatory Visit (INDEPENDENT_AMBULATORY_CARE_PROVIDER_SITE_OTHER): Payer: Medicare Other | Admitting: Primary Care

## 2021-02-05 DIAGNOSIS — R1084 Generalized abdominal pain: Secondary | ICD-10-CM

## 2021-02-05 DIAGNOSIS — G2581 Restless legs syndrome: Secondary | ICD-10-CM

## 2021-02-05 DIAGNOSIS — R109 Unspecified abdominal pain: Secondary | ICD-10-CM | POA: Insufficient documentation

## 2021-02-05 DIAGNOSIS — R519 Headache, unspecified: Secondary | ICD-10-CM

## 2021-02-05 HISTORY — DX: Unspecified abdominal pain: R10.9

## 2021-02-05 MED ORDER — PROCHLORPERAZINE MALEATE 10 MG PO TABS: 10.0000 mg | ORAL_TABLET | Freq: Three times a day (TID) | ORAL | 0 refills | Status: DC | PRN

## 2021-02-05 NOTE — Patient Instructions (Signed)
Be sure to eat plenty of fiber.  Ensure you are consuming 64 ounces of water daily.  Start exercising. You should be getting 150 minutes  exercise weekly.  Try moving your citalopram to the evening, this may help with drowsiness.  It was a pleasure to see you today!

## 2021-02-05 NOTE — Assessment & Plan Note (Signed)
Acute for two weeks, symptoms abated two days ago. Exam today unremarkable. Colonoscopy reviewed from .  She will update if symptoms return.

## 2021-02-05 NOTE — Progress Notes (Signed)
Subjective:    Patient ID: Grace Ortiz, female    DOB: [birth year], 75 y.o.   MRN: [id number]  HPI  Grace Ortiz is a very pleasant 75 y.o. female with a history of hypothyroidism, chronic back pain, CKD, frequent headaches who presents today with a chief complaint of abdominal pain. She is also requesting a refill of her Compazine for which she uses sparingly for nausea due to migraines.   She sent a message via My Chart on 01/27/21 with reports of "upset intestine", gas, loose stools. She endorsed these symptoms have occurred previously, in 2019, for which the cause was due to elevated TSH and too much Miralax. Given these symptoms she was asked to come in for an evaluation.  Today she endorses symptoms began about two weeks ago. She's taken Imodium PRN with improvement. She denies bloody stools, nausea, vomiting. Her symptoms abated two days ago. Last bowel movement was two days ago.   She follows with GI, colonoscopy recently completed which showed diverticulosis to the sigmoid colon.    Review of Systems  Gastrointestinal: Negative for abdominal pain, blood in stool, constipation, diarrhea and nausea.         Past Medical History:  Diagnosis Date  . Acute UTI 08/02/2018  . Arthritis   . Bell's palsy   . Cervical polyp   . Diverticulitis   . Frequent headaches   . GERD (gastroesophageal reflux disease)   . Hyperlipidemia   . Hypothyroidism   . Macular retinal puckering, bilateral   . Migraines     Social History   Socioeconomic History  . Marital status: Married    Spouse name: Not on file  . Number of children: Not on file  . Years of education: Not on file  . Highest education level: Not on file  Occupational History  . Not on file  Tobacco Use  . Smoking status: Former Smoker    Packs/day: 1.00    Years: 6.00    Pack years: 6.00    Types: Cigarettes    Quit date: 10/19/1970    Years since quitting: 50.3  . Smokeless tobacco: Never Used  Vaping Use  .  Vaping Use: Never used  Substance and Sexual Activity  . Alcohol use: Yes    Comment: rarely  . Drug use: No  . Sexual activity: Not on file  Other Topics Concern  . Not on file  Social History Narrative   Married.   No children.   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    Moved from [place].   Social Determinants of Health   Financial Resource Strain: Low Risk   . Difficulty of Paying Living Expenses: Not hard at all  Food Insecurity: No Food Insecurity  . Worried About Charity fundraiser in the Last Year: Never true  . Ran Out of Food in the Last Year: Never true  Transportation Needs: No Transportation Needs  . Lack of Transportation (Medical): No  . Lack of Transportation (Non-Medical): No  Physical Activity: Inactive  . Days of Exercise per Week: 0 days  . Minutes of Exercise per Session: 0 min  Stress: No Stress Concern Present  . Feeling of Stress : Not at all  Social Connections: Not on file  Intimate Partner Violence: Not At Risk  . Fear of Current or Ex-Partner: No  . Emotionally Abused: No  . Physically Abused: No  . Sexually Abused: No    Past Surgical History:  Procedure Laterality Date  .  BLEPHAROPLASTY  2008, 2009  . BREAST EXCISIONAL BIOPSY Left 05/1993   neg  . BREAST EXCISIONAL BIOPSY Left 09/1993   neg  . CATARACT EXTRACTION  2008  . CERVICAL POLYPECTOMY  2014  . COLONOSCOPY    . COLONOSCOPY WITH PROPOFOL N/A 12/25/2019   Procedure: COLONOSCOPY WITH PROPOFOL;  Surgeon: Jonathon Bellows, MD;  Location: [facility] ENDOSCOPY;  Service: Gastroenterology;  Laterality: N/A;  . DILATION AND CURETTAGE OF UTERUS    . HYSTEROSCOPY  2014  . UPPER ENDOSCOPY W/ ANTRODUODENAL MANOMETRY      Family History  Problem Relation Age of Onset  . Breast cancer Maternal Grandmother 30  . Cancer Maternal Grandmother        breast  . Early death Maternal Grandmother   . Hearing loss Mother   . Heart disease Mother   . Cancer Father        colon  . Hearing loss  Father   . Diabetes Sister   . Early death Paternal Grandfather     No Known Allergies  Current Outpatient Medications on File Prior to Visit  Medication Sig Dispense Refill  . Ascorbic Acid (VITAMIN C PO) Take 1,000 Units by mouth daily.    Marland Kitchen aspirin EC 81 MG tablet Take 81 mg by mouth daily.    Marland Kitchen buPROPion (WELLBUTRIN XL) 300 MG 24 hr tablet Take 1 tablet (300 mg total) by mouth daily. For anxiety/depression. 90 tablet 3  . Cholecalciferol (VITAMIN D3 PO) Take 1,200 mg by mouth daily.    . citalopram (CELEXA) 40 MG tablet Take 1 tablet (40 mg total) by mouth daily. For anxiety/depression. 90 tablet 3  . Cyanocobalamin (VITAMIN B12 PO) Take 2,500 mcg by mouth daily.    . cycloSPORINE (RESTASIS) 0.05 % ophthalmic emulsion     . diazepam (VALIUM) 5 MG tablet     . levothyroxine (SYNTHROID) 50 MCG tablet TAKE 1 TABLET BY on Sunday ON EMPTY STOMACH WITH WATER, NO FOOD OR OTHER MEDS FOR 30 MIN 12 tablet 1  . levothyroxine (SYNTHROID) 75 MCG tablet TAKE 1 TABLET BY MOUTH ON MONDAY THRU SATURDAY ON EMPTY STOMACH WITH WATER, NO FOOD OR OTHER MEDS FOR 30 MIN 72 tablet 1  . losartan (COZAAR) 25 MG tablet Take 1 tablet (25 mg total) by mouth daily. For kidney protection. 90 tablet 3  . Omega-3 Fatty Acids (FISH OIL PO) Take 360 mg by mouth daily.    Marland Kitchen omeprazole (PRILOSEC) 20 MG capsule Take 1 capsule (20 mg total) by mouth daily. For heartburn. 90 capsule 3  . polyethylene glycol (MIRALAX / GLYCOLAX) packet Take 17 g by mouth daily.     . prochlorperazine (COMPAZINE) 10 MG tablet Take 1 tablet (10 mg total) by mouth every 6 (six) hours as needed for nausea or vomiting. 30 tablet 0  . rosuvastatin (CRESTOR) 20 MG tablet Take 1 tablet (20 mg total) by mouth daily. 90 tablet 3  . tiZANidine (ZANAFLEX) 4 MG tablet Take 1 tablet (4 mg total) by mouth every 8 (eight) hours as needed for muscle spasms. 30 tablet 0  . traZODone (DESYREL) 50 MG tablet Take 1 tablet (50 mg total) by mouth at bedtime as needed  for sleep. 90 tablet 3   No current facility-administered medications on file prior to visit.    BP 124/78   Pulse 88   Temp 98.6 F (37 C) (Temporal)   Ht 4\' 11"  (1.499 m)   Wt 160 lb (72.6 kg)   SpO2 97%  BMI 32.32 kg/m  Objective:   Physical Exam Constitutional:      Appearance: She is not ill-appearing.  Cardiovascular:     Rate and Rhythm: Normal rate and regular rhythm.  Pulmonary:     Effort: Pulmonary effort is normal.     Breath sounds: Normal breath sounds.  Abdominal:     General: Abdomen is flat.     Palpations: Abdomen is soft.     Tenderness: There is no abdominal tenderness. There is no guarding.  Musculoskeletal:     Cervical back: Neck supple.  Skin:    General: Skin is warm and dry.           Assessment & Plan:      This visit occurred during the SARS-CoV-2 public health emergency.  Safety protocols were in place, including screening questions prior to the visit, additional usage of staff PPE, and extensive cleaning of exam room while observing appropriate contact time as indicated for disinfecting solutions.

## 2021-02-05 NOTE — Assessment & Plan Note (Signed)
Refill provided for Compazine 10 mg for which she uses sparingly.

## 2021-02-07 MED ORDER — DIAZEPAM 5 MG PO TABS: 5.0000 mg | ORAL_TABLET | Freq: Every evening | ORAL | 0 refills | Status: DC | PRN

## 2021-02-23 ENCOUNTER — Other Ambulatory Visit: Payer: Self-pay | Admitting: Primary Care

## 2021-02-23 DIAGNOSIS — K219 Gastro-esophageal reflux disease without esophagitis: Secondary | ICD-10-CM

## 2021-03-06 DIAGNOSIS — E038 Other specified hypothyroidism: Secondary | ICD-10-CM

## 2021-03-06 DIAGNOSIS — K219 Gastro-esophageal reflux disease without esophagitis: Secondary | ICD-10-CM

## 2021-03-06 DIAGNOSIS — N183 Chronic kidney disease, stage 3 unspecified: Secondary | ICD-10-CM

## 2021-03-06 MED ORDER — LEVOTHYROXINE SODIUM 50 MCG PO TABS: ORAL_TABLET | ORAL | 2 refills | Status: DC

## 2021-03-06 MED ORDER — OMEPRAZOLE 20 MG PO CPDR: 20.0000 mg | DELAYED_RELEASE_CAPSULE | Freq: Every day | ORAL | 2 refills | Status: DC

## 2021-03-11 DIAGNOSIS — K219 Gastro-esophageal reflux disease without esophagitis: Secondary | ICD-10-CM

## 2021-03-11 MED ORDER — OMEPRAZOLE 20 MG PO CPDR: 20.0000 mg | DELAYED_RELEASE_CAPSULE | Freq: Every day | ORAL | 3 refills | Status: DC

## 2021-03-11 NOTE — Telephone Encounter (Signed)
Omeprazole refill re-sent to [facility].  Not sure what My Emmi is so I will forward message to K. Clark to address that.

## 2021-03-24 DIAGNOSIS — H16223 Keratoconjunctivitis sicca, not specified as Sjogren's, bilateral: Secondary | ICD-10-CM | POA: Diagnosis not present

## 2021-04-02 DIAGNOSIS — Z23 Encounter for immunization: Secondary | ICD-10-CM | POA: Diagnosis not present

## 2021-06-04 ENCOUNTER — Other Ambulatory Visit: Payer: Self-pay | Admitting: Primary Care

## 2021-06-05 NOTE — Telephone Encounter (Signed)
Patient has lab appointment end of month called in 1 refill to last until then.

## 2021-06-09 ENCOUNTER — Other Ambulatory Visit (INDEPENDENT_AMBULATORY_CARE_PROVIDER_SITE_OTHER): Payer: Medicare Other

## 2021-06-09 ENCOUNTER — Other Ambulatory Visit: Payer: Self-pay

## 2021-06-09 DIAGNOSIS — N183 Chronic kidney disease, stage 3 unspecified: Secondary | ICD-10-CM

## 2021-06-09 DIAGNOSIS — E038 Other specified hypothyroidism: Secondary | ICD-10-CM

## 2021-06-10 ENCOUNTER — Other Ambulatory Visit: Payer: Self-pay | Admitting: Primary Care

## 2021-06-10 DIAGNOSIS — Z1231 Encounter for screening mammogram for malignant neoplasm of breast: Secondary | ICD-10-CM

## 2021-06-10 LAB — BASIC METABOLIC PANEL
BUN: 12 mg/dL (ref 6–23)
CO2: 27 mEq/L (ref 19–32)
Calcium: 10.1 mg/dL (ref 8.4–10.5)
Chloride: 104 mEq/L (ref 96–112)
Creatinine, Ser: 1.18 mg/dL (ref 0.40–1.20)
GFR: 45.33 mL/min — ABNORMAL LOW (ref >60.00)
Glucose, Bld: 57 mg/dL — ABNORMAL LOW (ref 70–99)
Potassium: 4.3 mEq/L (ref 3.5–5.1)
Sodium: 139 mEq/L (ref 135–145)

## 2021-06-10 LAB — TSH: TSH: 0.77 u[IU]/mL (ref 0.35–5.50)

## 2021-06-10 MED ORDER — LEVOTHYROXINE SODIUM 75 MCG PO TABS: ORAL_TABLET | ORAL | 1 refills | Status: DC

## 2021-06-13 DIAGNOSIS — D225 Melanocytic nevi of trunk: Secondary | ICD-10-CM | POA: Diagnosis not present

## 2021-06-13 DIAGNOSIS — L821 Other seborrheic keratosis: Secondary | ICD-10-CM | POA: Diagnosis not present

## 2021-06-13 DIAGNOSIS — D2271 Melanocytic nevi of right lower limb, including hip: Secondary | ICD-10-CM | POA: Diagnosis not present

## 2021-06-13 DIAGNOSIS — D2261 Melanocytic nevi of right upper limb, including shoulder: Secondary | ICD-10-CM | POA: Diagnosis not present

## 2021-06-13 DIAGNOSIS — D2272 Melanocytic nevi of left lower limb, including hip: Secondary | ICD-10-CM | POA: Diagnosis not present

## 2021-06-13 DIAGNOSIS — D2262 Melanocytic nevi of left upper limb, including shoulder: Secondary | ICD-10-CM | POA: Diagnosis not present

## 2021-07-04 ENCOUNTER — Other Ambulatory Visit: Payer: Self-pay

## 2021-07-04 ENCOUNTER — Ambulatory Visit
Admission: RE | Admit: 2021-07-04 | Discharge: 2021-07-04 | Disposition: A | Discharge status: Home / Self Care | Payer: Medicare Other | Source: Ambulatory Visit | Attending: Primary Care | Admitting: Primary Care

## 2021-07-04 DIAGNOSIS — Z1231 Encounter for screening mammogram for malignant neoplasm of breast: Secondary | ICD-10-CM | POA: Diagnosis not present

## 2021-07-04 DIAGNOSIS — Z23 Encounter for immunization: Secondary | ICD-10-CM | POA: Diagnosis not present

## 2021-07-07 DIAGNOSIS — H903 Sensorineural hearing loss, bilateral: Secondary | ICD-10-CM | POA: Diagnosis not present

## 2021-07-10 NOTE — Telephone Encounter (Signed)
FYI I have updated the Flu for patient and husband.

## 2021-08-08 ENCOUNTER — Ambulatory Visit
Admission: RE | Admit: 2021-08-08 | Discharge: 2021-08-08 | Disposition: A | Discharge status: Home / Self Care | Payer: Medicare Other | Source: Ambulatory Visit | Attending: Internal Medicine | Admitting: Internal Medicine

## 2021-08-08 ENCOUNTER — Other Ambulatory Visit: Payer: Self-pay

## 2021-08-08 DIAGNOSIS — R911 Solitary pulmonary nodule: Secondary | ICD-10-CM | POA: Diagnosis not present

## 2021-08-08 DIAGNOSIS — I3139 Other pericardial effusion (noninflammatory): Secondary | ICD-10-CM | POA: Diagnosis not present

## 2021-09-01 ENCOUNTER — Other Ambulatory Visit: Payer: Self-pay | Admitting: Primary Care

## 2021-09-01 DIAGNOSIS — G2581 Restless legs syndrome: Secondary | ICD-10-CM

## 2021-09-16 ENCOUNTER — Other Ambulatory Visit: Payer: Self-pay | Admitting: Cardiovascular Disease

## 2021-09-16 ENCOUNTER — Other Ambulatory Visit: Payer: Self-pay | Admitting: Primary Care

## 2021-09-16 NOTE — Telephone Encounter (Signed)
Patient needs an appt for further refills, Thanks !

## 2021-09-16 NOTE — Telephone Encounter (Signed)
I have never prescribed this.  Does her eye doctor prescribe this? Please have her request from whomever prescribes.

## 2021-09-18 NOTE — Telephone Encounter (Signed)
Can you please follow up

## 2021-09-23 NOTE — Telephone Encounter (Signed)
Called patient received from [facility] not sure why request was sent to our office will reach out to them to get refilled. No further action needed.

## 2021-09-29 DIAGNOSIS — H16223 Keratoconjunctivitis sicca, not specified as Sjogren's, bilateral: Secondary | ICD-10-CM | POA: Diagnosis not present

## 2021-10-01 NOTE — Progress Notes (Signed)
Cardiology Office Note  Date:  10/03/2021   ID:  Korbyn Vanes, DOB Jun 21, 1946, MRN [id number]  PCP:  Pleas Koch, NP   Chief Complaint  Patient presents with   Other    12 Month f/u no complaints today. Meds reviewed verbally with pt.    HPI:  [name] is a 75 year old woman with past medical history of Chronic fatigue Hyperlipidemia Moderate-sized hiatal hernia Right-sided aortic arch Aortic atherosclerosis, coronary calcification on CT Obesity Who presents for f/u of her coronary atherosclerosis on CT scan  Weight stable, actually trending down no regular exercise program  Denies chest pain concerning for angina Non-smoker, no diabetes  CT scan, discussed results right-sided aortic arch noted, unable to exclude compression on esophagus Minimal aortic atherosclerosis with 2 speckles noted Mild to moderate proximal RCA calcification noted  Lab work stable, cholesterol at goal on statin  Lab Results  Component Value Date   CHOL 129 12/11/2020   HDL 64.90 12/11/2020   LDLCALC 49 12/11/2020   TRIG 73.0 12/11/2020   EKG personally reviewed by myself on todays visit Shows normal sinus rhythm rate 67 bpm no significant ST or T wave changes  PMH:   has a past medical history of Acute UTI (08/02/2018), Arthritis, Bell's palsy, Cervical polyp, Diverticulitis, Frequent headaches, GERD (gastroesophageal reflux disease), Hyperlipidemia, Hypothyroidism, Macular retinal puckering, bilateral, and Migraines.  PSH:    Past Surgical History:  Procedure Laterality Date   BLEPHAROPLASTY  2008, 2009   BREAST EXCISIONAL BIOPSY Left 05/1993   neg   BREAST EXCISIONAL BIOPSY Left 09/1993   neg   CATARACT EXTRACTION  2008   CERVICAL POLYPECTOMY  2014   COLONOSCOPY     COLONOSCOPY WITH PROPOFOL N/A 12/25/2019   Procedure: COLONOSCOPY WITH PROPOFOL;  Surgeon: Jonathon Bellows, MD;  Location: [facility] ENDOSCOPY;  Service: Gastroenterology;  Laterality: N/A;   DILATION AND  CURETTAGE OF UTERUS     HYSTEROSCOPY  2014   UPPER ENDOSCOPY W/ ANTRODUODENAL MANOMETRY      Current Outpatient Medications  Medication Sig Dispense Refill   Ascorbic Acid (VITAMIN C PO) Take 1,000 Units by mouth daily.     aspirin EC 81 MG tablet Take 81 mg by mouth daily.     buPROPion (WELLBUTRIN XL) 300 MG 24 hr tablet Take 1 tablet (300 mg total) by mouth daily. For anxiety/depression. 90 tablet 3   Cholecalciferol (VITAMIN D3 PO) Take 1,200 mg by mouth daily.     citalopram (CELEXA) 40 MG tablet Take 1 tablet (40 mg total) by mouth daily. For anxiety/depression. 90 tablet 3   Cyanocobalamin (VITAMIN B12 PO) Take 2,500 mcg by mouth daily.     cycloSPORINE (RESTASIS) 0.05 % ophthalmic emulsion      diazepam (VALIUM) 5 MG tablet TAKE ONE TABLET BY MOUTH AT BEDTIME AS NEEDED FOR RESTLESS LEGS 10 tablet 0   levothyroxine (SYNTHROID) 50 MCG tablet TAKE 1 TABLET BY on Sunday ON EMPTY STOMACH WITH WATER, NO FOOD OR OTHER MEDS FOR 30 MIN 12 tablet 2   levothyroxine (SYNTHROID) 75 MCG tablet Take 1 tablet by mouth every morning on an empty stomach with water only Monday through Saturday.  No food or other medications for 30 minutes. 72 tablet 1   losartan (COZAAR) 25 MG tablet Take 1 tablet (25 mg total) by mouth daily. For kidney protection. 90 tablet 3   Omega-3 Fatty Acids (FISH OIL PO) Take 360 mg by mouth daily.     omeprazole (PRILOSEC) 20 MG capsule Take  1 capsule (20 mg total) by mouth daily. For heartburn. 90 capsule 3   polyethylene glycol (MIRALAX / GLYCOLAX) packet Take 17 g by mouth daily.      prochlorperazine (COMPAZINE) 10 MG tablet Take 1 tablet (10 mg total) by mouth every 8 (eight) hours as needed for nausea or vomiting. 30 tablet 0   rosuvastatin (CRESTOR) 20 MG tablet TAKE ONE TABLET BY MOUTH DAILY 90 tablet 3   tiZANidine (ZANAFLEX) 4 MG tablet Take 1 tablet (4 mg total) by mouth every 8 (eight) hours as needed for muscle spasms. 30 tablet 0   traZODone (DESYREL) 50 MG  tablet Take 1 tablet (50 mg total) by mouth at bedtime as needed for sleep. 90 tablet 3   No current facility-administered medications for this visit.     Allergies:   Patient has no known allergies.   Social History:  The patient  reports that she quit smoking about 50 years ago. Her smoking use included cigarettes. She has a 6.00 pack-year smoking history. She has never used smokeless tobacco. She reports current alcohol use. She reports that she does not use drugs.   Family History:   family history includes Breast cancer (age of onset: 72) in her maternal grandmother; Cancer in her father and maternal grandmother; Diabetes in her sister; Early death in her maternal grandmother and paternal grandfather; Hearing loss in her father and mother; Heart disease in her mother.    Review of Systems: Review of Systems  Constitutional: Negative.   HENT: Negative.    Respiratory: Negative.    Cardiovascular: Negative.   Gastrointestinal: Negative.   Musculoskeletal: Negative.   Neurological: Negative.   Psychiatric/Behavioral: Negative.    All other systems reviewed and are negative.  PHYSICAL EXAM: VS:  BP 120/64 (BP Location: Left Arm, Patient Position: Sitting, Cuff Size: Large)    Pulse 67    Ht 4\' 11"  (1.499 m)    Wt 156 lb 4 oz (70.9 kg)    SpO2 97%    BMI 31.56 kg/m  , BMI Body mass index is 31.56 kg/m. Constitutional:  oriented to person, place, and time. No distress.  HENT:  Head: Grossly normal Eyes:  no discharge. No scleral icterus.  Neck: No JVD, no carotid bruits  Cardiovascular: Regular rate and rhythm, no murmurs appreciated Pulmonary/Chest: Clear to auscultation bilaterally, no wheezes or rails Abdominal: Soft.  no distension.  no tenderness.  Musculoskeletal: Normal range of motion Neurological:  normal muscle tone. Coordination normal. No atrophy Skin: Skin warm and dry Psychiatric: normal affect, pleasant  Recent Labs: 12/11/2020: ALT 19; Hemoglobin 14.0;  Platelets 167.0 06/09/2021: BUN 12; Creatinine, Ser 1.18; Potassium 4.3; Sodium 139; TSH 0.77    Lipid Panel Lab Results  Component Value Date   CHOL 129 12/11/2020   HDL 64.90 12/11/2020   LDLCALC 49 12/11/2020   TRIG 73.0 12/11/2020      Wt Readings from Last 3 Encounters:  10/03/21 156 lb 4 oz (70.9 kg)  02/05/21 160 lb (72.6 kg)  12/11/20 162 lb (73.5 kg)     ASSESSMENT AND PLAN:  Problem List Items Addressed This Visit       Cardiology Problems   Hyperlipidemia   Other Visit Diagnoses     Aortic atherosclerosis ([place])    -  Primary   Relevant Orders   EKG 12-Lead   Coronary artery calcification seen on CAT scan       Relevant Orders   EKG 12-Lead   Chronic fatigue  Hiatal hernia         Aortic atherosclerosis Minimal disease noted, 2 speckles on review of images Cholesterol at goal, no diabetes  Coronary calcification Mild to moderate in the proximal RCA, no anginal symptoms LDL less than 70  Hyperlipidemia Cholesterol is at goal on the current lipid regimen. No changes to the medications were made.  Obesity Weight trending downward Recommend regular walking program  Hiatal hernia Moderate in size, some indigestion No significant symptoms on today's visit    Total encounter time more than 25 minutes  Greater than 50% was spent in counseling and coordination of care with the patient    Signed, Esmond Plants, M.D., Ph.D. [place], [place]

## 2021-10-03 ENCOUNTER — Other Ambulatory Visit: Payer: Self-pay

## 2021-10-03 ENCOUNTER — Ambulatory Visit (INDEPENDENT_AMBULATORY_CARE_PROVIDER_SITE_OTHER): Payer: Medicare Other | Admitting: Cardiovascular Disease

## 2021-10-03 ENCOUNTER — Encounter: Payer: Self-pay | Admitting: Cardiovascular Disease

## 2021-10-03 VITALS — BP 120/64 | HR 67 | Ht 59.0 in | Wt 156.2 lb

## 2021-10-03 DIAGNOSIS — R5382 Chronic fatigue, unspecified: Secondary | ICD-10-CM

## 2021-10-03 DIAGNOSIS — E782 Mixed hyperlipidemia: Secondary | ICD-10-CM

## 2021-10-03 DIAGNOSIS — K449 Diaphragmatic hernia without obstruction or gangrene: Secondary | ICD-10-CM | POA: Diagnosis not present

## 2021-10-03 DIAGNOSIS — I2510 Atherosclerotic heart disease of native coronary artery without angina pectoris: Secondary | ICD-10-CM

## 2021-10-03 DIAGNOSIS — I700 Atherosclerosis of aorta: Secondary | ICD-10-CM | POA: Diagnosis not present

## 2021-10-03 NOTE — Patient Instructions (Addendum)
Medication Instructions:  No changes  If you need a refill on your cardiac medications before your next appointment, please call your pharmacy.   Lab work: No new labs needed  Testing/Procedures: No new testing needed  Follow-Up: At [facility], you and your health needs are our priority.  As part of our continuing mission to provide you with exceptional heart care, we have created designated Provider Care Teams.  These Care Teams include your primary Cardiologist (physician) and Advanced Practice Providers (APPs -  Physician Assistants and Nurse Practitioners) who all work together to provide you with the care you need, when you need it.  You will need a follow up appointment in 12 months  Providers on your designated Care Team:   Christopher Berge, NP Ryan Dunn, PA-C Cadence Furth, PA-C  COVID-19 Vaccine Information can be found at: [email or web address] For questions related to vaccine distribution or appointments, please email [email or web address] or call [phone].   

## 2021-11-25 NOTE — Progress Notes (Signed)
Subjective:   Grace Ortiz is a 76 y.o. female who presents for Medicare Annual (Subsequent) preventive examination.  I connected with Carmon Ginsberg today by telephone and verified that I am speaking with the correct person using two identifiers. Location patient: home Location provider: work Persons participating in the virtual visit: patient, Marine scientist.    I discussed the limitations, risks, security and privacy concerns of performing an evaluation and management service by telephone and the availability of in person appointments. I also discussed with the patient that there may be a patient responsible charge related to this service. The patient expressed understanding and verbally consented to this telephonic visit.    Interactive audio and video telecommunications were attempted between this provider and patient, however failed, due to patient having technical difficulties OR patient did not have access to video capability.  We continued and completed visit with audio only.  Some vital signs may be absent or patient reported.   Time Spent with patient on telephone encounter: 20 minutes  Review of Systems     Cardiac Risk Factors include: advanced age (>4men, >22 women);dyslipidemia     Objective:    Today's Vitals   11/28/21 1437  Weight: 156 lb (70.8 kg)  Height: 4\' 11"  (1.499 m)  PainSc: 3    Body mass index is 31.51 kg/m.  Advanced Directives 11/28/2021 11/27/2020 12/25/2019 11/02/2019 10/31/2018 08/02/2018 08/02/2018  Does Patient Have a Medical Advance Directive? Yes Yes Yes Yes Yes No No  Type of Paramedic of [place];Living will [place];Living will [place];Living will [place];Living will [place];Living will - -  Does patient want to make changes to medical advance directive? Yes (MAU/Ambulatory/Procedural Areas - Information given) - - - - - -  Copy of Healthcare  Power of Attorney in Chart? - No - copy requested No - copy requested No - copy requested No - copy requested - -  Would patient like information on creating a medical advance directive? - - - - - No - Patient declined No - Patient declined    Current Medications (verified) Outpatient Encounter Medications as of 11/28/2021  Medication Sig   Ascorbic Acid (VITAMIN C PO) Take 1,000 Units by mouth daily.   aspirin EC 81 MG tablet Take 81 mg by mouth daily.   buPROPion (WELLBUTRIN XL) 300 MG 24 hr tablet Take 1 tablet (300 mg total) by mouth daily. For anxiety/depression.   Cholecalciferol (VITAMIN D3 PO) Take 1,200 mg by mouth daily.   citalopram (CELEXA) 40 MG tablet Take 1 tablet (40 mg total) by mouth daily. For anxiety/depression.   Cyanocobalamin (VITAMIN B12 PO) Take 2,500 mcg by mouth daily.   cycloSPORINE (RESTASIS) 0.05 % ophthalmic emulsion    diazepam (VALIUM) 5 MG tablet TAKE ONE TABLET BY MOUTH AT BEDTIME AS NEEDED FOR RESTLESS LEGS   levothyroxine (SYNTHROID) 50 MCG tablet TAKE 1 TABLET BY on Sunday ON EMPTY STOMACH WITH WATER, NO FOOD OR OTHER MEDS FOR 30 MIN   levothyroxine (SYNTHROID) 75 MCG tablet Take 1 tablet by mouth every morning on an empty stomach with water only Monday through Saturday.  No food or other medications for 30 minutes.   losartan (COZAAR) 25 MG tablet Take 1 tablet (25 mg total) by mouth daily. For kidney protection.   Omega-3 Fatty Acids (FISH OIL PO) Take 360 mg by mouth daily.   omeprazole (PRILOSEC) 20 MG capsule Take 1 capsule (20 mg total)  by mouth daily. For heartburn.   polyethylene glycol (MIRALAX / GLYCOLAX) packet Take 17 g by mouth daily.    prochlorperazine (COMPAZINE) 10 MG tablet Take 1 tablet (10 mg total) by mouth every 8 (eight) hours as needed for nausea or vomiting.   rosuvastatin (CRESTOR) 20 MG tablet TAKE ONE TABLET BY MOUTH DAILY   tiZANidine (ZANAFLEX) 4 MG tablet Take 1 tablet (4 mg total) by mouth every 8 (eight) hours as needed for  muscle spasms.   traZODone (DESYREL) 50 MG tablet Take 1 tablet (50 mg total) by mouth at bedtime as needed for sleep.   No facility-administered encounter medications on file as of 11/28/2021.    Allergies (verified) Patient has no known allergies.   History: Past Medical History:  Diagnosis Date   Acute UTI 08/02/2018   Arthritis    Bell's palsy    Cervical polyp    Diverticulitis    Frequent headaches    GERD (gastroesophageal reflux disease)    Hyperlipidemia    Hypothyroidism    Macular retinal puckering, bilateral    Migraines    Past Surgical History:  Procedure Laterality Date   BLEPHAROPLASTY  2008, 2009   BREAST EXCISIONAL BIOPSY Left 05/1993   neg   BREAST EXCISIONAL BIOPSY Left 09/1993   neg   CATARACT EXTRACTION  2008   CERVICAL POLYPECTOMY  2014   COLONOSCOPY     COLONOSCOPY WITH PROPOFOL N/A 12/25/2019   Procedure: COLONOSCOPY WITH PROPOFOL;  Surgeon: Jonathon Bellows, MD;  Location[facility] ENDOSCOPY;  Service: Gastroenterology;  Laterality: N/A;   DILATION AND CURETTAGE OF UTERUS     HYSTEROSCOPY  2014   UPPER ENDOSCOPY W/ ANTRODUODENAL MANOMETRY     Family History  Problem Relation Age of Onset   Breast cancer Maternal Grandmother 7   Cancer Maternal Grandmother        breast   Early death Maternal Grandmother    Hearing loss Mother    Heart disease Mother    Cancer Father        colon   Hearing loss Father    Diabetes Sister    Early death Paternal Grandfather    Social History   Socioeconomic History   Marital status: Married    Spouse name: Not on file   Number of children: Not on file   Years of education: Not on file   Highest education level: Not on file  Occupational History   Not on file  Tobacco Use   Smoking status: Former    Packs/day: 1.00    Years: 6.00    Pack years: 6.00    Types: Cigarettes    Quit date: 10/19/1970    Years since quitting: 51.1   Smokeless tobacco: Never  Vaping Use   Vaping Use: Never used  Substance  and Sexual Activity   Alcohol use: Yes    Comment: rarely   Drug use: No   Sexual activity: Not on file  Other Topics Concern   Not on file  Social History Narrative   Married.   No children.   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    Moved from [place].   Social Determinants of Health   Financial Resource Strain: Low Risk    Difficulty of Paying Living Expenses: Not hard at all  Food Insecurity: No Food Insecurity   Worried About Charity fundraiser in the Last Year: Never true   Ran Out of Food in the Last Year: Never true  Transportation Needs: No Data processing manager (Medical): No   Lack of Transportation (Non-Medical): No  Physical Activity: Insufficiently Active   Days of Exercise per Week: 3 days   Minutes of Exercise per Session: 30 min  Stress: No Stress Concern Present   Feeling of Stress : Not at all  Social Connections: Moderately Isolated   Frequency of Communication with Friends and Family: Twice a week   Frequency of Social Gatherings with Friends and Family: Three times a week   Attends Religious Services: Never   Active Member of Clubs or Organizations: No   Attends Archivist Meetings: Never   Marital Status: Married    Tobacco Counseling Counseling given: Not Answered   Clinical Intake:     Pain : 0-10 Pain Score: 3  Pain Location: Shoulder Pain Orientation: Right     BMI - recorded: 31.51 Nutritional Status: BMI > 30  Obese Nutritional Risks: None Diabetes: No  How often do you need to have someone help you when you read instructions, pamphlets, or other written materials from your doctor or pharmacy?: 1 - Never  Diabetic? No  Interpreter Needed?: No  Information entered by :: Orrin Brigham LPN   Activities of Daily Living In your present state of health, do you have any difficulty performing the following activities: 11/28/2021  Hearing? Y  Comment wears hearing aids  Vision? N   Difficulty concentrating or making decisions? N  Walking or climbing stairs? N  Dressing or bathing? N  Doing errands, shopping? N  Preparing Food and eating ? N  Using the Toilet? N  In the past six months, have you accidently leaked urine? Y  Comment when sneezing  Do you have problems with loss of bowel control? Y  Comment was taking miralax  Managing your Medications? N  Managing your Finances? N  Housekeeping or managing your Housekeeping? N  Some recent data might be hidden    Patient Care Team: Pleas Koch, NP as PCP - General (Internal Medicine) Leandrew Koyanagi, MD as Referring Physician (Ophthalmology)  Indicate any recent Medical Services you may have received from other than [facility] providers in the past year (date may be approximate).     Assessment:   This is a routine wellness examination for [place].  Hearing/Vision screen Hearing Screening - Comments:: Wears hearing aids  Vision Screening - Comments:: Last exam 6 months ago, 2022 Dr. Jeni Salles  Dietary issues and exercise activities discussed: Current Exercise Habits: Home exercise routine, Type of exercise: walking;Other - see comments (push ups and walking in place), Time (Minutes): 30, Frequency (Times/Week): 3, Weekly Exercise (Minutes/Week): 90, Intensity: Mild   Goals Addressed             This Visit's Progress    Patient Stated       Would like to walk more and get into an exercise routine       Depression Screen PHQ 2/9 Scores 11/28/2021 11/27/2020 11/02/2019 10/31/2018 10/31/2018 10/28/2017  PHQ - 2 Score 0 2 1 0 2 0  PHQ- 9 Score - 2 1 0 5 3    Fall Risk Fall Risk  11/28/2021 11/27/2020 11/02/2019 10/31/2018 10/31/2018  Falls in the past year? 1 0 0 1 1  Comment - - - 2 falls caused by tripping and losing balance; brusing  -  Number falls in past yr: 0 0 0 1 1  Injury with Fall? 0 0 0 1 1  Risk for fall due  to : Other (Comment) No Fall Risks Medication side effect - Impaired  balance/gait;Impaired mobility  Risk for fall due to: Comment lost balance - - - -  Follow up Falls prevention discussed Falls evaluation completed;Falls prevention discussed Falls evaluation completed;Falls prevention discussed - -    FALL RISK PREVENTION PERTAINING TO THE HOME:  Any stairs in or around the home? No  If so, are there any without handrails? No  Home free of loose throw rugs in walkways, pet beds, electrical cords, etc? Yes  Adequate lighting in your home to reduce risk of falls? Yes   ASSISTIVE DEVICES UTILIZED TO PREVENT FALLS:  Life alert? No  Use of a cane, walker or w/c? No  Grab bars in the bathroom? Yes  Shower chair or bench in shower? Yes  Elevated toilet seat or a handicapped toilet? No   TIMED UP AND GO:  Was the test performed? No .    Cognitive Function: Normal cognitive status assessed by this Nurse Health Advisor. No abnormalities found.   MMSE - Mini Mental State Exam 11/27/2020 11/02/2019 10/31/2018 10/28/2017  Orientation to time 5 5 5 5   Orientation to Place 5 5 5 5   Registration 3 3 3 3   Attention/ Calculation 5 5 0 0  Recall 3 3 3 3   Language- name 2 objects - - 0 0  Language- repeat 1 1 1 1   Language- follow 3 step command - - 3 3  Language- read & follow direction - - 0 0  Write a sentence - - 0 0  Copy design - - 0 0  Total score - - 20 20        Immunizations Immunization History  Administered Date(s) Administered   Influenza Split 08/06/2009   Influenza, High Dose Seasonal PF 08/31/2012, 07/26/2013, 08/01/2014, 07/31/2015, 08/05/2016, 07/29/2018, 06/09/2019, 06/25/2020, 07/04/2021   Influenza,inj,Quad PF,6+ Mos 06/30/2017   Influenza-Unspecified 07/29/2018, 06/09/2019   PFIZER Comirnaty(Gray Top)Covid-19 Tri-Sucrose Vaccine 04/02/2021   PFIZER(Purple Top)SARS-COV-2 Vaccination 01/15/2020, 02/07/2020, 08/29/2020   Pneumococcal Conjugate-13 06/30/2012, 10/25/2013   Pneumococcal Polysaccharide-23 09/07/2012, 07/27/2016   Tdap  10/29/2015, 06/01/2016   Zoster Recombinat (Shingrix) 11/10/2019, 01/08/2020   Zoster, Live 02/11/2011    TDAP status: Up to date  Flu Vaccine status: Up to date  Pneumococcal vaccine status: Up to date  Covid-19 vaccine status: Information provided on how to obtain vaccines.   Qualifies for Shingles Vaccine? Yes   Zostavax completed Yes   Shingrix Completed?: Yes  Screening Tests Health Maintenance  Topic Date Due   COVID-19 Vaccine (5 - Booster for Pfizer series) 05/28/2021   MAMMOGRAM  07/04/2022   COLONOSCOPY (Pts 45-14yrs Insurance coverage will need to be confirmed)  12/25/2022   TETANUS/TDAP  06/01/2026   Pneumonia Vaccine 46+ Years old  Completed   INFLUENZA VACCINE  Completed   DEXA SCAN  Completed   Hepatitis C Screening  Completed   Zoster Vaccines- Shingrix  Completed   HPV VACCINES  Aged Out    Health Maintenance  Health Maintenance Due  Topic Date Due   COVID-19 Vaccine (5 - Booster for [facility] series) 05/28/2021    Colorectal cancer screening: Type of screening: Colonoscopy. Completed 12/25/19. Repeat every 3 years  Mammogram status: Completed 07/04/21. Repeat every year  Bone Density: Completed per patient, patient plans to have information sent to update chart.   Lung Cancer Screening: (Low Dose CT Chest recommended if Age 29-80 years, 30 pack-year currently smoking OR have quit w/in 15years.) does not qualify.  Additional Screening:  Hepatitis C Screening: does qualify; Completed 10/20/15  Vision Screening: Recommended annual ophthalmology exams for early detection of glaucoma and other disorders of the eye. Is the patient up to date with their annual eye exam?  Yes  Who is the provider or what is the name of the office in which the patient attends annual eye exams? Dr. Jeni Salles    Dental Screening: Recommended annual dental exams for proper oral hygiene  Community Resource Referral / Chronic Care Management: CRR required this visit?  No    CCM required this visit?  No      Plan:     I have personally reviewed and noted the following in the patients chart:   Medical and social history Use of alcohol, tobacco or illicit drugs  Current medications and supplements including opioid prescriptions.  Functional ability and status Nutritional status Physical activity Advanced directives List of other physicians Hospitalizations, surgeries, and ER visits in previous 12 months Vitals Screenings to include cognitive, depression, and falls Referrals and appointments  In addition, I have reviewed and discussed with patient certain preventive protocols, quality metrics, and best practice recommendations. A written personalized care plan for preventive services as well as general preventive health recommendations were provided to patient.   Due to this being a telephonic visit, the after visit summary with patients personalized plan was offered to patient via mail or my-chart. Patient would like to access on my-chart.    Loma Messing, LPN   [id number]/1779   Nurse Health Advisor  Nurse Notes: none

## 2021-11-28 ENCOUNTER — Ambulatory Visit (INDEPENDENT_AMBULATORY_CARE_PROVIDER_SITE_OTHER): Payer: Medicare Other

## 2021-11-28 VITALS — Ht 59.0 in | Wt 156.0 lb

## 2021-11-28 DIAGNOSIS — Z0000 Encounter for general adult medical examination without abnormal findings: Secondary | ICD-10-CM

## 2021-11-28 NOTE — Patient Instructions (Addendum)
Grace Ortiz , Thank you for taking time to come for your Medicare Wellness Visit. I appreciate your ongoing commitment to your health goals. Please review the following plan we discussed and let me know if I can assist you in the future.   Screening recommendations/referrals: Colonoscopy: up to date , completed 12/25/19, due 12/25/22, 3-5 years Mammogram: up to date, 07/04/21, due 07/04/22 Bone Density: up to date, please have updated report sent for your records Recommended yearly ophthalmology/optometry visit for glaucoma screening and checkup Recommended yearly dental visit for hygiene and checkup  Vaccinations: Influenza vaccine: up to date Pneumococcal vaccine: up to date  Tdap vaccine: up to date, completed 06/01/16, due 06/01/26 Shingles vaccine: up to date   Covid-19:newest booster available at your local pharmacy  Advanced directives: Please bring a copy of Living Will and/or [place] for your chart.   Conditions/risks identified: see problem list   Next appointment: Follow up in one year for your annual wellness visit 11/30/22 @ 12:00pm, this will be a telephone visit   Preventive Care 65 Years and Older, Female Preventive care refers to lifestyle choices and visits with your health care provider that can promote health and wellness. What does preventive care include? A yearly physical exam. This is also called an annual well check. Dental exams once or twice a year. Routine eye exams. Ask your health care provider how often you should have your eyes checked. Personal lifestyle choices, including: Daily care of your teeth and gums. Regular physical activity. Eating a healthy diet. Avoiding tobacco and drug use. Limiting alcohol use. Practicing safe sex. Taking low-dose aspirin every day. Taking vitamin and mineral supplements as recommended by your health care provider. What happens during an annual well check? The services and screenings done by your  health care provider during your annual well check will depend on your age, overall health, lifestyle risk factors, and family history of disease. Counseling  Your health care provider may ask you questions about your: Alcohol use. Tobacco use. Drug use. Emotional well-being. Home and relationship well-being. Sexual activity. Eating habits. History of falls. Memory and ability to understand (cognition). Work and work Statistician. Reproductive health. Screening  You may have the following tests or measurements: Height, weight, and BMI. Blood pressure. Lipid and cholesterol levels. These may be checked every 5 years, or more frequently if you are over 62 years old. Skin check. Lung cancer screening. You may have this screening every year starting at age 12 if you have a 30-pack-year history of smoking and currently smoke or have quit within the past 15 years. Fecal occult blood test (FOBT) of the stool. You may have this test every year starting at age 40. Flexible sigmoidoscopy or colonoscopy. You may have a sigmoidoscopy every 5 years or a colonoscopy every 10 years starting at age 5. Hepatitis C blood test. Hepatitis B blood test. Sexually transmitted disease (STD) testing. Diabetes screening. This is done by checking your blood sugar (glucose) after you have not eaten for a while (fasting). You may have this done every 1-3 years. Bone density scan. This is done to screen for osteoporosis. You may have this done starting at age 35. Mammogram. This may be done every 1-2 years. Talk to your health care provider about how often you should have regular mammograms. Talk with your health care provider about your test results, treatment options, and if necessary, the need for more tests. Vaccines  Your health care provider may recommend certain vaccines, such  as: Influenza vaccine. This is recommended every year. Tetanus, diphtheria, and acellular pertussis (Tdap, Td) vaccine. You may  need a Td booster every 10 years. Zoster vaccine. You may need this after age 81. Pneumococcal 13-valent conjugate (PCV13) vaccine. One dose is recommended after age 30. Pneumococcal polysaccharide (PPSV23) vaccine. One dose is recommended after age 42. Talk to your health care provider about which screenings and vaccines you need and how often you need them. This information is not intended to replace advice given to you by your health care provider. Make sure you discuss any questions you have with your health care provider. Document Released: 11/01/2015 Document Revised: 06/24/2016 Document Reviewed: 08/06/2015 Elsevier Interactive Patient Education  2017 [place] Prevention in the Home Falls can cause injuries. They can happen to people of all ages. There are many things you can do to make your home safe and to help prevent falls. What can I do on the outside of my home? Regularly fix the edges of walkways and driveways and fix any cracks. Remove anything that might make you trip as you walk through a door, such as a raised step or threshold. Trim any bushes or trees on the path to your home. Use bright outdoor lighting. Clear any walking paths of anything that might make someone trip, such as rocks or tools. Regularly check to see if handrails are loose or broken. Make sure that both sides of any steps have handrails. Any raised decks and porches should have guardrails on the edges. Have any leaves, snow, or ice cleared regularly. Use sand or salt on walking paths during winter. Clean up any spills in your garage right away. This includes oil or grease spills. What can I do in the bathroom? Use night lights. Install grab bars by the toilet and in the tub and shower. Do not use towel bars as grab bars. Use non-skid mats or decals in the tub or shower. If you need to sit down in the shower, use a plastic, non-slip stool. Keep the floor dry. Clean up any water that spills on  the floor as soon as it happens. Remove soap buildup in the tub or shower regularly. Attach bath mats securely with double-sided non-slip rug tape. Do not have throw rugs and other things on the floor that can make you trip. What can I do in the bedroom? Use night lights. Make sure that you have a light by your bed that is easy to reach. Do not use any sheets or blankets that are too big for your bed. They should not hang down onto the floor. Have a firm chair that has side arms. You can use this for support while you get dressed. Do not have throw rugs and other things on the floor that can make you trip. What can I do in the kitchen? Clean up any spills right away. Avoid walking on wet floors. Keep items that you use a lot in easy-to-reach places. If you need to reach something above you, use a strong step stool that has a grab bar. Keep electrical cords out of the way. Do not use floor polish or wax that makes floors slippery. If you must use wax, use non-skid floor wax. Do not have throw rugs and other things on the floor that can make you trip. What can I do with my stairs? Do not leave any items on the stairs. Make sure that there are handrails on both sides of the stairs and use  them. Fix handrails that are broken or loose. Make sure that handrails are as long as the stairways. Check any carpeting to make sure that it is firmly attached to the stairs. Fix any carpet that is loose or worn. Avoid having throw rugs at the top or bottom of the stairs. If you do have throw rugs, attach them to the floor with carpet tape. Make sure that you have a light switch at the top of the stairs and the bottom of the stairs. If you do not have them, ask someone to add them for you. What else can I do to help prevent falls? Wear shoes that: Do not have high heels. Have rubber bottoms. Are comfortable and fit you well. Are closed at the toe. Do not wear sandals. If you use a stepladder: Make sure  that it is fully opened. Do not climb a closed stepladder. Make sure that both sides of the stepladder are locked into place. Ask someone to hold it for you, if possible. Clearly mark and make sure that you can see: Any grab bars or handrails. First and last steps. Where the edge of each step is. Use tools that help you move around (mobility aids) if they are needed. These include: Canes. Walkers. Scooters. Crutches. Turn on the lights when you go into a dark area. Replace any light bulbs as soon as they burn out. Set up your furniture so you have a clear path. Avoid moving your furniture around. If any of your floors are uneven, fix them. If there are any pets around you, be aware of where they are. Review your medicines with your doctor. Some medicines can make you feel dizzy. This can increase your chance of falling. Ask your doctor what other things that you can do to help prevent falls. This information is not intended to replace advice given to you by your health care provider. Make sure you discuss any questions you have with your health care provider. Document Released: 08/01/2009 Document Revised: 03/12/2016 Document Reviewed: 11/09/2014 Elsevier Interactive Patient Education  2017 [employer].

## 2021-12-02 ENCOUNTER — Other Ambulatory Visit: Payer: Self-pay | Admitting: Primary Care

## 2021-12-02 DIAGNOSIS — E038 Other specified hypothyroidism: Secondary | ICD-10-CM

## 2021-12-02 NOTE — Telephone Encounter (Signed)
Patient is due for her general follow-up visit with me in late February/early March 2023. Please have her scheduled for that timeframe.

## 2021-12-05 NOTE — Telephone Encounter (Signed)
Joellen, please update the care team for both she and her husband (our patient too).

## 2021-12-05 NOTE — Telephone Encounter (Signed)
Called pt couldn't leave a message because mail box was full

## 2021-12-11 NOTE — Telephone Encounter (Signed)
Information updated in both charts.

## 2021-12-13 ENCOUNTER — Other Ambulatory Visit: Payer: Self-pay | Admitting: Primary Care

## 2021-12-13 DIAGNOSIS — F32A Depression, unspecified: Secondary | ICD-10-CM

## 2021-12-13 DIAGNOSIS — G4700 Insomnia, unspecified: Secondary | ICD-10-CM

## 2021-12-13 DIAGNOSIS — F419 Anxiety disorder, unspecified: Secondary | ICD-10-CM

## 2021-12-14 NOTE — Telephone Encounter (Signed)
Patient is overdue for follow up with me. Needs to be scheduled as well as her husband who is a patient of mine.  Let me know when she's been scheduled.

## 2021-12-15 NOTE — Telephone Encounter (Signed)
Support pool: Patient is overdue for follow up with me. Needs to be scheduled as well as her husband who is a patient of mine.   Let me know when she's been scheduled.   Please schedule pt and husband and send this to Anda [facility]raft to do refill once scheduled.

## 2021-12-15 NOTE — Telephone Encounter (Signed)
Grace Ortiz and her husband are both scheduled on March 9th.

## 2021-12-17 ENCOUNTER — Other Ambulatory Visit: Payer: Self-pay | Admitting: Primary Care

## 2021-12-17 DIAGNOSIS — N183 Chronic kidney disease, stage 3 unspecified: Secondary | ICD-10-CM

## 2021-12-19 ENCOUNTER — Other Ambulatory Visit: Payer: Self-pay | Admitting: Primary Care

## 2021-12-19 DIAGNOSIS — G8929 Other chronic pain: Secondary | ICD-10-CM

## 2021-12-19 DIAGNOSIS — M5442 Lumbago with sciatica, left side: Secondary | ICD-10-CM

## 2021-12-19 DIAGNOSIS — G2581 Restless legs syndrome: Secondary | ICD-10-CM

## 2021-12-25 ENCOUNTER — Other Ambulatory Visit: Payer: Self-pay

## 2021-12-25 ENCOUNTER — Encounter: Payer: Self-pay | Admitting: Primary Care

## 2021-12-25 ENCOUNTER — Ambulatory Visit (INDEPENDENT_AMBULATORY_CARE_PROVIDER_SITE_OTHER): Payer: Medicare Other | Admitting: Primary Care

## 2021-12-25 VITALS — BP 124/62 | HR 58 | Temp 98.6°F | Ht 59.0 in | Wt 158.0 lb

## 2021-12-25 DIAGNOSIS — G4700 Insomnia, unspecified: Secondary | ICD-10-CM

## 2021-12-25 DIAGNOSIS — R519 Headache, unspecified: Secondary | ICD-10-CM | POA: Diagnosis not present

## 2021-12-25 DIAGNOSIS — K219 Gastro-esophageal reflux disease without esophagitis: Secondary | ICD-10-CM | POA: Diagnosis not present

## 2021-12-25 DIAGNOSIS — E038 Other specified hypothyroidism: Secondary | ICD-10-CM

## 2021-12-25 DIAGNOSIS — G8929 Other chronic pain: Secondary | ICD-10-CM

## 2021-12-25 DIAGNOSIS — E785 Hyperlipidemia, unspecified: Secondary | ICD-10-CM

## 2021-12-25 DIAGNOSIS — N183 Chronic kidney disease, stage 3 unspecified: Secondary | ICD-10-CM | POA: Diagnosis not present

## 2021-12-25 DIAGNOSIS — E2839 Other primary ovarian failure: Secondary | ICD-10-CM

## 2021-12-25 DIAGNOSIS — F330 Major depressive disorder, recurrent, mild: Secondary | ICD-10-CM

## 2021-12-25 DIAGNOSIS — R911 Solitary pulmonary nodule: Secondary | ICD-10-CM

## 2021-12-25 DIAGNOSIS — M5442 Lumbago with sciatica, left side: Secondary | ICD-10-CM

## 2021-12-25 LAB — COMPREHENSIVE METABOLIC PANEL
ALT: 14 U/L (ref 0–35)
AST: 17 U/L (ref 0–37)
Albumin: 4 g/dL (ref 3.5–5.2)
Alkaline Phosphatase: 61 U/L (ref 39–117)
BUN: 14 mg/dL (ref 6–23)
CO2: 32 mEq/L (ref 19–32)
Calcium: 10.5 mg/dL (ref 8.4–10.5)
Chloride: 104 mEq/L (ref 96–112)
Creatinine, Ser: 1.08 mg/dL (ref 0.40–1.20)
GFR: 50.22 mL/min — ABNORMAL LOW (ref >60.00)
Glucose, Bld: 73 mg/dL (ref 70–99)
Potassium: 4.2 mEq/L (ref 3.5–5.1)
Sodium: 141 mEq/L (ref 135–145)
Total Bilirubin: 0.4 mg/dL (ref 0.2–1.2)
Total Protein: 6.1 g/dL (ref 6.0–8.3)

## 2021-12-25 LAB — LIPID PANEL
Cholesterol: 136 mg/dL (ref 0–200)
HDL: 70.6 mg/dL (ref >39.00)
LDL Cholesterol: 47 mg/dL (ref 0–99)
NonHDL: 65.58
Total CHOL/HDL Ratio: 2
Triglycerides: 95 mg/dL (ref 0.0–149.0)
VLDL: 19 mg/dL (ref 0.0–40.0)

## 2021-12-25 LAB — CBC
HCT: 38.9 % (ref 36.0–46.0)
Hemoglobin: 13.1 g/dL (ref 12.0–15.0)
MCHC: 33.6 g/dL (ref 30.0–36.0)
MCV: 95.7 fl (ref 78.0–100.0)
Platelets: 175 10*3/uL (ref 150.0–400.0)
RBC: 4.06 Mil/uL (ref 3.87–5.11)
RDW: 13.8 % (ref 11.5–15.5)
WBC: 5.8 10*3/uL (ref 4.0–10.5)

## 2021-12-25 LAB — TSH: TSH: 0.22 u[IU]/mL — ABNORMAL LOW (ref 0.35–5.50)

## 2021-12-25 NOTE — Assessment & Plan Note (Signed)
Overall infrequent, no concerns today. ? ?Continue Compazine 10 mg as needed for severe nausea that occurs with headaches.  She uses very sparingly. ?

## 2021-12-25 NOTE — Assessment & Plan Note (Signed)
CT chest from October 2022 reviewed, due for repeat CT chest in October 2023. ?

## 2021-12-25 NOTE — Assessment & Plan Note (Signed)
Repeat lipid panel pending. ? ?Continue rosuvastatin 20 g daily ?

## 2021-12-25 NOTE — Assessment & Plan Note (Addendum)
Stable. ? ?Encouraged to increase activity level/exercise. ?Continue tizanidine 4 mg PRN.  ?She uses sparingly. ?

## 2021-12-25 NOTE — Assessment & Plan Note (Signed)
Long discussion today regarding her depression. ? ?I offered to switch her citalopram to Cymbalta, she currently declines and would like to continue her current regimen. ? ?Continue bupropion XL 300 mg daily, citalopram 40 mg daily, trazodone 50 mg at bedtime. ? ?She will notify if she changes her mind. ?

## 2021-12-25 NOTE — Patient Instructions (Signed)
Stop by the lab prior to leaving today. I will notify you of your results once received.   [facility].   It was a pleasure to see you today!   

## 2021-12-25 NOTE — Assessment & Plan Note (Signed)
Controlled. ? ?Continue trazodone 50 mg at bedtime. ?

## 2021-12-25 NOTE — Assessment & Plan Note (Signed)
She is taking levothyroxine correctly. ? ?Continue levothyroxine 50 mcg once weekly and levothyroxine 75 mcg six days weekly.  ? ?Repeat TSH pending. ?

## 2021-12-25 NOTE — Assessment & Plan Note (Signed)
Controlled.   Continue omeprazole 20 mg daily. 

## 2021-12-25 NOTE — Progress Notes (Signed)
Subjective:    Patient ID: Grace Ortiz, female    DOB: 1945-11-08, 76 y.o.   MRN: [id number]  HPI  Grace Ortiz is a very pleasant 76 y.o. female with a history of GERD, hypothyroidism, chronic back pain, CKD, hyperlipidemia, depression, insomnia, frequent headaches who presents today for follow-up of chronic conditions.  1) Hypothyroidism: Currently managed on levothyroxine 50 mcg one day weekly and 75 mcg six days weekly. She is taking levothyroxine every morning on an empty stomach with water only.   She is due for repeat TSH today.  2) MDD/Insomnia: Currently managed on bupropion XL 300 mg daily, citalopram 40 mg daily, Trazodone 50 mg nightly. She has symptoms of depression including feeling down, this occurs infrequently which lasts for a few weeks at a time. She spends most of her day sitting and watching TV with her husband.   She was once managed on Zoloft, cannot take due to side effects of hypertension. She was once managed on Prozac years ago, stopped working after a while. She's never been on Cymbalta.   Also managed on diazepam 5 mg for which she uses sparingly as needed for restless legs.   3) Essential Hypertension: Currently managed on losartan 25 mg daily. She denies chest pain, headaches, dizziness. Following with cardiology.   4) Chronic Back Pain: Stable, waxes and wanes.  She leads a sedentary lifestyle, mostly stays in her home.  She does take walks at times.  Managed on tizanidine 4 mg for which she uses sparingly as needed for pain.  She will mostly take Tylenol if needed.  BP Readings from Last 3 Encounters:  12/25/21 124/62  10/03/21 120/64  02/05/21 124/78     Immunizations: -Tetanus: 2017 -Influenza: Completed the season -Covid-19: 4 vaccines -Shingles: Completed Zostavax and Shingrix -Pneumonia: Prevnar 13 in 2015, Pneumovax 23 in 2017  Mammogram: Completed in September 2022 Dexa: Completed in 2019 Colonoscopy: Completed in 2021, due  2024  BP Readings from Last 3 Encounters:  12/25/21 124/62  10/03/21 120/64  02/05/21 124/78       Review of Systems  Respiratory:  Negative for shortness of breath.   Cardiovascular:  Negative for chest pain.  Musculoskeletal:  Positive for arthralgias and back pain.  Neurological:  Negative for dizziness and headaches.  Psychiatric/Behavioral:         See HPI        Past Medical History:  Diagnosis Date   Acute UTI 08/02/2018   Arthritis    Bell's palsy    Cervical polyp    Diverticulitis    Frequent headaches    GERD (gastroesophageal reflux disease)    Hyperlipidemia    Hypothyroidism    Macular retinal puckering, bilateral    Migraines     Social History   Socioeconomic History   Marital status: Married    Spouse name: Not on file   Number of children: Not on file   Years of education: Not on file   Highest education level: Not on file  Occupational History   Not on file  Tobacco Use   Smoking status: Former    Packs/day: 1.00    Years: 6.00    Pack years: 6.00    Types: Cigarettes    Quit date: 10/19/1970    Years since quitting: 51.2   Smokeless tobacco: Never  Vaping Use   Vaping Use: Never used  Substance and Sexual Activity   Alcohol use: Yes    Comment: rarely   Drug use: No  Sexual activity: Not on file  Other Topics Concern   Not on file  Social History Narrative   Married.   No children.   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    Moved from [place].   Social Determinants of Health   Financial Resource Strain: Low Risk    Difficulty of Paying Living Expenses: Not hard at all  Food Insecurity: No Food Insecurity   Worried About Charity fundraiser in the Last Year: Never true   [place] in the Last Year: Never true  Transportation Needs: No Transportation Needs   Lack of Transportation (Medical): No   Lack of Transportation (Non-Medical): No  Physical Activity: Insufficiently Active   Days of Exercise per  Week: 3 days   Minutes of Exercise per Session: 30 min  Stress: No Stress Concern Present   Feeling of Stress : Not at all  Social Connections: Moderately Isolated   Frequency of Communication with Friends and Family: Twice a week   Frequency of Social Gatherings with Friends and Family: Three times a week   Attends Religious Services: Never   Active Member of Clubs or Organizations: No   Attends Archivist Meetings: Never   Marital Status: Married  Human resources officer Violence: Not At Risk   Fear of Current or Ex-Partner: No   Emotionally Abused: No   Physically Abused: No   Sexually Abused: No    Past Surgical History:  Procedure Laterality Date   BLEPHAROPLASTY  2008, 2009   BREAST EXCISIONAL BIOPSY Left 05/1993   neg   BREAST EXCISIONAL BIOPSY Left 09/1993   neg   CATARACT EXTRACTION  2008   CERVICAL POLYPECTOMY  2014   COLONOSCOPY     COLONOSCOPY WITH PROPOFOL N/A 12/25/2019   Procedure: COLONOSCOPY WITH PROPOFOL;  Surgeon: Jonathon Bellows, MD;  Location: [facility];  Service: Gastroenterology;  Laterality: N/A;   DILATION AND CURETTAGE OF UTERUS     HYSTEROSCOPY  2014   UPPER ENDOSCOPY W/ ANTRODUODENAL MANOMETRY      Family History  Problem Relation Age of Onset   Breast cancer Maternal Grandmother 60   Cancer Maternal Grandmother        breast   Early death Maternal Grandmother    Hearing loss Mother    Heart disease Mother    Cancer Father        colon   Hearing loss Father    Diabetes Sister    Early death Paternal Grandfather     No Known Allergies  Current Outpatient Medications on File Prior to Visit  Medication Sig Dispense Refill   Ascorbic Acid (VITAMIN C PO) Take 1,000 Units by mouth daily.     aspirin EC 81 MG tablet Take 81 mg by mouth daily.     buPROPion (WELLBUTRIN XL) 300 MG 24 hr tablet TAKE ONE TABLET BY MOUTH DAILY FOR ANXIETY  AND DEPRESSON 90 tablet 0   Cholecalciferol (VITAMIN D3 PO) Take 1,200 mg by mouth daily.      citalopram (CELEXA) 40 MG tablet TAKE ONE TABLET BY MOUTH DAILY FOR ANXIETY OR DEPRESSION 90 tablet 0   Cyanocobalamin (VITAMIN B12 PO) Take 2,500 mcg by mouth daily.     cycloSPORINE (RESTASIS) 0.05 % ophthalmic emulsion      diazepam (VALIUM) 5 MG tablet TAKE ONE TABLET BY MOUTH EVERY NIGHT AT BEDTIME AS NEEDED FOR RESTLESS LEGS 10 tablet 0   levothyroxine (SYNTHROID) 50 MCG tablet TAKE 1 TABLET BY on Sunday  ON EMPTY STOMACH WITH WATER, NO FOOD OR OTHER MEDS FOR 30 MIN 12 tablet 2   levothyroxine (SYNTHROID) 75 MCG tablet Take 1 tablet by mouth every morning on an empty stomach with water only.  No food or other medications for 30 minutes. Office visit required in March for further refills. 72 tablet 0   losartan (COZAAR) 25 MG tablet TAKE 1 TABLET (25 MG TOTAL) BY MOUTH DAILY. FOR KIDNEY PROTECTION. 90 tablet 0   Omega-3 Fatty Acids (FISH OIL PO) Take 360 mg by mouth daily.     omeprazole (PRILOSEC) 20 MG capsule Take 1 capsule (20 mg total) by mouth daily. For heartburn. 90 capsule 3   polyethylene glycol (MIRALAX / GLYCOLAX) packet Take 17 g by mouth daily.      prochlorperazine (COMPAZINE) 10 MG tablet Take 1 tablet (10 mg total) by mouth every 8 (eight) hours as needed for nausea or vomiting. 30 tablet 0   rosuvastatin (CRESTOR) 20 MG tablet TAKE ONE TABLET BY MOUTH DAILY 90 tablet 3   tiZANidine (ZANAFLEX) 4 MG tablet TAKE ONE TABLET BY MOUTH EVERY 8 HOURS AS NEEDED FOR MUSCLE SPASMS 30 tablet 0   traZODone (DESYREL) 50 MG tablet TAKE ONE TABLET BY MOUTH AT BEDTIME AS NEEDED FOR SLEEP 90 tablet 0   No current facility-administered medications on file prior to visit.    BP 124/62    Pulse (!) 58    Temp 98.6 F (37 C) (Temporal)    Ht '4\' 11"'$  (1.499 m)    Wt 158 lb (71.7 kg)    SpO2 95%    BMI 31.91 kg/m  Objective:   Physical Exam Cardiovascular:     Rate and Rhythm: Normal rate and regular rhythm.  Pulmonary:     Effort: Pulmonary effort is normal.     Breath sounds: Normal breath  sounds.  Musculoskeletal:     Cervical back: Neck supple.  Skin:    General: Skin is warm and dry.  Psychiatric:        Mood and Affect: Mood normal.          Assessment & Plan:      This visit occurred during the SARS-CoV-2 public health emergency.  Safety protocols were in place, including screening questions prior to the visit, additional usage of staff PPE, and extensive cleaning of exam room while observing appropriate contact time as indicated for disinfecting solutions.

## 2021-12-25 NOTE — Assessment & Plan Note (Signed)
Repeat renal function pending. ? ?Avoid NSAIDs. ?Continue hydration with water ?

## 2021-12-26 MED ORDER — LEVOTHYROXINE SODIUM 50 MCG PO TABS: ORAL_TABLET | ORAL | 3 refills | Status: DC

## 2021-12-26 MED ORDER — LEVOTHYROXINE SODIUM 75 MCG PO TABS: ORAL_TABLET | ORAL | 3 refills | Status: DC

## 2022-02-26 ENCOUNTER — Encounter: Payer: Self-pay | Admitting: Primary Care

## 2022-02-26 ENCOUNTER — Ambulatory Visit (INDEPENDENT_AMBULATORY_CARE_PROVIDER_SITE_OTHER)
Admission: RE | Admit: 2022-02-26 | Discharge: 2022-02-26 | Disposition: A | Discharge status: Home / Self Care | Payer: Medicare Other | Source: Ambulatory Visit | Attending: Primary Care | Admitting: Primary Care

## 2022-02-26 ENCOUNTER — Ambulatory Visit (INDEPENDENT_AMBULATORY_CARE_PROVIDER_SITE_OTHER): Payer: Medicare Other | Admitting: Primary Care

## 2022-02-26 VITALS — BP 118/74 | HR 68 | Temp 98.5°F | Ht 59.0 in | Wt 149.0 lb

## 2022-02-26 DIAGNOSIS — U099 Post covid-19 condition, unspecified: Secondary | ICD-10-CM | POA: Insufficient documentation

## 2022-02-26 DIAGNOSIS — R059 Cough, unspecified: Secondary | ICD-10-CM | POA: Diagnosis not present

## 2022-02-26 DIAGNOSIS — R051 Acute cough: Secondary | ICD-10-CM | POA: Diagnosis not present

## 2022-02-26 DIAGNOSIS — U071 COVID-19: Secondary | ICD-10-CM | POA: Diagnosis not present

## 2022-02-26 HISTORY — DX: Post covid-19 condition, unspecified: U09.9

## 2022-02-26 NOTE — Assessment & Plan Note (Signed)
Patient never formally tested, however husband was positive for COVID-19. ? ?Checking chest x-ray today given severity of cough noted. ? ?Fortunately she is feeling better.  Continue over testing as needed. ?

## 2022-02-26 NOTE — Patient Instructions (Signed)
Complete xray(s) prior to leaving today. I will notify you of your results once received. ? ?Continue Robitussin as needed for cough. ? ?It was a pleasure to see you today! ? ?

## 2022-02-26 NOTE — Progress Notes (Signed)
? ?Subjective:  ? ? Patient ID: Grace Ortiz, female    DOB: September 12, 1946, 76 y.o.   MRN: [id number] ? ?HPI ? ?Donnice Nielsen is a very pleasant 76 y.o. female with a history of pulmonary nodule, CKD, hyperlipidemia, recent COVID-19 infection who presents today for follow-up post COVID infection. ? ?Symptom onset 02/11/22, husband tested positive for Covid-19 infection that day. Symptoms included fever, fatigue, cough, congestion. She was never treated with antiviral regimen.  ? ?She's been taking Robitussin as needed with improvement. She continues to experience a deep cough, fatigue, post nasal drip. She denies fevers now, shortness of breath, chest pain, chest congestion.  ? ?Today she's feeling slightly better as her cough as decreased and her energy has improved. She's had 4 total Covid-19 vaccines.  ? ?Review of Systems  ?Constitutional:  Positive for fatigue. Negative for chills and fever.  ?HENT:  Positive for congestion and postnasal drip.   ?Respiratory:  Positive for cough. Negative for shortness of breath.   ?Hematological:  Negative for adenopathy.  ? ?   ? ? ?Past Medical History:  ?Diagnosis Date  ? Acute UTI 08/02/2018  ? Arthritis   ? Bell's palsy   ? Cervical polyp   ? Diverticulitis   ? Frequent headaches   ? GERD (gastroesophageal reflux disease)   ? Hyperlipidemia   ? Hypothyroidism   ? Macular retinal puckering, bilateral   ? Migraines   ? ? ?Social History  ? ?Socioeconomic History  ? Marital status: Married  ?  Spouse name: Not on file  ? Number of children: Not on file  ? Years of education: Not on file  ? Highest education level: Not on file  ?Occupational History  ? Not on file  ?Tobacco Use  ? Smoking status: Former  ?  Packs/day: 1.00  ?  Years: 6.00  ?  Pack years: 6.00  ?  Types: Cigarettes  ?  Quit date: 10/19/1970  ?  Years since quitting: 51.3  ? Smokeless tobacco: Never  ?Vaping Use  ? Vaping Use: Never used  ?Substance and Sexual Activity  ? Alcohol use: Yes  ?  Comment: rarely  ? Drug  use: No  ? Sexual activity: Not on file  ?Other Topics Concern  ? Not on file  ?Social History Narrative  ? Married.  ? No children.  ? 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   ? Moved from [place].  ? ?Social Determinants of Health  ? ?Financial Resource Strain: Low Risk   ? Difficulty of Paying Living Expenses: Not hard at all  ?Food Insecurity: No Food Insecurity  ? Worried About Charity fundraiser in the Last Year: Never true  ? Ran Out of Food in the Last Year: Never true  ?Transportation Needs: No Transportation Needs  ? Lack of Transportation (Medical): No  ? Lack of Transportation (Non-Medical): No  ?Physical Activity: Insufficiently Active  ? Days of Exercise per Week: 3 days  ? Minutes of Exercise per Session: 30 min  ?Stress: No Stress Concern Present  ? Feeling of Stress : Not at all  ?Social Connections: Moderately Isolated  ? Frequency of Communication with Friends and Family: Twice a week  ? Frequency of Social Gatherings with Friends and Family: Three times a week  ? Attends Religious Services: Never  ? Active Member of Clubs or Organizations: No  ? Attends Archivist Meetings: Never  ? Marital Status: Married  ?Intimate Partner Violence: Not At Risk  ? Fear  of Current or Ex-Partner: No  ? Emotionally Abused: No  ? Physically Abused: No  ? Sexually Abused: No  ? ? ?Past Surgical History:  ?Procedure Laterality Date  ? BLEPHAROPLASTY  2008, 2009  ? BREAST EXCISIONAL BIOPSY Left 05/1993  ? neg  ? BREAST EXCISIONAL BIOPSY Left 09/1993  ? neg  ? CATARACT EXTRACTION  2008  ? CERVICAL POLYPECTOMY  2014  ? COLONOSCOPY    ? COLONOSCOPY WITH PROPOFOL N/A 12/25/2019  ? Procedure: COLONOSCOPY WITH PROPOFOL;  Surgeon: Jonathon Bellows, MD;  Location[facility] END[facility]COPY;  Service: Gastroenterology;  Laterality: N/A;  ? DILATION AND CURETTAGE OF UTERUS    ? HYSTEROSCOPY  2014  ? UPPER ENDOSCOPY W/ ANTRODUODENAL MANOMETRY    ? ? ?Family History  ?Problem Relation Age of Onset  ? Breast cancer Maternal  Grandmother 30  ? Cancer Maternal Grandmother   ?     breast  ? Early death Maternal Grandmother   ? Hearing loss Mother   ? Heart disease Mother   ? Cancer Father   ?     colon  ? Hearing loss Father   ? Diabetes Sister   ? Early death Paternal Grandfather   ? ? ?No Known Allergies ? ?Current Outpatient Medications on File Prior to Visit  ?Medication Sig Dispense Refill  ? Ascorbic Acid (VITAMIN C PO) Take 1,000 Units by mouth daily.    ? aspirin EC 81 MG tablet Take 81 mg by mouth daily.    ? buPROPion (WELLBUTRIN XL) 300 MG 24 hr tablet TAKE ONE TABLET BY MOUTH DAILY FOR ANXIETY  AND DEPRESSON 90 tablet 0  ? Cholecalciferol (VITAMIN D3 PO) Take 1,200 mg by mouth daily.    ? citalopram (CELEXA) 40 MG tablet TAKE ONE TABLET BY MOUTH DAILY FOR ANXIETY OR DEPRESSION 90 tablet 0  ? Cyanocobalamin (VITAMIN B12 PO) Take 2,500 mcg by mouth daily.    ? cycloSPORINE (RESTASIS) 0.05 % ophthalmic emulsion     ? diazepam (VALIUM) 5 MG tablet TAKE ONE TABLET BY MOUTH EVERY NIGHT AT BEDTIME AS NEEDED FOR RESTLESS LEGS 10 tablet 0  ? levothyroxine (SYNTHROID) 50 MCG tablet TAKE 1 TABLET BY on Sunday and Monday ON EMPTY STOMACH WITH WATER, NO FOOD OR OTHER MEDS FOR 30 MIN. 24 tablet 3  ? levothyroxine (SYNTHROID) 75 MCG tablet Take 1 tablet by mouth every morning on an empty stomach with water only Tuesday through Saturday.  No food or other medications for 30 minutes. . 60 tablet 3  ? losartan (COZAAR) 25 MG tablet TAKE 1 TABLET (25 MG TOTAL) BY MOUTH DAILY. FOR KIDNEY PROTECTION. 90 tablet 0  ? Omega-3 Fatty Acids (FISH OIL PO) Take 360 mg by mouth daily.    ? omeprazole (PRILOSEC) 20 MG capsule Take 1 capsule (20 mg total) by mouth daily. For heartburn. 90 capsule 3  ? polyethylene glycol (MIRALAX / GLYCOLAX) packet Take 17 g by mouth daily.     ? prochlorperazine (COMPAZINE) 10 MG tablet Take 1 tablet (10 mg total) by mouth every 8 (eight) hours as needed for nausea or vomiting. 30 tablet 0  ? rosuvastatin (CRESTOR) 20 MG  tablet TAKE ONE TABLET BY MOUTH DAILY 90 tablet 3  ? tiZANidine (ZANAFLEX) 4 MG tablet TAKE ONE TABLET BY MOUTH EVERY 8 HOURS AS NEEDED FOR MUSCLE SPASMS 30 tablet 0  ? traZODone (DESYREL) 50 MG tablet TAKE ONE TABLET BY MOUTH AT BEDTIME AS NEEDED FOR SLEEP 90 tablet 0  ? ?No current facility-administered  medications on file prior to visit.  ? ? ?BP 118/74   Pulse 68   Temp 98.5 ?F (36.9 ?C) (Oral)   Ht '4\' 11"'$  (1.499 m)   Wt 149 lb (67.6 kg)   SpO2 98%   BMI 30.09 kg/m?  ?Objective:  ? Physical Exam ?HENT:  ?   Right Ear: Tympanic membrane and ear canal normal.  ?   Left Ear: Tympanic membrane and ear canal normal.  ?   Nose:  ?   Right Sinus: No maxillary sinus tenderness or frontal sinus tenderness.  ?   Left Sinus: No maxillary sinus tenderness or frontal sinus tenderness.  ?   Mouth/Throat:  ?   Pharynx: No posterior oropharyngeal erythema.  ?Eyes:  ?   Conjunctiva/sclera: Conjunctivae normal.  ?Cardiovascular:  ?   Rate and Rhythm: Normal rate and regular rhythm.  ?Pulmonary:  ?   Effort: Pulmonary effort is normal.  ?   Breath sounds: Normal breath sounds. No wheezing or rales.  ?   Comments: Congested cough noted twice during visit ?Musculoskeletal:  ?   Cervical back: Neck supple.  ?Lymphadenopathy:  ?   Cervical: No cervical adenopathy.  ?Skin: ?   General: Skin is warm and dry.  ? ? ? ? ? ?   ?Assessment & Plan:  ? ? ? ? ?This visit occurred during the SARS-CoV-2 public health emergency.  Safety protocols were in place, including screening questions prior to the visit, additional usage of staff PPE, and extensive cleaning of exam room while observing appropriate contact time as indicated for disinfecting solutions.  ?

## 2022-03-09 ENCOUNTER — Other Ambulatory Visit: Payer: Self-pay | Admitting: Primary Care

## 2022-03-09 DIAGNOSIS — K219 Gastro-esophageal reflux disease without esophagitis: Secondary | ICD-10-CM

## 2022-03-09 DIAGNOSIS — G4700 Insomnia, unspecified: Secondary | ICD-10-CM

## 2022-03-09 DIAGNOSIS — F32A Depression, unspecified: Secondary | ICD-10-CM

## 2022-03-09 DIAGNOSIS — N183 Chronic kidney disease, stage 3 unspecified: Secondary | ICD-10-CM

## 2022-03-10 ENCOUNTER — Ambulatory Visit: Payer: Medicare Other | Admitting: Primary Care

## 2022-04-08 DIAGNOSIS — Z961 Presence of intraocular lens: Secondary | ICD-10-CM | POA: Diagnosis not present

## 2022-05-25 ENCOUNTER — Other Ambulatory Visit: Payer: Self-pay | Admitting: Primary Care

## 2022-05-25 DIAGNOSIS — Z1231 Encounter for screening mammogram for malignant neoplasm of breast: Secondary | ICD-10-CM

## 2022-06-30 ENCOUNTER — Ambulatory Visit: Payer: Medicare Other | Admitting: Primary Care

## 2022-07-06 ENCOUNTER — Ambulatory Visit
Admission: RE | Admit: 2022-07-06 | Discharge: 2022-07-06 | Disposition: A | Discharge status: Home / Self Care | Payer: Medicare Other | Source: Ambulatory Visit | Attending: Primary Care | Admitting: Primary Care

## 2022-07-06 DIAGNOSIS — Z1231 Encounter for screening mammogram for malignant neoplasm of breast: Secondary | ICD-10-CM | POA: Insufficient documentation

## 2022-07-07 ENCOUNTER — Ambulatory Visit (INDEPENDENT_AMBULATORY_CARE_PROVIDER_SITE_OTHER): Payer: Medicare Other | Admitting: Primary Care

## 2022-07-07 ENCOUNTER — Encounter: Payer: Self-pay | Admitting: Primary Care

## 2022-07-07 VITALS — BP 110/74 | HR 59 | Temp 99.9°F | Ht 59.0 in | Wt 154.0 lb

## 2022-07-07 DIAGNOSIS — F419 Anxiety disorder, unspecified: Secondary | ICD-10-CM | POA: Diagnosis not present

## 2022-07-07 DIAGNOSIS — F32A Depression, unspecified: Secondary | ICD-10-CM | POA: Diagnosis not present

## 2022-07-07 DIAGNOSIS — F330 Major depressive disorder, recurrent, mild: Secondary | ICD-10-CM | POA: Diagnosis not present

## 2022-07-07 DIAGNOSIS — G8929 Other chronic pain: Secondary | ICD-10-CM | POA: Diagnosis not present

## 2022-07-07 DIAGNOSIS — R911 Solitary pulmonary nodule: Secondary | ICD-10-CM | POA: Diagnosis not present

## 2022-07-07 DIAGNOSIS — G2581 Restless legs syndrome: Secondary | ICD-10-CM | POA: Diagnosis not present

## 2022-07-07 DIAGNOSIS — E038 Other specified hypothyroidism: Secondary | ICD-10-CM | POA: Diagnosis not present

## 2022-07-07 DIAGNOSIS — Z23 Encounter for immunization: Secondary | ICD-10-CM

## 2022-07-07 DIAGNOSIS — M5442 Lumbago with sciatica, left side: Secondary | ICD-10-CM | POA: Diagnosis not present

## 2022-07-07 MED ORDER — GABAPENTIN 100 MG PO CAPS
100.0000 mg | ORAL_CAPSULE | Freq: Every day | ORAL | 0 refills | Status: DC
Start: 2022-07-07 — End: 2022-08-17

## 2022-07-07 MED ORDER — DULOXETINE HCL 30 MG PO CPEP: 30.0000 mg | ORAL_CAPSULE | Freq: Every day | ORAL | 0 refills | Status: DC

## 2022-07-07 NOTE — Assessment & Plan Note (Signed)
Chronic.  Discouraged regular use of diazepam. Discontinue diazepam 10 mg PRN.  Start gabapentin 100 mg HS. She will update.

## 2022-07-07 NOTE — Assessment & Plan Note (Signed)
Deteriorated.   Discussed options for treatment.  She declines therapy.  Stop citalopram 40 mg Start duloxetine 30 mg daily. Continue bupropion XL 300 mg daily.  She will update in 4 weeks.

## 2022-07-07 NOTE — Assessment & Plan Note (Signed)
Overdue for repeat TSH since dose adjustment in March 2023.  Continue levothyroxine 50 mcg two days weekly and 75 mcg five days weekly. Repeat TSH pending.   She is taking levothyroxine correctly

## 2022-07-07 NOTE — Assessment & Plan Note (Signed)
Ongoing.  Will be switching from citalopram 40 mg to duloxetine 30 mg for depression. This may also help with chronic back pain. She will update.

## 2022-07-07 NOTE — Assessment & Plan Note (Signed)
Repeat CT chest due in October 2023, discussed with patient.

## 2022-07-07 NOTE — Progress Notes (Signed)
Subjective:    Patient ID: Grace Ortiz, female    DOB: 01/24/1946, 75 y.o.   MRN: [id number]  Cough Associated symptoms include postnasal drip. Pertinent negatives include no chest pain or shortness of breath. Her past medical history is significant for environmental allergies.    Grace Ortiz is a very pleasant 76 y.o. female with a history of hypothyroidism, CKD, hyperlipidemia, MDD, insomnia, chronic low back pain who presents today for follow-up and to discuss cough.  1) Hypothyroidism: Currently managed on levothyroxine 50 mcg two days weekly and levothyroxine 75 mcg five days weekly.  This regimen was created in March 2023 as her TSH level was 0.22.  At that time it was recommended we repeat her thyroid lab testing 2 months later (approximately early May 2023), however she did not have this testing done.  Today she endorses taking her levothyroxine regimen as recommended from March 2023.   2) Pulmonary Nodules: Chronic 1 cm nodule within the lingula dating back to 01/23/2020.  3 mm left upper lobe and lingular nodules present.  Last CT chest was in October 2022, no interval change in nodules.  Repeat CT chest was recommended for October 2023.   3) MDD: Currently managed on bupropion XL 300 mg daily, citalopram 40 mg daily, trazodone 50 mg HS. Overall she continues to feel depressed frequently. She is mostly sedentary, doesn't go out to do much except for the grocery store and doctors appointments. She would like to feel better overall.   4) Restless Legs: Chronic for years, intermittent. Historically managed on diazepam for which she took regularly HS for sleep. She has never tried gabapentin or other treatments for restless legs.    Review of Systems  HENT:  Positive for postnasal drip.   Respiratory:  Negative for shortness of breath.   Cardiovascular:  Negative for chest pain.  Allergic/Immunologic: Positive for environmental allergies.  Neurological:        Restless legs   Psychiatric/Behavioral:  Positive for sleep disturbance. The patient is nervous/anxious.        See HPI         Past Medical History:  Diagnosis Date   Acute UTI 08/02/2018   Arthritis    Bell's palsy    Cervical polyp    Diverticulitis    Frequent headaches    GERD (gastroesophageal reflux disease)    Hyperlipidemia    Hypothyroidism    Macular retinal puckering, bilateral    Migraines     Social History   Socioeconomic History   Marital status: Married    Spouse name: Not on file   Number of children: Not on file   Years of education: Not on file   Highest education level: Not on file  Occupational History   Not on file  Tobacco Use   Smoking status: Former    Packs/day: 1.00    Years: 6.00    Total pack years: 6.00    Types: Cigarettes    Quit date: 10/19/1970    Years since quitting: 51.7   Smokeless tobacco: Never  Vaping Use   Vaping Use: Never used  Substance and Sexual Activity   Alcohol use: Yes    Comment: rarely   Drug use: No   Sexual activity: Not on file  Other Topics Concern   Not on file  Social History Narrative   Married.   No children.   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    Moved from [place].   Social Determinants  of Health   Financial Resource Strain: Low Risk  (11/28/2021)   Overall Financial Resource Strain (CARDIA)    Difficulty of Paying Living Expenses: Not hard at all  Food Insecurity: No Food Insecurity (11/28/2021)   Hunger Vital Sign    Worried About Running Out of Food in the Last Year: Never true    Ran Out of Food in the Last Year: Never true  Transportation Needs: No Transportation Needs (11/28/2021)   PRAPARE - Hydrologist (Medical): No    Lack of Transportation (Non-Medical): No  Physical Activity: Insufficiently Active (11/28/2021)   Exercise Vital Sign    Days of Exercise per Week: 3 days    Minutes of Exercise per Session: 30 min  Stress: No Stress Concern Present  (11/28/2021)   [place]    Feeling of Stress : Not at all  Social Connections: Moderately Isolated (11/28/2021)   Social Connection and Isolation Panel [NHANES]    Frequency of Communication with Friends and Family: Twice a week    Frequency of Social Gatherings with Friends and Family: Three times a week    Attends Religious Services: Never    Active Member of Clubs or Organizations: No    Attends Archivist Meetings: Never    Marital Status: Married  Human resources officer Violence: Not At Risk (11/28/2021)   Humiliation, Afraid, Rape, and Kick questionnaire    Fear of Current or Ex-Partner: No    Emotionally Abused: No    Physically Abused: No    Sexually Abused: No    Past Surgical History:  Procedure Laterality Date   BLEPHAROPLASTY  2008, 2009   BREAST EXCISIONAL BIOPSY Left 05/1993   neg   BREAST EXCISIONAL BIOPSY Left 09/1993   neg   CATARACT EXTRACTION  2008   CERVICAL POLYPECTOMY  2014   COLONOSCOPY     COLONOSCOPY WITH PROPOFOL N/A 12/25/2019   Procedure: COLONOSCOPY WITH PROPOFOL;  Surgeon: Jonathon Bellows, MD;  Location: [facility] ENDOSCOPY;  Service: Gastroenterology;  Laterality: N/A;   DILATION AND CURETTAGE OF UTERUS     HYSTEROSCOPY  2014   UPPER ENDOSCOPY W/ ANTRODUODENAL MANOMETRY      Family History  Problem Relation Age of Onset   Breast cancer Maternal Grandmother 38   Cancer Maternal Grandmother        breast   Early death Maternal Grandmother    Hearing loss Mother    Heart disease Mother    Cancer Father        colon   Hearing loss Father    Diabetes Sister    Early death Paternal Grandfather     No Known Allergies  Current Outpatient Medications on File Prior to Visit  Medication Sig Dispense Refill   Ascorbic Acid (VITAMIN C PO) Take 1,000 Units by mouth daily.     aspirin EC 81 MG tablet Take 81 mg by mouth daily.     buPROPion (WELLBUTRIN XL) 300 MG 24 hr tablet TAKE ONE  TABLET BY MOUTH DAILY FOR ANXIETY AND DEPRESSION 90 tablet 2   Cholecalciferol (VITAMIN D3 PO) Take 1,200 mg by mouth daily.     Cyanocobalamin (VITAMIN B12 PO) Take 2,500 mcg by mouth daily.     cycloSPORINE (RESTASIS) 0.05 % ophthalmic emulsion      levothyroxine (SYNTHROID) 50 MCG tablet TAKE 1 TABLET BY on Sunday and Monday ON EMPTY STOMACH WITH WATER, NO FOOD OR OTHER MEDS FOR 30 MIN.  24 tablet 3   levothyroxine (SYNTHROID) 75 MCG tablet Take 1 tablet by mouth every morning on an empty stomach with water only Tuesday through Saturday.  No food or other medications for 30 minutes. . 60 tablet 3   losartan (COZAAR) 25 MG tablet TAKE ONE TABLET BY MOUTH DAILY FOR KIDNEY PROTECTION 90 tablet 2   Omega-3 Fatty Acids (FISH OIL PO) Take 360 mg by mouth daily.     omeprazole (PRILOSEC) 20 MG capsule TAKE 1 CAPSULE BY MOUTH DAILY. FOR HEARTBURN. 90 capsule 2   polyethylene glycol (MIRALAX / GLYCOLAX) packet Take 17 g by mouth daily.      prochlorperazine (COMPAZINE) 10 MG tablet Take 1 tablet (10 mg total) by mouth every 8 (eight) hours as needed for nausea or vomiting. 30 tablet 0   rosuvastatin (CRESTOR) 20 MG tablet TAKE ONE TABLET BY MOUTH DAILY 90 tablet 3   tiZANidine (ZANAFLEX) 4 MG tablet TAKE ONE TABLET BY MOUTH EVERY 8 HOURS AS NEEDED FOR MUSCLE SPASMS 30 tablet 0   traZODone (DESYREL) 50 MG tablet TAKE ONE TABLET BY MOUTH EVERY NIGHT AT BEDTIME AS NEEDED FOR SLEEP 90 tablet 2   No current facility-administered medications on file prior to visit.    BP 110/74   Pulse (!) 59   Temp 99.9 F (37.7 C)   Ht '4\' 11"'$  (1.499 m)   Wt 154 lb (69.9 kg)   SpO2 98%   BMI 31.10 kg/m  Objective:   Physical Exam Cardiovascular:     Rate and Rhythm: Normal rate and regular rhythm.  Pulmonary:     Effort: Pulmonary effort is normal.     Breath sounds: Normal breath sounds.  Musculoskeletal:     Cervical back: Neck supple.  Skin:    General: Skin is warm and dry.  Neurological:     Mental  Status: She is alert and oriented to person, place, and time.  Psychiatric:        Mood and Affect: Mood normal.           Assessment & Plan:   Problem List Items Addressed This Visit       Respiratory   Pulmonary nodule    Repeat CT chest due in October 2023, discussed with patient.         Endocrine   Hypothyroidism    Overdue for repeat TSH since dose adjustment in March 2023.  Continue levothyroxine 50 mcg two days weekly and 75 mcg five days weekly. Repeat TSH pending.   She is taking levothyroxine correctly      Relevant Orders   TSH     Nervous and Auditory   Chronic left-sided low back pain with left-sided sciatica    Ongoing.  Will be switching from citalopram 40 mg to duloxetine 30 mg for depression. This may also help with chronic back pain. She will update.      Relevant Medications   DULoxetine (CYMBALTA) 30 MG capsule   gabapentin (NEURONTIN) 100 MG capsule     Other   MDD (major depressive disorder)    Deteriorated.   Discussed options for treatment.  She declines therapy.  Stop citalopram 40 mg Start duloxetine 30 mg daily. Continue bupropion XL 300 mg daily.  She will update in 4 weeks.      Relevant Medications   DULoxetine (CYMBALTA) 30 MG capsule   Restless legs    Chronic.  Discouraged regular use of diazepam. Discontinue diazepam 10 mg PRN.  Start gabapentin 100  mg HS. She will update.       Relevant Medications   gabapentin (NEURONTIN) 100 MG capsule   Other Visit Diagnoses     Anxiety and depression    -  Primary   Relevant Medications   DULoxetine (CYMBALTA) 30 MG capsule          Pleas Koch, NP

## 2022-07-07 NOTE — Patient Instructions (Signed)
Stop taking citalopram for depression.  Start taking duloxetine (Cymbalta) 30 mg daily for depression. Continue bupropion XL 300 mg daily.  You may take gabapentin 100 mg at bedtime for restless legs. You can take 1 or 2 capsules at bedtime.  Stop by the lab prior to leaving today. I will notify you of your results once received.   You are due for you repeat CT chest in late October 2023.  It was a pleasure to see you today!

## 2022-07-08 LAB — TSH: TSH: 0.59 u[IU]/mL (ref 0.35–5.50)

## 2022-07-21 DIAGNOSIS — D225 Melanocytic nevi of trunk: Secondary | ICD-10-CM | POA: Diagnosis not present

## 2022-07-21 DIAGNOSIS — D2271 Melanocytic nevi of right lower limb, including hip: Secondary | ICD-10-CM | POA: Diagnosis not present

## 2022-07-21 DIAGNOSIS — L820 Inflamed seborrheic keratosis: Secondary | ICD-10-CM | POA: Diagnosis not present

## 2022-07-21 DIAGNOSIS — L538 Other specified erythematous conditions: Secondary | ICD-10-CM | POA: Diagnosis not present

## 2022-07-21 DIAGNOSIS — D2261 Melanocytic nevi of right upper limb, including shoulder: Secondary | ICD-10-CM | POA: Diagnosis not present

## 2022-07-21 DIAGNOSIS — L821 Other seborrheic keratosis: Secondary | ICD-10-CM | POA: Diagnosis not present

## 2022-07-21 DIAGNOSIS — D2262 Melanocytic nevi of left upper limb, including shoulder: Secondary | ICD-10-CM | POA: Diagnosis not present

## 2022-07-21 DIAGNOSIS — D2272 Melanocytic nevi of left lower limb, including hip: Secondary | ICD-10-CM | POA: Diagnosis not present

## 2022-07-21 DIAGNOSIS — L298 Other pruritus: Secondary | ICD-10-CM | POA: Diagnosis not present

## 2022-07-27 DIAGNOSIS — R911 Solitary pulmonary nodule: Secondary | ICD-10-CM

## 2022-07-27 DIAGNOSIS — E038 Other specified hypothyroidism: Secondary | ICD-10-CM

## 2022-07-28 MED ORDER — LEVOTHYROXINE SODIUM 50 MCG PO TABS: ORAL_TABLET | ORAL | 1 refills | Status: DC

## 2022-07-29 ENCOUNTER — Encounter: Payer: Self-pay | Admitting: *Deleted

## 2022-08-13 ENCOUNTER — Ambulatory Visit
Admission: RE | Admit: 2022-08-13 | Discharge: 2022-08-13 | Disposition: A | Discharge status: Home / Self Care | Payer: Medicare Other | Source: Ambulatory Visit | Attending: Primary Care | Admitting: Primary Care

## 2022-08-13 DIAGNOSIS — R911 Solitary pulmonary nodule: Secondary | ICD-10-CM | POA: Diagnosis not present

## 2022-08-16 DIAGNOSIS — G2581 Restless legs syndrome: Secondary | ICD-10-CM

## 2022-08-17 MED ORDER — GABAPENTIN 100 MG PO CAPS: 100.0000 mg | ORAL_CAPSULE | Freq: Every day | ORAL | 1 refills | Status: DC

## 2022-08-17 NOTE — Telephone Encounter (Signed)
Can you change her quit smoking year to 1972? Same for our patient (her husband) Grace Ortiz.

## 2022-08-18 NOTE — Telephone Encounter (Signed)
Per chart review, history reflects 1972 quit date for patient, no changes made. I have changed patients husband, Karissa Meenan smoking quite date to reflect 1972.

## 2022-08-19 ENCOUNTER — Other Ambulatory Visit: Payer: Self-pay | Admitting: Primary Care

## 2022-08-19 DIAGNOSIS — G2581 Restless legs syndrome: Secondary | ICD-10-CM

## 2022-09-01 ENCOUNTER — Ambulatory Visit
Admission: RE | Admit: 2022-09-01 | Discharge: 2022-09-01 | Disposition: A | Discharge status: Home / Self Care | Payer: Medicare Other | Source: Ambulatory Visit | Attending: Primary Care | Admitting: Primary Care

## 2022-09-01 DIAGNOSIS — E2839 Other primary ovarian failure: Secondary | ICD-10-CM | POA: Diagnosis not present

## 2022-09-01 DIAGNOSIS — Z780 Asymptomatic menopausal state: Secondary | ICD-10-CM | POA: Diagnosis not present

## 2022-09-01 DIAGNOSIS — M8589 Other specified disorders of bone density and structure, multiple sites: Secondary | ICD-10-CM | POA: Diagnosis not present

## 2022-09-14 ENCOUNTER — Other Ambulatory Visit: Payer: Self-pay

## 2022-09-14 MED ORDER — ROSUVASTATIN CALCIUM 20 MG PO TABS: 20.0000 mg | ORAL_TABLET | Freq: Every day | ORAL | 0 refills | Status: DC

## 2022-09-22 NOTE — Telephone Encounter (Signed)
Grace Ortiz, is there a way to burn her a disc of her CT chest from October 2023? She is requesting the images and I wasn't sure how to go about that.

## 2022-09-25 ENCOUNTER — Other Ambulatory Visit: Payer: Self-pay | Admitting: Primary Care

## 2022-09-25 DIAGNOSIS — F419 Anxiety disorder, unspecified: Secondary | ICD-10-CM

## 2022-09-25 MED ORDER — DULOXETINE HCL 30 MG PO CPEP: 30.0000 mg | ORAL_CAPSULE | Freq: Every day | ORAL | 0 refills | Status: DC

## 2022-09-25 NOTE — Telephone Encounter (Signed)
From: Carmon Ginsberg To: Office of Pleas Koch, NP Sent: 09/25/2022 12:55 PM EST Subject: Medication Renewal Request  Refills have been requested for the following medications:   DULoxetine (CYMBALTA) 30 MG capsule [Aeson Sawyers K Dynasia Kercheval]  Preferred pharmacy: [id number] - [address], [address] Delivery method: [employer]

## 2022-10-03 ENCOUNTER — Other Ambulatory Visit: Payer: Self-pay | Admitting: Primary Care

## 2022-10-03 DIAGNOSIS — F419 Anxiety disorder, unspecified: Secondary | ICD-10-CM

## 2022-10-15 ENCOUNTER — Other Ambulatory Visit: Payer: Self-pay

## 2022-10-15 ENCOUNTER — Telehealth: Payer: Self-pay

## 2022-10-15 DIAGNOSIS — Z86010 Personal history of colonic polyps: Secondary | ICD-10-CM

## 2022-10-15 MED ORDER — NA SULFATE-K SULFATE-MG SULF 17.5-3.13-1.6 GM/177ML PO SOLN: 1.0000 | Freq: Once | ORAL | 0 refills | Status: AC

## 2022-10-15 NOTE — Telephone Encounter (Signed)
Gastroenterology Pre-Procedure Review  Request Date: 01/04/23 Requesting Physician: Dr. Vicente Males  PATIENT REVIEW QUESTIONS: The patient responded to the following health history questions as indicated:    1. Are you having any GI issues? no 2. Do you have a personal history of Polyps? yes (last colonoscopy performed by Dr. Vicente Males on 12/25/19 polyps were present) 3. Do you have a family history of Colon Cancer or Polyps? yes (father colon cancer ) 4. Diabetes Mellitus? no 5. Joint replacements in the past 12 months?no 6. Major health problems in the past 3 months?no 7. Any artificial heart valves, MVP, or defibrillator?no    MEDICATIONS & ALLERGIES:    Patient reports the following regarding taking any anticoagulation/antiplatelet therapy:   Plavix, Coumadin, Eliquis, Xarelto, Lovenox, Pradaxa, Brilinta, or Effient? no Aspirin? no  Patient confirms/reports the following medications:  Current Outpatient Medications  Medication Sig Dispense Refill   Ascorbic Acid (VITAMIN C PO) Take 1,000 Units by mouth daily.     aspirin EC 81 MG tablet Take 81 mg by mouth daily.     buPROPion (WELLBUTRIN XL) 300 MG 24 hr tablet TAKE ONE TABLET BY MOUTH DAILY FOR ANXIETY AND DEPRESSION 90 tablet 2   Cholecalciferol (VITAMIN D3 PO) Take 1,200 mg by mouth daily.     Cyanocobalamin (VITAMIN B12 PO) Take 2,500 mcg by mouth daily.     cycloSPORINE (RESTASIS) 0.05 % ophthalmic emulsion      DULoxetine (CYMBALTA) 30 MG capsule Take 1 capsule (30 mg total) by mouth daily. for anxiety and depression. 90 capsule 0   gabapentin (NEURONTIN) 100 MG capsule Take 1-2 capsules (100-200 mg total) by mouth at bedtime. For restless legs. 180 capsule 1   levothyroxine (SYNTHROID) 50 MCG tablet TAKE 1 TABLET BY on Sunday and Monday ON EMPTY STOMACH WITH WATER, NO FOOD OR OTHER MEDS FOR 30 MIN. 32 tablet 1   levothyroxine (SYNTHROID) 75 MCG tablet Take 1 tablet by mouth every morning on an empty stomach with water only Tuesday  through Saturday.  No food or other medications for 30 minutes. . 60 tablet 3   losartan (COZAAR) 25 MG tablet TAKE ONE TABLET BY MOUTH DAILY FOR KIDNEY PROTECTION 90 tablet 2   Omega-3 Fatty Acids (FISH OIL PO) Take 360 mg by mouth daily.     omeprazole (PRILOSEC) 20 MG capsule TAKE 1 CAPSULE BY MOUTH DAILY. FOR HEARTBURN. 90 capsule 2   polyethylene glycol (MIRALAX / GLYCOLAX) packet Take 17 g by mouth daily.      prochlorperazine (COMPAZINE) 10 MG tablet Take 1 tablet (10 mg total) by mouth every 8 (eight) hours as needed for nausea or vomiting. 30 tablet 0   rosuvastatin (CRESTOR) 20 MG tablet Take 1 tablet (20 mg total) by mouth daily. Please call [phone] to schedule an appointment for further refills. 30 tablet 0   tiZANidine (ZANAFLEX) 4 MG tablet TAKE ONE TABLET BY MOUTH EVERY 8 HOURS AS NEEDED FOR MUSCLE SPASMS 30 tablet 0   traZODone (DESYREL) 50 MG tablet TAKE ONE TABLET BY MOUTH EVERY NIGHT AT BEDTIME AS NEEDED FOR SLEEP 90 tablet 2   No current facility-administered medications for this visit.    Patient confirms/reports the following allergies:  No Known Allergies  No orders of the defined types were placed in this encounter.   AUTHORIZATION INFORMATION Primary Insurance: 1D#: Group #:  Secondary Insurance: 1D#: Group #:  SCHEDULE INFORMATION: Date: 01/04/23 Time: Location: [facility]

## 2022-10-20 ENCOUNTER — Other Ambulatory Visit: Payer: Self-pay | Admitting: Cardiovascular Disease

## 2022-10-20 NOTE — Telephone Encounter (Signed)
Please contact pt for future appointment. Pt overdue for f/u. Pt needing refills. 

## 2022-11-04 ENCOUNTER — Other Ambulatory Visit: Payer: Self-pay | Admitting: Primary Care

## 2022-11-04 DIAGNOSIS — G4700 Insomnia, unspecified: Secondary | ICD-10-CM

## 2022-11-05 NOTE — Telephone Encounter (Signed)
LVM for patient to call back and scheduled. Sent MyChart message.

## 2022-11-05 NOTE — Telephone Encounter (Signed)
Patient is due for follow up in March or April. Please schedule, thank you!

## 2022-11-16 ENCOUNTER — Telehealth: Payer: Self-pay | Admitting: Cardiovascular Disease

## 2022-11-16 ENCOUNTER — Encounter: Payer: Self-pay | Admitting: Cardiovascular Disease

## 2022-11-16 ENCOUNTER — Other Ambulatory Visit: Payer: Self-pay | Admitting: Cardiovascular Disease

## 2022-11-16 MED ORDER — ROSUVASTATIN CALCIUM 20 MG PO TABS: ORAL_TABLET | ORAL | 0 refills | Status: DC

## 2022-11-16 NOTE — Telephone Encounter (Signed)
Error

## 2022-11-16 NOTE — Telephone Encounter (Signed)
*  STAT* If patient is at the pharmacy, call can be transferred to refill team.   1. Which medications need to be refilled? (please list name of each medication and dose if known)   rosuvastatin (CRESTOR) 20 MG tablet   2. Which pharmacy/location (including street and city if local pharmacy) is medication to be sent to?  [id number] - [address], [place]    3. Do they need a 30 day or 90 day supply? 90 day  Patient stated she still has some of this medication.

## 2022-11-16 NOTE — Addendum Note (Signed)
Addended by: Nestor Ramp on: 11/16/2022 03:00 PM   Modules accepted: Orders

## 2022-11-16 NOTE — Telephone Encounter (Signed)
Please schedule F/U appointment for further refills. Thank you! 

## 2022-11-17 NOTE — Telephone Encounter (Signed)
Pt is scheduled on 12/01/2022

## 2022-11-25 ENCOUNTER — Telehealth: Payer: Medicare Other | Admitting: Primary Care

## 2022-11-30 ENCOUNTER — Ambulatory Visit (INDEPENDENT_AMBULATORY_CARE_PROVIDER_SITE_OTHER): Payer: Medicare Other

## 2022-11-30 VITALS — Ht 59.0 in | Wt 151.0 lb

## 2022-11-30 DIAGNOSIS — Z0000 Encounter for general adult medical examination without abnormal findings: Secondary | ICD-10-CM | POA: Diagnosis not present

## 2022-11-30 NOTE — Patient Instructions (Signed)
Grace Ortiz , Thank you for taking time to come for your Medicare Wellness Visit. I appreciate your ongoing commitment to your health goals. Please review the following plan we discussed and let me know if I can assist you in the future.   These are the goals we discussed:  Goals      Patient Stated     Starting 10/31/2018, I will continue to take medications as prescribed.      Patient Stated     11/02/2019, I will maintain and continue medications as prescribed.      Patient Stated     11/27/2020, I will maintain and continue medications as prescribed.      Patient Stated     Would like to walk more and get into an exercise routine     Patient Stated     Would like to exercise more.        This is a list of the screening recommended for you and due dates:  Health Maintenance  Topic Date Due   COVID-19 Vaccine (5 - 2023-24 season) 06/19/2022   Colon Cancer Screening  12/25/2022   Mammogram  07/07/2023   Medicare Annual Wellness Visit  12/01/2023   DTaP/Tdap/Td vaccine (3 - Td or Tdap) 06/01/2026   Pneumonia Vaccine  Completed   Flu Shot  Completed   DEXA scan (bone density measurement)  Completed   Hepatitis C Screening: USPSTF Recommendation to screen - Ages 38-79 yo.  Completed   Zoster (Shingles) Vaccine  Completed   HPV Vaccine  Aged Out    Advanced directives: yes, on chart.  Conditions/risks identified: none  Next appointment: Follow up in one year for your annual wellness visit 12/02/23 @ 10:00 via telephone.   Preventive Care 77 Years and Older, Female Preventive care refers to lifestyle choices and visits with your health care provider that can promote health and wellness. What does preventive care include? A yearly physical exam. This is also called an annual well check. Dental exams once or twice a year. Routine eye exams. Ask your health care provider how often you should have your eyes checked. Personal lifestyle choices, including: Daily care of your  teeth and gums. Regular physical activity. Eating a healthy diet. Avoiding tobacco and drug use. Limiting alcohol use. Practicing safe sex. Taking low-dose aspirin every day. Taking vitamin and mineral supplements as recommended by your health care provider. What happens during an annual well check? The services and screenings done by your health care provider during your annual well check will depend on your age, overall health, lifestyle risk factors, and family history of disease. Counseling  Your health care provider may ask you questions about your: Alcohol use. Tobacco use. Drug use. Emotional well-being. Home and relationship well-being. Sexual activity. Eating habits. History of falls. Memory and ability to understand (cognition). Work and work Statistician. Reproductive health. Screening  You may have the following tests or measurements: Height, weight, and BMI. Blood pressure. Lipid and cholesterol levels. These may be checked every 5 years, or more frequently if you are over 38 years old. Skin check. Lung cancer screening. You may have this screening every year starting at age 46 if you have a 30-pack-year history of smoking and currently smoke or have quit within the past 15 years. Fecal occult blood test (FOBT) of the stool. You may have this test every year starting at age 22. Flexible sigmoidoscopy or colonoscopy. You may have a sigmoidoscopy every 5 years or a colonoscopy every 10  years starting at age 1. Hepatitis C blood test. Hepatitis B blood test. Sexually transmitted disease (STD) testing. Diabetes screening. This is done by checking your blood sugar (glucose) after you have not eaten for a while (fasting). You may have this done every 1-3 years. Bone density scan. This is done to screen for osteoporosis. You may have this done starting at age 27. Mammogram. This may be done every 1-2 years. Talk to your health care provider about how often you should have  regular mammograms. Talk with your health care provider about your test results, treatment options, and if necessary, the need for more tests. Vaccines  Your health care provider may recommend certain vaccines, such as: Influenza vaccine. This is recommended every year. Tetanus, diphtheria, and acellular pertussis (Tdap, Td) vaccine. You may need a Td booster every 10 years. Zoster vaccine. You may need this after age 11. Pneumococcal 13-valent conjugate (PCV13) vaccine. One dose is recommended after age 46. Pneumococcal polysaccharide (PPSV23) vaccine. One dose is recommended after age 46. Talk to your health care provider about which screenings and vaccines you need and how often you need them. This information is not intended to replace advice given to you by your health care provider. Make sure you discuss any questions you have with your health care provider. Document Released: 11/01/2015 Document Revised: 06/24/2016 Document Reviewed: 08/06/2015 Elsevier Interactive Patient Education  2017 [employer] Prevention in the Home Falls can cause injuries. They can happen to people of all ages. There are many things you can do to make your home safe and to help prevent falls. What can I do on the outside of my home? Regularly fix the edges of walkways and driveways and fix any cracks. Remove anything that might make you trip as you walk through a door, such as a raised step or threshold. Trim any bushes or trees on the path to your home. Use bright outdoor lighting. Clear any walking paths of anything that might make someone trip, such as rocks or tools. Regularly check to see if handrails are loose or broken. Make sure that both sides of any steps have handrails. Any raised decks and porches should have guardrails on the edges. Have any leaves, snow, or ice cleared regularly. Use sand or salt on walking paths during winter. Clean up any spills in your garage right away. This  includes oil or grease spills. What can I do in the bathroom? Use night lights. Install grab bars by the toilet and in the tub and shower. Do not use towel bars as grab bars. Use non-skid mats or decals in the tub or shower. If you need to sit down in the shower, use a plastic, non-slip stool. Keep the floor dry. Clean up any water that spills on the floor as soon as it happens. Remove soap buildup in the tub or shower regularly. Attach bath mats securely with double-sided non-slip rug tape. Do not have throw rugs and other things on the floor that can make you trip. What can I do in the bedroom? Use night lights. Make sure that you have a light by your bed that is easy to reach. Do not use any sheets or blankets that are too big for your bed. They should not hang down onto the floor. Have a firm chair that has side arms. You can use this for support while you get dressed. Do not have throw rugs and other things on the floor that can make you trip.  What can I do in the kitchen? Clean up any spills right away. Avoid walking on wet floors. Keep items that you use a lot in easy-to-reach places. If you need to reach something above you, use a strong step stool that has a grab bar. Keep electrical cords out of the way. Do not use floor polish or wax that makes floors slippery. If you must use wax, use non-skid floor wax. Do not have throw rugs and other things on the floor that can make you trip. What can I do with my stairs? Do not leave any items on the stairs. Make sure that there are handrails on both sides of the stairs and use them. Fix handrails that are broken or loose. Make sure that handrails are as long as the stairways. Check any carpeting to make sure that it is firmly attached to the stairs. Fix any carpet that is loose or worn. Avoid having throw rugs at the top or bottom of the stairs. If you do have throw rugs, attach them to the floor with carpet tape. Make sure that you  have a light switch at the top of the stairs and the bottom of the stairs. If you do not have them, ask someone to add them for you. What else can I do to help prevent falls? Wear shoes that: Do not have high heels. Have rubber bottoms. Are comfortable and fit you well. Are closed at the toe. Do not wear sandals. If you use a stepladder: Make sure that it is fully opened. Do not climb a closed stepladder. Make sure that both sides of the stepladder are locked into place. Ask someone to hold it for you, if possible. Clearly mark and make sure that you can see: Any grab bars or handrails. First and last steps. Where the edge of each step is. Use tools that help you move around (mobility aids) if they are needed. These include: Canes. Walkers. Scooters. Crutches. Turn on the lights when you go into a dark area. Replace any light bulbs as soon as they burn out. Set up your furniture so you have a clear path. Avoid moving your furniture around. If any of your floors are uneven, fix them. If there are any pets around you, be aware of where they are. Review your medicines with your doctor. Some medicines can make you feel dizzy. This can increase your chance of falling. Ask your doctor what other things that you can do to help prevent falls. This information is not intended to replace advice given to you by your health care provider. Make sure you discuss any questions you have with your health care provider. Document Released: 08/01/2009 Document Revised: 03/12/2016 Document Reviewed: 11/09/2014 Elsevier Interactive Patient Education  2017 [employer].

## 2022-11-30 NOTE — Progress Notes (Signed)
I connected with  Grace Ortiz on 11/30/22 by a audio enabled telemedicine application and verified that I am speaking with the correct person using two identifiers.  Patient Location: Home  Provider Location: Office/Clinic  I discussed the limitations of evaluation and management by telemedicine. The patient expressed understanding and agreed to proceed.  Subjective:   Grace Ortiz is a 77 y.o. female who presents for Medicare Annual (Subsequent) preventive examination.  Review of Systems     Cardiac Risk Factors include: advanced age (>20mn, >>98women)     Objective:    Today's Vitals   11/30/22 1258  Weight: 151 lb (68.5 kg)  Height: 4' 11"$  (1.499 m)   Body mass index is 30.5 kg/m.     11/30/2022    1:13 PM 11/28/2021    2:42 PM 11/27/2020    3:25 PM 12/25/2019    8:02 AM 11/02/2019   10:45 AM 10/31/2018    1:20 PM 08/02/2018   11:42 PM  Advanced Directives  Does Patient Have a Medical Advance Directive? Yes Yes Yes Yes Yes Yes No  Type of AParamedicof ACloudcroftLiving will HWestmontLiving will HCowanLiving will HHarrisvilleLiving will HExportLiving will HProvencalLiving will   Does patient want to make changes to medical advance directive? No - Patient declined Yes (MAU/Ambulatory/Procedural Areas - Information given)       Copy of [name] Chart? Yes - validated most recent copy scanned in chart (See row information)  No - copy requested No - copy requested No - copy requested No - copy requested   Would patient like information on creating a medical advance directive?       No - Patient declined    Current Medications (verified) Outpatient Encounter Medications as of 11/30/2022  Medication Sig   Ascorbic Acid (VITAMIN C PO) Take 1,000 Units by mouth daily.   aspirin EC 81 MG tablet Take 81 mg by mouth daily.   buPROPion  (WELLBUTRIN XL) 300 MG 24 hr tablet TAKE ONE TABLET BY MOUTH DAILY FOR ANXIETY AND DEPRESSION   Cholecalciferol (VITAMIN D3 PO) Take 1,200 mg by mouth daily.   Cyanocobalamin (VITAMIN B12 PO) Take 2,500 mcg by mouth daily.   cycloSPORINE (RESTASIS) 0.05 % ophthalmic emulsion    DULoxetine (CYMBALTA) 30 MG capsule Take 1 capsule (30 mg total) by mouth daily. for anxiety and depression.   gabapentin (NEURONTIN) 100 MG capsule Take 1-2 capsules (100-200 mg total) by mouth at bedtime. For restless legs.   levothyroxine (SYNTHROID) 50 MCG tablet TAKE 1 TABLET BY on Sunday and Monday ON EMPTY STOMACH WITH WATER, NO FOOD OR OTHER MEDS FOR 30 MIN.   levothyroxine (SYNTHROID) 75 MCG tablet Take 1 tablet by mouth every morning on an empty stomach with water only Tuesday through Saturday.  No food or other medications for 30 minutes. .Marland Kitchen  losartan (COZAAR) 25 MG tablet TAKE ONE TABLET BY MOUTH DAILY FOR KIDNEY PROTECTION   Omega-3 Fatty Acids (FISH OIL PO) Take 360 mg by mouth daily.   omeprazole (PRILOSEC) 20 MG capsule TAKE 1 CAPSULE BY MOUTH DAILY. FOR HEARTBURN.   polyethylene glycol (MIRALAX / GLYCOLAX) packet Take 17 g by mouth daily.    prochlorperazine (COMPAZINE) 10 MG tablet Take 1 tablet (10 mg total) by mouth every 8 (eight) hours as needed for nausea or vomiting.   rosuvastatin (CRESTOR) 20 MG tablet Temporary refill only,  office visit needed for further refill.   tiZANidine (ZANAFLEX) 4 MG tablet TAKE ONE TABLET BY MOUTH EVERY 8 HOURS AS NEEDED FOR MUSCLE SPASMS   traZODone (DESYREL) 50 MG tablet TAKE ONE TABLET BY MOUTH EVERY NIGHT AT BEDTIME AS NEEDED FOR SLEEP   No facility-administered encounter medications on file as of 11/30/2022.    Allergies (verified) Patient has no known allergies.   History: Past Medical History:  Diagnosis Date   Acute UTI 08/02/2018   Arthritis    Bell's palsy    Cervical polyp    Diverticulitis    Frequent headaches    GERD (gastroesophageal reflux  disease)    Hyperlipidemia    Hypothyroidism    Macular retinal puckering, bilateral    Migraines    Past Surgical History:  Procedure Laterality Date   BLEPHAROPLASTY  2008, 2009   BREAST EXCISIONAL BIOPSY Left 05/1993   neg   BREAST EXCISIONAL BIOPSY Left 09/1993   neg   CATARACT EXTRACTION  2008   CERVICAL POLYPECTOMY  2014   COLONOSCOPY     COLONOSCOPY WITH PROPOFOL N/A 12/25/2019   Procedure: COLONOSCOPY WITH PROPOFOL;  Surgeon: Jonathon Bellows, MD;  Location: [facility] ENDOSCOPY;  Service: Gastroenterology;  Laterality: N/A;   DILATION AND CURETTAGE OF UTERUS     HYSTEROSCOPY  2014   UPPER ENDOSCOPY W/ ANTRODUODENAL MANOMETRY     Family History  Problem Relation Age of Onset   Breast cancer Maternal Grandmother 10   Cancer Maternal Grandmother        breast   Early death Maternal Grandmother    Hearing loss Mother    Heart disease Mother    Cancer Father        colon   Hearing loss Father    Diabetes Sister    Early death Paternal Grandfather    Social History   Socioeconomic History   Marital status: Married    Spouse name: Not on file   Number of children: Not on file   Years of education: Not on file   Highest education level: Not on file  Occupational History   Not on file  Tobacco Use   Smoking status: Former    Packs/day: 1.00    Years: 6.00    Total pack years: 6.00    Types: Cigarettes    Quit date: 10/19/1970    Years since quitting: 52.1   Smokeless tobacco: Never  Vaping Use   Vaping Use: Never used  Substance and Sexual Activity   Alcohol use: Yes    Comment: rarely   Drug use: No   Sexual activity: Not on file  Other Topics Concern   Not on file  Social History Narrative   Married.   No children.   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    Moved from [place].   Social Determinants of Health   Financial Resource Strain: Low Risk  (11/30/2022)   Overall Financial Resource Strain (CARDIA)    Difficulty of Paying Living Expenses:  Not hard at all  Food Insecurity: No Food Insecurity (11/30/2022)   Hunger Vital Sign    Worried About Running Out of Food in the Last Year: Never true    Ran Out of Food in the Last Year: Never true  Transportation Needs: No Transportation Needs (11/30/2022)   PRAPARE - Hydrologist (Medical): No    Lack of Transportation (Non-Medical): No  Physical Activity: Insufficiently Active (11/30/2022)   Exercise Vital Sign  Days of Exercise per Week: 3 days    Minutes of Exercise per Session: 20 min  Stress: Stress Concern Present (11/30/2022)   [place]    Feeling of Stress : To some extent  Social Connections: Moderately Isolated (11/30/2022)   Social Connection and Isolation Panel [NHANES]    Frequency of Communication with Friends and Family: Twice a week    Frequency of Social Gatherings with Friends and Family: Once a week    Attends Religious Services: Never    Marine scientist or Organizations: No    Attends Music therapist: Never    Marital Status: Married    Tobacco Counseling Counseling given: Not Answered   Clinical Intake:  Pre-visit preparation completed: Yes  Pain : No/denies pain     Nutritional Risks: None Diabetes: No  How often do you need to have someone help you when you read instructions, pamphlets, or other written materials from your doctor or pharmacy?: 1 - Never  Diabetic? no  Interpreter Needed?: No  Information entered by :: C Amerigo Mcglory LPN   Activities of Daily Living    11/30/2022    1:16 PM 11/26/2022    4:28 PM  In your present state of health, do you have any difficulty performing the following activities:  Hearing? 1 0  Comment Wears aids   Vision? 0 0  Comment Wears glasses   Difficulty concentrating or making decisions? 1 0  Comment occasionally   Walking or climbing stairs? 0 0  Dressing or bathing? 0 0  Doing  errands, shopping? 0 0  Preparing Food and eating ? N N  Using the Toilet? N N  In the past six months, have you accidently leaked urine? N N  Do you have problems with loss of bowel control? N N  Managing your Medications? N N  Managing your Finances? N N  Housekeeping or managing your Housekeeping? N N    Patient Care Team: Pleas Koch, NP as PCP - General (Internal Medicine) Dingeldein, Remo Lipps, MD (Ophthalmology)  Indicate any recent Medical Services you may have received from other than [facility] providers in the past year (date may be approximate).     Assessment:   This is a routine wellness examination for Grace Ortiz.  Hearing/Vision screen Hearing Screening - Comments:: Wears aids Vision Screening - Comments:: Wears Glasses- Dr.Dingleheim  Dietary issues and exercise activities discussed: Current Exercise Habits: The patient does not participate in regular exercise at present, Exercise limited by: Other - see comments;None identified   Goals Addressed             This Visit's Progress    Patient Stated       Would like to exercise more.       Depression Screen    11/30/2022    1:07 PM 07/07/2022    2:12 PM 12/25/2021   12:34 PM 11/28/2021    2:46 PM 11/27/2020    3:26 PM 11/02/2019   10:46 AM 10/31/2018    3:07 PM  PHQ 2/9 Scores  PHQ - 2 Score 1 5 2 $ 0 2 1 0  PHQ- 9 Score 3 5 3  2 1 $ 0    Fall Risk    11/30/2022    1:14 PM 11/26/2022    4:28 PM 11/28/2021    2:44 PM 11/27/2020    3:26 PM 11/02/2019   10:45 AM  Fall Risk   Falls in the past year?  1 1 0 0  Number falls in past yr: 1 1 0 0 0  Comment Tripped over stool once and lost balance with heavy bag.      Injury with Fall? 0 0 0 0 0  Risk for fall due to : Impaired balance/gait  Other (Comment) No Fall Risks Medication side effect  Risk for fall due to: Comment   lost balance    Follow up Falls evaluation completed;Falls prevention discussed;Education provided  Falls prevention discussed Falls evaluation  completed;Falls prevention discussed Falls evaluation completed;Falls prevention discussed    FALL RISK PREVENTION PERTAINING TO THE HOME:  Any stairs in or around the home? No  If so, are there any without handrails? No  Home free of loose throw rugs in walkways, pet beds, electrical cords, etc? Yes  Adequate lighting in your home to reduce risk of falls? Yes   ASSISTIVE DEVICES UTILIZED TO PREVENT FALLS:  Life alert? No  Use of a cane, walker or w/c? No  Grab bars in the bathroom? Yes  Shower chair or bench in shower? Yes  Elevated toilet seat or a handicapped toilet? No    Cognitive Function:    11/27/2020    3:28 PM 11/02/2019   10:48 AM 10/31/2018    3:09 PM 10/28/2017    1:36 PM  MMSE - Mini Mental State Exam  Orientation to time 5 5 5 5  $ Orientation to Place 5 5 5 5  $ Registration 3 3 3 3  $ Attention/ Calculation 5 5 0 0  Recall 3 3 3 3  $ Language- name 2 objects   0 0  Language- repeat 1 1 1 1  $ Language- follow 3 step command   3 3  Language- read & follow direction   0 0  Write a sentence   0 0  Copy design   0 0  Total score   20 20        11/30/2022    1:25 PM  6CIT Screen  What Year? 0 points  What month? 0 points  What time? 0 points  Count back from 20 0 points  Months in reverse 0 points  Repeat phrase 0 points  Total Score 0 points    Immunizations Immunization History  Administered Date(s) Administered   Fluad Quad(high Dose 65+) 07/07/2022   Influenza Split 08/06/2009   Influenza, High Dose Seasonal PF 08/31/2012, 07/26/2013, 08/01/2014, 07/31/2015, 08/05/2016, 07/29/2018, 06/09/2019, 06/25/2020, 07/04/2021   Influenza,inj,Quad PF,6+ Mos 06/30/2017   Influenza-Unspecified 07/29/2018, 06/09/2019   PFIZER Comirnaty(Gray Top)Covid-19 Tri-Sucrose Vaccine 04/02/2021   PFIZER(Purple Top)SARS-COV-2 Vaccination 01/15/2020, 02/07/2020, 08/29/2020   Pneumococcal Conjugate-13 06/30/2012, 10/25/2013   Pneumococcal Polysaccharide-23 09/07/2012, 07/27/2016    Tdap 10/29/2015, 06/01/2016   Zoster Recombinat (Shingrix) 11/10/2019, 01/08/2020   Zoster, Live 02/11/2011    TDAP status: Up to date  Flu Vaccine status: Up to date  Pneumococcal vaccine status: Up to date  Covid-19 vaccine status: Completed vaccines  Qualifies for Shingles Vaccine? No   Zostavax completed Yes   Shingrix Completed?: Yes  Screening Tests Health Maintenance  Topic Date Due   COVID-19 Vaccine (5 - 2023-24 season) 06/19/2022   COLONOSCOPY (Pts 45-76yr Insurance coverage will need to be confirmed)  12/25/2022   MAMMOGRAM  07/07/2023   Medicare Annual Wellness (AWV)  12/01/2023   DTaP/Tdap/Td (3 - Td or Tdap) 06/01/2026   Pneumonia Vaccine 77 Years old  Completed   INFLUENZA VACCINE  Completed   DEXA SCAN  Completed   Hepatitis C Screening  Completed   Zoster Vaccines- Shingrix  Completed   HPV VACCINES  Aged Out    Health Maintenance  Health Maintenance Due  Topic Date Due   COVID-19 Vaccine (5 - 2023-24 season) 06/19/2022    Colorectal cancer screening: Type of screening: Colonoscopy. Completed 12/25/2019. Repeat every 3 years already scheduled per pt.  Mammogram status: Completed 07/06/2022. Repeat every year. Pt will schedule.  Bone Density status: Completed 09/01/2022. Results reflect: Bone density results: OSTEOPENIA. Repeat every 2 years.  Lung Cancer Screening: (Low Dose CT Chest recommended if Age 77-80 years, 30 pack-year currently smoking OR have quit w/in 15years.) does not qualify.   Lung Cancer Screening Referral: none  Additional Screening:  Hepatitis C Screening: does not qualify; Completed 10/20/2015  Vision Screening: Recommended annual ophthalmology exams for early detection of glaucoma and other disorders of the eye. Is the patient up to date with their annual eye exam?  Yes  Who is the provider or what is the name of the office in which the patient attends annual eye exams? Dr.Dingledein If pt is not established with a  provider, would they like to be referred to a provider to establish care? No .   Dental Screening: Recommended annual dental exams for proper oral hygiene  Community Resource Referral / Chronic Care Management: CRR required this visit?  No   CCM required this visit?  No      Plan:     I have personally reviewed and noted the following in the patient's chart:   Medical and social history Use of alcohol, tobacco or illicit drugs  Current medications and supplements including opioid prescriptions. Patient is not currently taking opioid prescriptions. Functional ability and status Nutritional status Physical activity Advanced directives List of other physicians Hospitalizations, surgeries, and ER visits in previous 12 months Vitals Screenings to include cognitive, depression, and falls Referrals and appointments  In addition, I have reviewed and discussed with patient certain preventive protocols, quality metrics, and best practice recommendations. A written personalized care plan for preventive services as well as general preventive health recommendations were provided to patient.     Lebron Conners, LPN   [id number]   Nurse Notes: none

## 2022-11-30 NOTE — Progress Notes (Unsigned)
Cardiology Office Note  Date:  12/01/2022   ID:  Grace Ortiz, DOB [birth year], MRN [id number]  PCP:  Pleas Koch, NP   Chief Complaint  Patient presents with   12 month follow up     "Doing well." Medications reviewed by the patient verbally.     HPI:  [name] is a 77 year old woman with past medical history of Chronic fatigue Hyperlipidemia Moderate-sized hiatal hernia Right-sided aortic arch Minimal aortic atherosclerosis,  Mild to moderate proximal RCA calcification noted on CT Obesity Non-smoker, no diabetes Who presents for f/u of her coronary atherosclerosis on CT scan  LOV 12/22 In follow-up today reports that she feels well No regular exercise program Reports blood pressure relatively well-controlled Denies any significant chest pain or shortness of breath on exertion Weight stable  Tolerating Crestor  Lab work reviewed March 2023 Total cholesterol 136 LDL 47 Normal BMP, CBC  CT scan, reviewed right-sided aortic arch noted, unable to exclude compression on esophagus Minimal aortic atherosclerosis with 2 speckles noted Mild to moderate proximal RCA calcification noted  EKG personally reviewed by myself on todays visit Shows normal sinus rhythm rate 74 bpm no significant ST or T wave changes  PMH:   has a past medical history of Acute UTI (08/02/2018), Arthritis, Bell's palsy, Cervical polyp, Diverticulitis, Frequent headaches, GERD (gastroesophageal reflux disease), Hyperlipidemia, Hypothyroidism, Macular retinal puckering, bilateral, and Migraines.  PSH:    Past Surgical History:  Procedure Laterality Date   BLEPHAROPLASTY  2008, 2009   BREAST EXCISIONAL BIOPSY Left 05/1993   neg   BREAST EXCISIONAL BIOPSY Left 09/1993   neg   CATARACT EXTRACTION  2008   CERVICAL POLYPECTOMY  2014   COLONOSCOPY     COLONOSCOPY WITH PROPOFOL N/A 12/25/2019   Procedure: COLONOSCOPY WITH PROPOFOL;  Surgeon: Jonathon Bellows, MD;  Location: [facility] ENDOSCOPY;   Service: Gastroenterology;  Laterality: N/A;   DILATION AND CURETTAGE OF UTERUS     HYSTEROSCOPY  2014   UPPER ENDOSCOPY W/ ANTRODUODENAL MANOMETRY      Current Outpatient Medications  Medication Sig Dispense Refill   Ascorbic Acid (VITAMIN C PO) Take 1,000 Units by mouth daily.     aspirin EC 81 MG tablet Take 81 mg by mouth daily.     buPROPion (WELLBUTRIN XL) 300 MG 24 hr tablet TAKE ONE TABLET BY MOUTH DAILY FOR ANXIETY AND DEPRESSION 90 tablet 2   Cholecalciferol (VITAMIN D3 PO) Take 1,200 mg by mouth daily.     Cyanocobalamin (VITAMIN B12 PO) Take 2,500 mcg by mouth daily.     cycloSPORINE (RESTASIS) 0.05 % ophthalmic emulsion      DULoxetine (CYMBALTA) 30 MG capsule Take 1 capsule (30 mg total) by mouth daily. for anxiety and depression. 90 capsule 0   gabapentin (NEURONTIN) 100 MG capsule Take 1-2 capsules (100-200 mg total) by mouth at bedtime. For restless legs. 180 capsule 1   levothyroxine (SYNTHROID) 50 MCG tablet TAKE 1 TABLET BY on Sunday and Monday ON EMPTY STOMACH WITH WATER, NO FOOD OR OTHER MEDS FOR 30 MIN. 32 tablet 1   levothyroxine (SYNTHROID) 75 MCG tablet Take 1 tablet by mouth every morning on an empty stomach with water only Tuesday through Saturday.  No food or other medications for 30 minutes. . 60 tablet 3   losartan (COZAAR) 25 MG tablet TAKE ONE TABLET BY MOUTH DAILY FOR KIDNEY PROTECTION 90 tablet 2   Omega-3 Fatty Acids (FISH OIL PO) Take 360 mg by mouth daily.  omeprazole (PRILOSEC) 20 MG capsule TAKE 1 CAPSULE BY MOUTH DAILY. FOR HEARTBURN. 90 capsule 2   polyethylene glycol (MIRALAX / GLYCOLAX) packet Take 17 g by mouth daily.      prochlorperazine (COMPAZINE) 10 MG tablet Take 1 tablet (10 mg total) by mouth every 8 (eight) hours as needed for nausea or vomiting. 30 tablet 0   tiZANidine (ZANAFLEX) 4 MG tablet TAKE ONE TABLET BY MOUTH EVERY 8 HOURS AS NEEDED FOR MUSCLE SPASMS 30 tablet 0   traZODone (DESYREL) 50 MG tablet TAKE ONE TABLET BY MOUTH EVERY  NIGHT AT BEDTIME AS NEEDED FOR SLEEP 90 tablet 0   rosuvastatin (CRESTOR) 20 MG tablet Temporary refill only, office visit needed for further refill. 90 tablet 3   No current facility-administered medications for this visit.    Allergies:   Patient has no known allergies.   Social History:  The patient  reports that she quit smoking about 52 years ago. Her smoking use included cigarettes. She has a 6.00 pack-year smoking history. She has never used smokeless tobacco. She reports current alcohol use. She reports that she does not use drugs.   Family History:   family history includes Breast cancer (age of onset: 33) in her maternal grandmother; Cancer in her father and maternal grandmother; Diabetes in her sister; Early death in her maternal grandmother and paternal grandfather; Hearing loss in her father and mother; Heart disease in her mother.    Review of Systems: Review of Systems  Constitutional: Negative.   HENT: Negative.    Respiratory: Negative.    Cardiovascular: Negative.   Gastrointestinal: Negative.   Musculoskeletal: Negative.   Neurological: Negative.   Psychiatric/Behavioral: Negative.    All other systems reviewed and are negative.   PHYSICAL EXAM: VS:  BP 130/62 (BP Location: Left Arm, Patient Position: Sitting, Cuff Size: Normal)   Pulse 74   Ht 4' 11"$  (1.499 m)   Wt 155 lb 6 oz (70.5 kg)   SpO2 94%   BMI 31.38 kg/m  , BMI Body mass index is 31.38 kg/m. Constitutional:  oriented to person, place, and time. No distress.  HENT:  Head: Grossly normal Eyes:  no discharge. No scleral icterus.  Neck: No JVD, no carotid bruits  Cardiovascular: Regular rate and rhythm, no murmurs appreciated Pulmonary/Chest: Clear to auscultation bilaterally, no wheezes or rails Abdominal: Soft.  no distension.  no tenderness.  Musculoskeletal: Normal range of motion Neurological:  normal muscle tone. Coordination normal. No atrophy Skin: Skin warm and dry Psychiatric: normal  affect, pleasant  Recent Labs: 12/25/2021: ALT 14; BUN 14; Creatinine, Ser 1.08; Hemoglobin 13.1; Platelets 175.0; Potassium 4.2; Sodium 141 07/07/2022: TSH 0.59    Lipid Panel Lab Results  Component Value Date   CHOL 136 12/25/2021   HDL 70.60 12/25/2021   LDLCALC 47 12/25/2021   TRIG 95.0 12/25/2021      Wt Readings from Last 3 Encounters:  12/01/22 155 lb 6 oz (70.5 kg)  11/30/22 151 lb (68.5 kg)  07/07/22 154 lb (69.9 kg)     ASSESSMENT AND PLAN:  Problem List Items Addressed This Visit       Cardiology Problems   Hyperlipidemia   Relevant Medications   rosuvastatin (CRESTOR) 20 MG tablet   Other Relevant Orders   EKG 12-Lead     Other   Chronic kidney disease (CKD), stage III (moderate)   Other Visit Diagnoses     Aortic atherosclerosis ([place])    -  Primary   Relevant Medications  rosuvastatin (CRESTOR) 20 MG tablet   Other Relevant Orders   EKG 12-Lead   Coronary artery calcification seen on CAT scan       Relevant Medications   rosuvastatin (CRESTOR) 20 MG tablet   Other Relevant Orders   EKG 12-Lead   Chronic fatigue         Aortic atherosclerosis Minimal disease noted, 2 speckles on review of images Non-smoker, no diabetes, cholesterol at goal No further workup needed  Coronary calcification Mild to moderate in the proximal RCA,  Denies anginal symptoms LDL at goal on Crestor  Hyperlipidemia Cholesterol is at goal on the current lipid regimen. No changes to the medications were made.  Obesity Weight trending downward Recommend regular walking program  Hiatal hernia Moderate in size, some indigestion No significant symptoms on today's visit    Total encounter time more than 30 minutes  Greater than 50% was spent in counseling and coordination of care with the patient    Signed, Esmond Plants, M.D., Ph.D. [place], [place]

## 2022-12-01 ENCOUNTER — Ambulatory Visit: Payer: Medicare Other | Attending: Cardiovascular Disease | Admitting: Cardiovascular Disease

## 2022-12-01 ENCOUNTER — Encounter: Payer: Self-pay | Admitting: Cardiovascular Disease

## 2022-12-01 VITALS — BP 130/62 | HR 74 | Ht 59.0 in | Wt 155.4 lb

## 2022-12-01 DIAGNOSIS — I2510 Atherosclerotic heart disease of native coronary artery without angina pectoris: Secondary | ICD-10-CM | POA: Insufficient documentation

## 2022-12-01 DIAGNOSIS — R5382 Chronic fatigue, unspecified: Secondary | ICD-10-CM | POA: Diagnosis not present

## 2022-12-01 DIAGNOSIS — E782 Mixed hyperlipidemia: Secondary | ICD-10-CM | POA: Insufficient documentation

## 2022-12-01 DIAGNOSIS — N183 Chronic kidney disease, stage 3 unspecified: Secondary | ICD-10-CM | POA: Diagnosis not present

## 2022-12-01 DIAGNOSIS — I700 Atherosclerosis of aorta: Secondary | ICD-10-CM | POA: Insufficient documentation

## 2022-12-01 MED ORDER — ROSUVASTATIN CALCIUM 20 MG PO TABS: ORAL_TABLET | ORAL | 3 refills | Status: DC

## 2022-12-01 NOTE — Patient Instructions (Signed)
Medication Instructions:  No changes  If you need a refill on your cardiac medications before your next appointment, please call your pharmacy.   Lab work: No new labs needed  Testing/Procedures: No new testing needed  Follow-Up: At [facility], you and your health needs are our priority.  As part of our continuing mission to provide you with exceptional heart care, we have created designated Provider Care Teams.  These Care Teams include your primary Cardiologist (physician) and Advanced Practice Providers (APPs -  Physician Assistants and Nurse Practitioners) who all work together to provide you with the care you need, when you need it.  You will need a follow up appointment in 12 months  Providers on your designated Care Team:   Christopher Berge, NP Ryan Dunn, PA-C Cadence Furth, PA-C  COVID-19 Vaccine Information can be found at: [email or web address] For questions related to vaccine distribution or appointments, please email [email or web address] or call [phone].   

## 2022-12-19 ENCOUNTER — Other Ambulatory Visit: Payer: Self-pay | Admitting: Primary Care

## 2022-12-19 DIAGNOSIS — F419 Anxiety disorder, unspecified: Secondary | ICD-10-CM

## 2022-12-19 DIAGNOSIS — K219 Gastro-esophageal reflux disease without esophagitis: Secondary | ICD-10-CM

## 2022-12-19 DIAGNOSIS — N183 Chronic kidney disease, stage 3 unspecified: Secondary | ICD-10-CM

## 2022-12-25 ENCOUNTER — Other Ambulatory Visit: Payer: Self-pay | Admitting: Primary Care

## 2022-12-25 DIAGNOSIS — F32A Depression, unspecified: Secondary | ICD-10-CM

## 2023-01-04 ENCOUNTER — Ambulatory Visit
Admission: RE | Admit: 2023-01-04 | Discharge: 2023-01-04 | Disposition: A | Discharge status: Home / Self Care | Payer: Medicare Other | Attending: Gastroenterology | Admitting: Gastroenterology

## 2023-01-04 ENCOUNTER — Ambulatory Visit: Payer: Medicare Other | Admitting: Anesthesiology

## 2023-01-04 ENCOUNTER — Encounter
Admission: RE | Disposition: A | Discharge status: Home / Self Care | Payer: Self-pay | Source: Home / Self Care | Attending: Gastroenterology

## 2023-01-04 DIAGNOSIS — F32A Depression, unspecified: Secondary | ICD-10-CM | POA: Insufficient documentation

## 2023-01-04 DIAGNOSIS — D122 Benign neoplasm of ascending colon: Secondary | ICD-10-CM | POA: Insufficient documentation

## 2023-01-04 DIAGNOSIS — K219 Gastro-esophageal reflux disease without esophagitis: Secondary | ICD-10-CM | POA: Diagnosis not present

## 2023-01-04 DIAGNOSIS — D126 Benign neoplasm of colon, unspecified: Secondary | ICD-10-CM

## 2023-01-04 DIAGNOSIS — Z87891 Personal history of nicotine dependence: Secondary | ICD-10-CM | POA: Insufficient documentation

## 2023-01-04 DIAGNOSIS — I2510 Atherosclerotic heart disease of native coronary artery without angina pectoris: Secondary | ICD-10-CM | POA: Diagnosis not present

## 2023-01-04 DIAGNOSIS — M1990 Unspecified osteoarthritis, unspecified site: Secondary | ICD-10-CM | POA: Diagnosis not present

## 2023-01-04 DIAGNOSIS — E039 Hypothyroidism, unspecified: Secondary | ICD-10-CM | POA: Insufficient documentation

## 2023-01-04 DIAGNOSIS — Z860100 Personal history of colon polyps, unspecified: Secondary | ICD-10-CM

## 2023-01-04 DIAGNOSIS — G709 Myoneural disorder, unspecified: Secondary | ICD-10-CM | POA: Insufficient documentation

## 2023-01-04 DIAGNOSIS — Z1211 Encounter for screening for malignant neoplasm of colon: Secondary | ICD-10-CM | POA: Insufficient documentation

## 2023-01-04 DIAGNOSIS — K635 Polyp of colon: Secondary | ICD-10-CM | POA: Diagnosis not present

## 2023-01-04 DIAGNOSIS — K5730 Diverticulosis of large intestine without perforation or abscess without bleeding: Secondary | ICD-10-CM | POA: Insufficient documentation

## 2023-01-04 HISTORY — PX: COLONOSCOPY WITH PROPOFOL: SHX5780

## 2023-01-04 SURGERY — COLONOSCOPY WITH PROPOFOL
Anesthesia: General

## 2023-01-04 MED ORDER — PROPOFOL 10 MG/ML IV BOLUS
INTRAVENOUS | Status: DC | PRN
  Administered 2023-01-04: 150 ug/kg/min via INTRAVENOUS
  Administered 2023-01-04: 80 mg via INTRAVENOUS

## 2023-01-04 MED ORDER — LIDOCAINE HCL (CARDIAC) PF 100 MG/5ML IV SOSY
PREFILLED_SYRINGE | INTRAVENOUS | Status: DC | PRN
  Administered 2023-01-04: 100 mg via INTRAVENOUS

## 2023-01-04 MED ORDER — SODIUM CHLORIDE 0.9 % IV SOLN
INTRAVENOUS | Status: DC
  Administered 2023-01-04: 20 mL/h via INTRAVENOUS

## 2023-01-04 MED ORDER — PROPOFOL 10 MG/ML IV BOLUS
INTRAVENOUS | Status: AC
  Filled 2023-01-04: qty 20

## 2023-01-04 NOTE — Brief Op Note (Signed)
One of two ascending colon snared polyps not retrieved

## 2023-01-04 NOTE — Anesthesia Postprocedure Evaluation (Signed)
Anesthesia Post Note  Patient: Grace Ortiz  Procedure(s) Performed: COLONOSCOPY WITH PROPOFOL  Patient location during evaluation: Endoscopy Anesthesia Type: General Level of consciousness: awake and alert Pain management: pain level controlled Vital Signs Assessment: post-procedure vital signs reviewed and stable Respiratory status: spontaneous breathing, nonlabored ventilation, respiratory function stable and patient connected to nasal cannula oxygen Cardiovascular status: blood pressure returned to baseline and stable Postop Assessment: no apparent nausea or vomiting Anesthetic complications: no   No notable events documented.   Last Vitals:  Vitals:   01/04/23 0920 01/04/23 1013  BP: 139/76 123/63  Pulse: 84 78  Resp: 20 16  Temp: 36.7 C (!) 36.2 C  SpO2: 97% 99%    Last Pain:  Vitals:   01/04/23 1013  TempSrc: Temporal  PainSc: Asleep                 Ilene Qua

## 2023-01-04 NOTE — H&P (Signed)
Jonathon Bellows, MD [address] [place], [place], [place], [address] [address], [place], [place], [place], [address] Phone: [phone]  Fax: [phone]  Primary Care Physician:  Pleas Koch, NP   Pre-Procedure History & Physical: HPI:  Grace Ortiz is a 77 y.o. female is here for an colonoscopy.   Past Medical History:  Diagnosis Date   Acute UTI 08/02/2018   Arthritis    Bell's palsy    Cervical polyp    Diverticulitis    Frequent headaches    GERD (gastroesophageal reflux disease)    Hyperlipidemia    Hypothyroidism    Macular retinal puckering, bilateral    Migraines     Past Surgical History:  Procedure Laterality Date   BLEPHAROPLASTY  2008, 2009   BREAST EXCISIONAL BIOPSY Left 05/1993   neg   BREAST EXCISIONAL BIOPSY Left 09/1993   neg   CATARACT EXTRACTION  2008   CERVICAL POLYPECTOMY  2014   COLONOSCOPY     COLONOSCOPY WITH PROPOFOL N/A 12/25/2019   Procedure: COLONOSCOPY WITH PROPOFOL;  Surgeon: Jonathon Bellows, MD;  Location: [facility] ENDOSCOPY;  Service: Gastroenterology;  Laterality: N/A;   DILATION AND CURETTAGE OF UTERUS     HYSTEROSCOPY  2014   UPPER ENDOSCOPY W/ ANTRODUODENAL MANOMETRY      Prior to Admission medications   Medication Sig Start Date End Date Taking? Authorizing Provider  Ascorbic Acid (VITAMIN C PO) Take 1,000 Units by mouth daily.    [provider]  aspirin EC 81 MG tablet Take 81 mg by mouth daily.    [provider]  buPROPion (WELLBUTRIN XL) 300 MG 24 hr tablet TAKE ONE TABLET BY MOUTH DAILY FOR ANXIETY AND DEPRESSION 03/09/22   Pleas Koch, NP  Cholecalciferol (VITAMIN D3 PO) Take 1,200 mg by mouth daily.    [provider]  Cyanocobalamin (VITAMIN B12 PO) Take 2,500 mcg by mouth daily.    [provider]  cycloSPORINE (RESTASIS) 0.05 % ophthalmic emulsion  01/20/21   [provider]  DULoxetine (CYMBALTA) 30 MG capsule TAKE 1 CAPSULE BY MOUTH DAILY FOR ANXIETY AND  DEPRESSION 12/26/22   Pleas Koch, NP  gabapentin (NEURONTIN) 100 MG capsule Take 1-2 capsules (100-200 mg total) by mouth at bedtime. For restless legs. 08/17/22   Pleas Koch, NP  levothyroxine (SYNTHROID) 50 MCG tablet TAKE 1 TABLET BY on Sunday and Monday ON EMPTY STOMACH WITH WATER, NO FOOD OR OTHER MEDS FOR 30 MIN. 07/28/22   Pleas Koch, NP  levothyroxine (SYNTHROID) 75 MCG tablet Take 1 tablet by mouth every morning on an empty stomach with water only Tuesday through Saturday.  No food or other medications for 30 minutes. . 12/26/21   Pleas Koch, NP  losartan (COZAAR) 25 MG tablet TAKE 1 TABLET BY MOUTH DAILY FOR KIDNEY PROTECTION 12/20/22   Pleas Koch, NP  Omega-3 Fatty Acids (FISH OIL PO) Take 360 mg by mouth daily.    [provider]  omeprazole (PRILOSEC) 20 MG capsule TAKE 1 CAPSULE BY MOUTH DAILY FOR HEARTBURN 12/20/22   Pleas Koch, NP  polyethylene glycol (MIRALAX / GLYCOLAX) packet Take 17 g by mouth daily.     [provider]  prochlorperazine (COMPAZINE) 10 MG tablet Take 1 tablet (10 mg total) by mouth every 8 (eight) hours as needed for nausea or vomiting. 02/05/21   Pleas Koch, NP  rosuvastatin (CRESTOR) 20 MG tablet Temporary refill only, office visit needed for further refill.  12/01/22   Minna Merritts, MD  tiZANidine (ZANAFLEX) 4 MG tablet TAKE ONE TABLET BY MOUTH EVERY 8 HOURS AS NEEDED FOR MUSCLE SPASMS 12/19/21   Pleas Koch, NP  traZODone (DESYREL) 50 MG tablet TAKE ONE TABLET BY MOUTH EVERY NIGHT AT BEDTIME AS NEEDED FOR SLEEP 11/05/22   Pleas Koch, NP    Allergies as of 10/16/2022   (No Known Allergies)    Family History  Problem Relation Age of Onset   Breast cancer Maternal Grandmother 70   Cancer Maternal Grandmother        breast   Early death Maternal Grandmother    Hearing loss Mother    Heart disease Mother    Cancer Father        colon   Hearing loss Father    Diabetes  Sister    Early death Paternal Grandfather     Social History   Socioeconomic History   Marital status: Married    Spouse name: Not on file   Number of children: Not on file   Years of education: Not on file   Highest education level: Not on file  Occupational History   Not on file  Tobacco Use   Smoking status: Former    Packs/day: 1.00    Years: 6.00    Additional pack years: 0.00    Total pack years: 6.00    Types: Cigarettes    Quit date: 10/19/1970    Years since quitting: 52.2   Smokeless tobacco: Never  Vaping Use   Vaping Use: Never used  Substance and Sexual Activity   Alcohol use: Yes    Comment: rarely   Drug use: No   Sexual activity: Not on file  Other Topics Concern   Not on file  Social History Narrative   Married.   No children.   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    Moved from [place].   Social Determinants of Health   Financial Resource Strain: Low Risk  (11/30/2022)   Overall Financial Resource Strain (CARDIA)    Difficulty of Paying Living Expenses: Not hard at all  Food Insecurity: No Food Insecurity (11/30/2022)   Hunger Vital Sign    Worried About Running Out of Food in the Last Year: Never true    Ran Out of Food in the Last Year: Never true  Transportation Needs: No Transportation Needs (11/30/2022)   PRAPARE - Hydrologist (Medical): No    Lack of Transportation (Non-Medical): No  Physical Activity: Insufficiently Active (11/30/2022)   Exercise Vital Sign    Days of Exercise per Week: 3 days    Minutes of Exercise per Session: 20 min  Stress: Stress Concern Present (11/30/2022)   [place]    Feeling of Stress : To some extent  Social Connections: Moderately Isolated (11/30/2022)   Social Connection and Isolation Panel [NHANES]    Frequency of Communication with Friends and Family: Twice a week    Frequency of Social Gatherings  with Friends and Family: Once a week    Attends Religious Services: Never    Marine scientist or Organizations: No    Attends Archivist Meetings: Never    Marital Status: Married  Human resources officer Violence: Not At Risk (11/30/2022)   Humiliation, Afraid, Rape, and Kick questionnaire    Fear of Current or Ex-Partner: No    Emotionally Abused: No  Physically Abused: No    Sexually Abused: No    Review of Systems: See HPI, otherwise negative ROS  Physical Exam: There were no vitals taken for this visit. General:   Alert,  pleasant and cooperative in NAD Head:  Normocephalic and atraumatic. Neck:  Supple; no masses or thyromegaly. Lungs:  Clear throughout to auscultation, normal respiratory effort.    Heart:  +S1, +S2, Regular rate and rhythm, No edema. Abdomen:  Soft, nontender and nondistended. Normal bowel sounds, without guarding, and without rebound.   Neurologic:  Alert and  oriented x4;  grossly normal neurologically.  Impression/Plan: Grace Ortiz is here for an colonoscopy to be performed for surveillance due to prior history of colon polyps   Risks, benefits, limitations, and alternatives regarding  colonoscopy have been reviewed with the patient.  Questions have been answered.  All parties agreeable.   Jonathon Bellows, MD  01/04/2023, 9:15 AM

## 2023-01-04 NOTE — Transfer of Care (Signed)
Immediate Anesthesia Transfer of Care Note  Patient: Grace Ortiz  Procedure(s) Performed: COLONOSCOPY WITH PROPOFOL  Patient Location: PACU  Anesthesia Type:General  Level of Consciousness: awake, alert , and oriented  Airway & Oxygen Therapy: Patient Spontanous Breathing  Post-op Assessment: Report given to RN and Post -op Vital signs reviewed and stable  Post vital signs: Reviewed and stable  Last Vitals:  Vitals Value Taken Time  BP 125/71 1012  Temp 35.7 1012  Pulse 79 1012  Resp 18 1012  SpO2 98 1012    Last Pain:  Vitals:   01/04/23 0920  TempSrc: Temporal  PainSc: 0-No pain         Complications: No notable events documented.

## 2023-01-04 NOTE — Anesthesia Preprocedure Evaluation (Signed)
Anesthesia Evaluation  Patient identified by MRN, date of birth, ID band Patient awake    Reviewed: Allergy & Precautions, NPO status , Patient's Chart, lab work & pertinent test results  History of Anesthesia Complications Negative for: history of anesthetic complications  Airway Mallampati: III  TM Distance: >3 FB Neck ROM: full    Dental no notable dental hx.    Pulmonary neg pulmonary ROS, former smoker   Pulmonary exam normal        Cardiovascular + CAD  Normal cardiovascular exam     Neuro/Psych  Headaches PSYCHIATRIC DISORDERS  Depression     Neuromuscular disease    GI/Hepatic Neg liver ROS,GERD  Medicated,,  Endo/Other  Hypothyroidism    Renal/GU Renal disease  negative genitourinary   Musculoskeletal  (+) Arthritis ,    Abdominal   Peds  Hematology negative hematology ROS (+)   Anesthesia Other Findings Past Medical History: 08/02/2018: Acute UTI No date: Arthritis No date: Bell's palsy No date: Cervical polyp No date: Diverticulitis No date: Frequent headaches No date: GERD (gastroesophageal reflux disease) No date: Hyperlipidemia No date: Hypothyroidism No date: Macular retinal puckering, bilateral No date: Migraines  Past Surgical History: 2008, 2009: BLEPHAROPLASTY 05/1993: BREAST EXCISIONAL BIOPSY; Left     Comment:  neg 09/1993: BREAST EXCISIONAL BIOPSY; Left     Comment:  neg 2008: CATARACT EXTRACTION 2014: CERVICAL POLYPECTOMY No date: COLONOSCOPY 12/25/2019: COLONOSCOPY WITH PROPOFOL; N/A     Comment:  Procedure: COLONOSCOPY WITH PROPOFOL;  Surgeon: Jonathon Bellows, MD;  Location: [facility] ENDOSCOPY;  Service:               Gastroenterology;  Laterality: N/A; No date: DILATION AND CURETTAGE OF UTERUS 2014: HYSTEROSCOPY No date: UPPER ENDOSCOPY W/ ANTRODUODENAL MANOMETRY  BMI    Body Mass Index: 30.30 kg/m      Reproductive/Obstetrics negative OB ROS                              Anesthesia Physical Anesthesia Plan  ASA: 2  Anesthesia Plan: General   Post-op Pain Management: Minimal or no pain anticipated   Induction: Intravenous  PONV Risk Score and Plan: Propofol infusion and TIVA  Airway Management Planned: Natural Airway and Nasal Cannula  Additional Equipment:   Intra-op Plan:   Post-operative Plan:   Informed Consent: I have reviewed the patients History and Physical, chart, labs and discussed the procedure including the risks, benefits and alternatives for the proposed anesthesia with the patient or authorized representative who has indicated his/her understanding and acceptance.     Dental Advisory Given  Plan Discussed with: Anesthesiologist, CRNA and Surgeon  Anesthesia Plan Comments: (Patient consented for risks of anesthesia including but not limited to:  - adverse reactions to medications - risk of airway placement if required - damage to eyes, teeth, lips or other oral mucosa - nerve damage due to positioning  - sore throat or hoarseness - Damage to heart, brain, nerves, lungs, other parts of body or loss of life  Patient voiced understanding.)        Anesthesia Quick Evaluation

## 2023-01-04 NOTE — Op Note (Signed)
[facility] Gastroenterology Patient Name: Shelvia Fojtik Procedure Date: 01/04/2023 9:45 AM MRN: [id number] Account #: [id number] Date of Birth: [birth year] Admit Type: Outpatient Age: 77 Room: [facility] ENDO ROOM 1 Gender: Female Note Status: Finalized Instrument Name: Jasper Riling [id number] Procedure:             Colonoscopy Indications:           Surveillance: Personal history of adenomatous polyps                         on last colonoscopy 3 years ago, Last colonoscopy:                         March 2021 Providers:             Jonathon Bellows MD, MD Referring MD:          Pleas Koch (Referring MD) Medicines:             Monitored Anesthesia Care Complications:         No immediate complications. Procedure:             Pre-Anesthesia Assessment:                        - Prior to the procedure, a History and Physical was                         performed, and patient medications, allergies and                         sensitivities were reviewed. The patient's tolerance                         of previous anesthesia was reviewed.                        - The risks and benefits of the procedure and the                         sedation options and risks were discussed with the                         patient. All questions were answered and informed                         consent was obtained.                        - ASA Grade Assessment: II - A patient with mild                         systemic disease.                        After obtaining informed consent, the colonoscope was                         passed under direct vision. Throughout the procedure,                         the patient's blood pressure,  pulse, and oxygen                         saturations were monitored continuously. The                         Colonoscope was introduced through the anus and                         advanced to the the cecum, identified by the                          appendiceal orifice. The colonoscopy was performed                         with ease. The patient tolerated the procedure well.                         The quality of the bowel preparation was excellent.                         The ileocecal valve, appendiceal orifice, and rectum                         were photographed. Findings:      The perianal and digital rectal examinations were normal.      Two sessile polyps were found in the ascending colon. The polyps were 5       to 7 mm in size. These polyps were removed with a cold snare. Resection       was complete, but the polyp tissue was only partially retrieved. To       prevent bleeding post-intervention, one hemostatic clip was successfully       placed. There was no bleeding at the end of the procedure.      Multiple medium-mouthed diverticula were found in the sigmoid colon.      The exam was otherwise without abnormality on direct and retroflexion       views. Impression:            - Two 5 to 7 mm polyps in the ascending colon, removed                         with a cold snare. Complete resection. Partial                         retrieval. Clip was placed.                        - Diverticulosis in the sigmoid colon.                        - The examination was otherwise normal on direct and                         retroflexion views. Recommendation:        - Discharge patient to home (with escort).                        - Resume previous diet.                        -  Continue present medications.                        - Await pathology results.                        - Repeat colonoscopy is not recommended due to current                         age (35 years or older) for surveillance based on                         pathology results. Procedure Code(s):     --- Professional ---                        [phone], Colonoscopy, flexible; with removal of                         tumor(s), polyp(s), or other lesion(s) by snare                          technique Diagnosis Code(s):     --- Professional ---                        Z86.010, Personal history of colonic polyps                        D12.2, Benign neoplasm of ascending colon                        K57.30, Diverticulosis of large intestine without                         perforation or abscess without bleeding CPT copyright 2022 American [employer] Association. All rights reserved. The codes documented in this report are preliminary and upon coder review may  be revised to meet current compliance requirements. Jonathon Bellows, MD Jonathon Bellows MD, MD 01/04/2023 10:07:56 AM This report has been signed electronically. Number of Addenda: 0 Note Initiated On: 01/04/2023 9:45 AM Scope Withdrawal Time: 0 hours 9 minutes 28 seconds  Total Procedure Duration: 0 hours 15 minutes 54 seconds  Estimated Blood Loss:  Estimated blood loss: none.      [facility]

## 2023-01-05 ENCOUNTER — Ambulatory Visit (INDEPENDENT_AMBULATORY_CARE_PROVIDER_SITE_OTHER): Payer: Medicare Other | Admitting: Primary Care

## 2023-01-05 ENCOUNTER — Encounter: Payer: Self-pay | Admitting: Primary Care

## 2023-01-05 VITALS — BP 124/78 | HR 67 | Temp 98.5°F | Ht 59.0 in | Wt 150.0 lb

## 2023-01-05 DIAGNOSIS — R911 Solitary pulmonary nodule: Secondary | ICD-10-CM

## 2023-01-05 DIAGNOSIS — E785 Hyperlipidemia, unspecified: Secondary | ICD-10-CM | POA: Diagnosis not present

## 2023-01-05 DIAGNOSIS — K219 Gastro-esophageal reflux disease without esophagitis: Secondary | ICD-10-CM

## 2023-01-05 DIAGNOSIS — N183 Chronic kidney disease, stage 3 unspecified: Secondary | ICD-10-CM

## 2023-01-05 DIAGNOSIS — E038 Other specified hypothyroidism: Secondary | ICD-10-CM

## 2023-01-05 DIAGNOSIS — G2581 Restless legs syndrome: Secondary | ICD-10-CM | POA: Diagnosis not present

## 2023-01-05 DIAGNOSIS — G4700 Insomnia, unspecified: Secondary | ICD-10-CM

## 2023-01-05 DIAGNOSIS — R519 Headache, unspecified: Secondary | ICD-10-CM | POA: Diagnosis not present

## 2023-01-05 DIAGNOSIS — Z0000 Encounter for general adult medical examination without abnormal findings: Secondary | ICD-10-CM

## 2023-01-05 DIAGNOSIS — Z86010 Personal history of colonic polyps: Secondary | ICD-10-CM

## 2023-01-05 DIAGNOSIS — F339 Major depressive disorder, recurrent, unspecified: Secondary | ICD-10-CM | POA: Diagnosis not present

## 2023-01-05 DIAGNOSIS — M5442 Lumbago with sciatica, left side: Secondary | ICD-10-CM

## 2023-01-05 DIAGNOSIS — G8929 Other chronic pain: Secondary | ICD-10-CM

## 2023-01-05 LAB — IBC + FERRITIN
Ferritin: 71.7 ng/mL (ref 10.0–291.0)
Iron: 66 ug/dL (ref 42–145)
Saturation Ratios: 20.5 % (ref 20.0–50.0)
TIBC: 322 ug/dL (ref 250.0–450.0)
Transferrin: 230 mg/dL (ref 212.0–360.0)

## 2023-01-05 LAB — CBC
HCT: 38.8 % (ref 36.0–46.0)
Hemoglobin: 13.3 g/dL (ref 12.0–15.0)
MCHC: 34.3 g/dL (ref 30.0–36.0)
MCV: 93.2 fl (ref 78.0–100.0)
Platelets: 212 10*3/uL (ref 150.0–400.0)
RBC: 4.16 Mil/uL (ref 3.87–5.11)
RDW: 13.2 % (ref 11.5–15.5)
WBC: 9.3 10*3/uL (ref 4.0–10.5)

## 2023-01-05 LAB — LIPID PANEL
Cholesterol: 131 mg/dL (ref 0–200)
HDL: 64.1 mg/dL (ref >39.00)
LDL Cholesterol: 55 mg/dL (ref 0–99)
NonHDL: 67.04
Total CHOL/HDL Ratio: 2
Triglycerides: 62 mg/dL (ref 0.0–149.0)
VLDL: 12.4 mg/dL (ref 0.0–40.0)

## 2023-01-05 LAB — COMPREHENSIVE METABOLIC PANEL
ALT: 14 U/L (ref 0–35)
AST: 18 U/L (ref 0–37)
Albumin: 3.8 g/dL (ref 3.5–5.2)
Alkaline Phosphatase: 79 U/L (ref 39–117)
BUN: 15 mg/dL (ref 6–23)
CO2: 28 mEq/L (ref 19–32)
Calcium: 10.4 mg/dL (ref 8.4–10.5)
Chloride: 106 mEq/L (ref 96–112)
Creatinine, Ser: 1.24 mg/dL — ABNORMAL HIGH (ref 0.40–1.20)
GFR: 42.24 mL/min — ABNORMAL LOW (ref >60.00)
Glucose, Bld: 86 mg/dL (ref 70–99)
Potassium: 4.2 mEq/L (ref 3.5–5.1)
Sodium: 142 mEq/L (ref 135–145)
Total Bilirubin: 0.4 mg/dL (ref 0.2–1.2)
Total Protein: 6.3 g/dL (ref 6.0–8.3)

## 2023-01-05 LAB — TSH: TSH: 0.55 u[IU]/mL (ref 0.35–5.50)

## 2023-01-05 LAB — SURGICAL PATHOLOGY

## 2023-01-05 MED ORDER — BUPROPION HCL ER (XL) 150 MG PO TB24: 150.0000 mg | ORAL_TABLET | Freq: Every day | ORAL | 0 refills | Status: DC

## 2023-01-05 MED ORDER — SERTRALINE HCL 50 MG PO TABS: 50.0000 mg | ORAL_TABLET | Freq: Every day | ORAL | 0 refills | Status: DC

## 2023-01-05 MED ORDER — PROCHLORPERAZINE MALEATE 10 MG PO TABS: 10.0000 mg | ORAL_TABLET | Freq: Three times a day (TID) | ORAL | 0 refills | Status: DC | PRN

## 2023-01-05 MED ORDER — TIZANIDINE HCL 4 MG PO TABS: ORAL_TABLET | ORAL | 0 refills | Status: DC

## 2023-01-05 NOTE — Assessment & Plan Note (Signed)
Repeat colonoscopy results pending from yesterday. Await results.

## 2023-01-05 NOTE — Assessment & Plan Note (Addendum)
Deteriorated.  Discussed options for treatment.   Stop Duloxetine 30 mg.  Stop Bupropion XL 300 mg daily and start Bupropion XL 150 mg daily to start weaning off.   Start Zoloft 50 mg daily.   Follow up in 4 weeks.  I evaluated patient, was consulted regarding treatment, and agree with assessment and plan per Tinnie Gens, RN, DNP student.   Allie Bossier, NP-C'

## 2023-01-05 NOTE — Assessment & Plan Note (Addendum)
Uncontrolled.  Increase Gabapentin 200 mg HS.   Follow up in 4 weeks.  I evaluated patient, was consulted regarding treatment, and agree with assessment and plan per Tinnie Gens, RN, DNP student.   Allie Bossier, NP-C

## 2023-01-05 NOTE — Progress Notes (Signed)
Established Patient Office Visit  Subjective   Patient ID: Grace Ortiz, female    DOB: 07-05-1946  Age: 77 y.o. MRN: [id number]  Chief Complaint  Patient presents with   Medical Management of Chronic Issues    HPI  Grace Ortiz is a 77  year old female with past medical history of GERD, hypothyroidism, colonic polyps, MDD, hyperlipidemia, CKD stage 3, insomnia, restless legs presents today for a follow up.   1- Hypothyroidism- She is currently managed on Levothyroxine 75 mcg Monday through Friday and 50 mcg on Saturday and Sunday. She has been taking it correctly and is tolerating it well.   2- MDD and Insomnia- Currently managed on Wellbutrin XL 300 mg once daily, Cymbalta 30 mg once daily and Trazodone 50 mg HS PRN.  She does take increase her Trazodone 2-3 times a week to 100 mg. She is still feeling sluggish, less energy, less motivated. She states she can't tell if she has noticed a difference with the Cymbalta.   3- GERD: Currently managed on Omeprazole 20 mg daily. Tolerating it well. No concerns today.   4 - Restless legs- currently managed on Gabapentin 100-200 mg HS. She is taking 200 mg couple times a week but does not see a difference when increasing it to 200 mg.   5- Hyperlipidemia- Currently on Rosuvastatin 20 mg daily. Tolerating it well.   6- CKD stage 3- Currently she is managed on Losartan 25 mg once daily. Tolerating it well.   7. Backpain: Currently managed on Tizanidine 4 mg. She reports that her pain is not better but is not worse. She has it with movement and she tries to do anything.    Immunizations: -Tetanus: Completed in 2017 -Influenza: Completed this season -Shingles: Completed Shingrix series -Pneumonia: Completed   Diet: Fair diet. Decreased appetite.  Exercise: No regular exercise.  Eye exam: Completes annually  Dental exam: Completes four times a year.    Colonoscopy: Completed in March, 2024. Pathology pending. She was told that she may  not need another one. Mammogram: Completed in September, 2023.  Dexa scan: Completed in November, 2023.     Patient Active Problem List   Diagnosis Date Noted   History of colon polyps 01/04/2023   Adenomatous polyp of colon 01/04/2023   Restless legs 07/07/2022   Post covid-19 condition, unspecified 02/26/2022   Abdominal pain 02/05/2021   Pulmonary nodule 12/11/2020   Chronic left-sided low back pain with left-sided sciatica 12/11/2020   Postmenopausal vaginal bleeding 12/28/2019   Healthcare maintenance 11/10/2019   Insomnia 09/06/2019   Chronic kidney disease (CKD), stage III (moderate) 08/03/2018   GERD (gastroesophageal reflux disease) 06/30/2017   Hyperlipidemia 06/30/2017   Hypothyroidism 06/30/2017   Frequent headaches 06/30/2017   MDD (major depressive disorder) 06/30/2017   Past Medical History:  Diagnosis Date   Acute UTI 08/02/2018   Arthritis    Bell's palsy    Cervical polyp    Diverticulitis    Frequent headaches    GERD (gastroesophageal reflux disease)    Hyperlipidemia    Hypothyroidism    Macular retinal puckering, bilateral    Migraines    Past Surgical History:  Procedure Laterality Date   BLEPHAROPLASTY  2008, 2009   BREAST EXCISIONAL BIOPSY Left 05/1993   neg   BREAST EXCISIONAL BIOPSY Left 09/1993   neg   CATARACT EXTRACTION  2008   CERVICAL POLYPECTOMY  2014   COLONOSCOPY     COLONOSCOPY WITH PROPOFOL N/A 12/25/2019   Procedure:  COLONOSCOPY WITH PROPOFOL;  Surgeon: Jonathon Bellows, MD;  Location: [facility] ENDOSCOPY;  Service: Gastroenterology;  Laterality: N/A;   DILATION AND CURETTAGE OF UTERUS     HYSTEROSCOPY  2014   UPPER ENDOSCOPY W/ ANTRODUODENAL MANOMETRY     Social History   Tobacco Use   Smoking status: Former    Packs/day: 1.00    Years: 6.00    Additional pack years: 0.00    Total pack years: 6.00    Types: Cigarettes    Quit date: 10/19/1970    Years since quitting: 52.2   Smokeless tobacco: Never  Vaping Use   Vaping Use:  Never used  Substance Use Topics   Alcohol use: Yes    Comment: rarely   Drug use: No   Family History  Problem Relation Age of Onset   Breast cancer Maternal Grandmother 4   Cancer Maternal Grandmother        breast   Early death Maternal Grandmother    Hearing loss Mother    Heart disease Mother    Cancer Father        colon   Hearing loss Father    Diabetes Sister    Early death Paternal Grandfather    No Known Allergies    Review of Systems  Constitutional:  Negative for chills and fever.  Eyes:  Negative for blurred vision.  Respiratory:  Negative for shortness of breath.   Cardiovascular:  Negative for chest pain.  Gastrointestinal:  Negative for abdominal pain, diarrhea and heartburn.  Genitourinary:  Negative for frequency and urgency.  Musculoskeletal:  Negative for myalgias.  Neurological:  Negative for dizziness and headaches.  Psychiatric/Behavioral:  Positive for depression. The patient is not nervous/anxious.       Objective:     BP 124/78   Pulse 67   Temp 98.5 F (36.9 C) (Temporal)   Ht 4\' 11"  (1.499 m)   Wt 150 lb (68 kg)   SpO2 95%   BMI 30.30 kg/m  BP Readings from Last 3 Encounters:  01/05/23 124/78  01/04/23 (!) 146/124  12/01/22 130/62   Wt Readings from Last 3 Encounters:  01/05/23 150 lb (68 kg)  01/04/23 150 lb (68 kg)  12/01/22 155 lb 6 oz (70.5 kg)      Physical Exam Vitals and nursing note reviewed.  Constitutional:      Appearance: Normal appearance.  HENT:     Right Ear: Tympanic membrane, ear canal and external ear normal.     Left Ear: Tympanic membrane, ear canal and external ear normal.  Eyes:     Extraocular Movements: Extraocular movements intact.     Pupils: Pupils are equal, round, and reactive to light.  Cardiovascular:     Rate and Rhythm: Normal rate and regular rhythm.     Pulses: Normal pulses.     Heart sounds: Normal heart sounds.  Pulmonary:     Effort: Pulmonary effort is normal.     Breath  sounds: Normal breath sounds.  Abdominal:     General: Bowel sounds are normal.     Palpations: Abdomen is soft.  Musculoskeletal:        General: Normal range of motion.  Skin:    General: Skin is warm.  Neurological:     Mental Status: She is alert and oriented to person, place, and time.  Psychiatric:        Mood and Affect: Mood normal.        Behavior: Behavior normal.  No results found for any visits on 01/05/23.     The 10-year ASCVD risk score (Arnett DK, et al., 2019) is: 21.9%    Assessment & Plan:   Problem List Items Addressed This Visit       Digestive   GERD (gastroesophageal reflux disease)    Controlled.   Continue omeprazole 20 mg daily.        Endocrine   Hypothyroidism    Continue levothyroxine 50 mcg two days a week and 75 mcg five days a week.   Repeat TSH pending.       Relevant Orders   TSH     Nervous and Auditory   Chronic left-sided low back pain with left-sided sciatica    Ongoing.   Continue Tizanidine 4 mg every 8 hours as needed.      Relevant Medications   buPROPion (WELLBUTRIN XL) 150 MG 24 hr tablet   sertraline (ZOLOFT) 50 MG tablet   prochlorperazine (COMPAZINE) 10 MG tablet   tiZANidine (ZANAFLEX) 4 MG tablet     Genitourinary   Chronic kidney disease (CKD), stage III (moderate)    Repeat CMP pending.   Continue Losartan 25 mg once daily.        Other   Hyperlipidemia - Primary    Repeat lipid panel pending.   Continue rosuvastatin 20mg  daily.       Relevant Orders   Comprehensive metabolic panel   Lipid panel   Frequent headaches   Relevant Medications   buPROPion (WELLBUTRIN XL) 150 MG 24 hr tablet   sertraline (ZOLOFT) 50 MG tablet   prochlorperazine (COMPAZINE) 10 MG tablet   tiZANidine (ZANAFLEX) 4 MG tablet   MDD (major depressive disorder)    Deteriorated.  Discussed options for treatment.   Stop Duloxetine 30 mg.  Stop Bupropion XL 300 mg daily and start Bupropion XL 150 mg daily  to start weaning off.   Start Zoloft 50 mg daily.   Follow up in 4 weeks.      Relevant Medications   buPROPion (WELLBUTRIN XL) 150 MG 24 hr tablet   sertraline (ZOLOFT) 50 MG tablet   Other Relevant Orders   CBC   IBC + Ferritin   Insomnia    Controlled.   Continue trazodone 50-100 mg at bedtime.       Healthcare maintenance    Immunizations UTD.  Mammogram UTD.  Bone density UTD.   Colonoscopy UTD, pending pathology results.   Recommended regular exercise and a healthy diet.       Restless legs    Chronic.  Increase Gabapentin 200 mg HS.   Follow up in 4 weeks.       Return in about 4 weeks (around 02/02/2023) for depression and restless legs.    Tinnie Gens, BSN-RN, DNP STUDENT

## 2023-01-05 NOTE — Assessment & Plan Note (Signed)
Overall controlled.  Continue Compazine 10 mg PRN. Refill provided today.

## 2023-01-05 NOTE — Assessment & Plan Note (Addendum)
Continue levothyroxine 50 mcg two days a week and 75 mcg five days a week.  She is taking correctly.  Repeat TSH pending.   I evaluated patient, was consulted regarding treatment, and agree with assessment and plan per Tinnie Gens, RN, DNP student.   Allie Bossier, NP-C

## 2023-01-05 NOTE — Assessment & Plan Note (Addendum)
Repeat CMP pending.   Continue Losartan 25 mg once daily.  I evaluated patient, was consulted regarding treatment, and agree with assessment and plan per Tinnie Gens, RN, DNP student.   Allie Bossier, NP-C

## 2023-01-05 NOTE — Assessment & Plan Note (Signed)
Stable. No further imaging warranted per CT scan from October 2023.

## 2023-01-05 NOTE — Assessment & Plan Note (Addendum)
Repeat lipid panel pending.   Continue rosuvastatin 20mg  daily.   I evaluated patient, was consulted regarding treatment, and agree with assessment and plan per Tinnie Gens, RN, DNP student.   Allie Bossier, NP-C

## 2023-01-05 NOTE — Assessment & Plan Note (Addendum)
Ongoing. Tolerable.   Continue Tizanidine 4 mg every 8 hours as needed. Discontinue Cymbalta as this was ineffective for pain and depression.   I evaluated patient, was consulted regarding treatment, and agree with assessment and plan per Tinnie Gens, RN, DNP student.   Allie Bossier, NP-C

## 2023-01-05 NOTE — Assessment & Plan Note (Addendum)
Continued. Do suspect Wellbutrin could be contributing. Working to wean off Wellbutrin.  Continue trazodone 50-100 mg at bedtime.  Follow up in 1 month.  I evaluated patient, was consulted regarding treatment, and agree with assessment and plan per Tinnie Gens, RN, DNP student.   Allie Bossier, NP-C

## 2023-01-05 NOTE — Patient Instructions (Addendum)
Stop by the lab prior to leaving today. I will notify you of your results once received.   Stop Cymbalta and start Zoloft 50 mg one tablet my mouth daily.   Stop Wellbutrin XL 300 mg and start Wellbutrin XL 150 mg daily. Please update once you are close to finishing the 150 mg tablets.   Continue Trazodone 50mg -100mg  at bedtime as needed.   Continue Gabapentin 200 mg for restless legs.   Follow up in four weeks for depression and restless legs.   It was a pleasure to see you today!

## 2023-01-05 NOTE — Assessment & Plan Note (Signed)
Immunizations UTD.  Mammogram UTD.  Bone density UTD.   Colonoscopy UTD, pending pathology results.   Recommended regular exercise and a healthy diet.

## 2023-01-05 NOTE — Progress Notes (Signed)
Subjective:    Patient ID: Grace Ortiz, female    DOB: 1946-09-27, 77 y.o.   MRN: [id number]  HPI  Grace Ortiz is a very pleasant 77 y.o. female with a history of hypothyroidism, chronic back pain, CKD, hyperlipidemia, depression and anxiety, restless legs, insomnia who presents today for follow-up of chronic conditions.   Immunizations: -Tetanus: Completed in 2017 -Influenza: Completed this season -Shingles: Completed Shingrix series -Pneumonia: Completed Prevnar 13 in 2015, Pneumovax 23 in 2017  Mammogram: September 2023 Bone Density Scan: November 2023  Colonoscopy: Completed yesterday. Likely not due again given age. Awaiting results.    1) Depression/Insomnia/Anxiety: Currently managed on bupropion XL 300 mg for which she has been taking for decades.  Also managed on duloxetine 30 mg which was initiated in September 2023 for ongoing depression despite management on citalopram 40 mg daily.  Also managed on trazodone 50 mg at bedtime.  Since her last visit she continues to feel depressed.  She does not believe that the Cymbalta has helped.  She is increased her trazodone to 100 mg at night on occasion with improved sleep.  2) Restless Legs: Currently managed on gabapentin 100 mg-200 mg HS. Mostly taking 100 mg, but continues to notice restless legs at night.  3) GERD: Currently managed on omeprazole 20 mg daily.  Overall feels well managed.  4) Frequent Headaches/Nausea: Chronic, and overall infrequent. She is needing a refill of her Compazine for which she uses sparingly for nausea during major headaches.   5) Chronic Back Pain: Currently managed on Tizanidine 4 mg PRN for which she takes sparingly for chronic back pain. She is needing refills today.  She is also managed on duloxetine 30 mg for depression but also for back pain.  Overall she has not noticed improvement in her back pain since initiation of Cymbalta.  6) Hypothyroidism: Currently managed on levothyroxine 75  mcg tablets 5 days weekly and levothyroxine 50 mcg tablets 2 days weekly.  She is taking levothyroxine every morning on empty stomach with water only.  She denies consumption of food or other medicines for at least 30 minutes.  She separate vitamins, heartburn medicine at least 4 hours.   Review of Systems  Constitutional:  Negative for unexpected weight change.  HENT:  Negative for rhinorrhea.   Respiratory:  Negative for cough and shortness of breath.   Cardiovascular:  Negative for chest pain.  Gastrointestinal:  Negative for constipation and diarrhea.  Genitourinary:  Negative for difficulty urinating.  Musculoskeletal:  Positive for arthralgias and back pain. Negative for myalgias.  Skin:  Negative for rash.  Allergic/Immunologic: Negative for environmental allergies.  Neurological:  Negative for dizziness and headaches.  Psychiatric/Behavioral:  Positive for sleep disturbance.        Depression, see HPI         Past Medical History:  Diagnosis Date   Acute UTI 08/02/2018   Arthritis    Bell's palsy    Cervical polyp    Diverticulitis    Frequent headaches    GERD (gastroesophageal reflux disease)    Hyperlipidemia    Hypothyroidism    Macular retinal puckering, bilateral    Migraines    Post covid-19 condition, unspecified 02/26/2022    Social History   Socioeconomic History   Marital status: Married    Spouse name: Not on file   Number of children: Not on file   Years of education: Not on file   Highest education level: Not on file  Occupational History  Not on file  Tobacco Use   Smoking status: Former    Packs/day: 1.00    Years: 6.00    Additional pack years: 0.00    Total pack years: 6.00    Types: Cigarettes    Quit date: 10/19/1970    Years since quitting: 52.2   Smokeless tobacco: Never  Vaping Use   Vaping Use: Never used  Substance and Sexual Activity   Alcohol use: Yes    Comment: rarely   Drug use: No   Sexual activity: Not on file   Other Topics Concern   Not on file  Social History Narrative   Married.   No children.   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    Moved from [place].   Social Determinants of Health   Financial Resource Strain: Low Risk  (11/30/2022)   Overall Financial Resource Strain (CARDIA)    Difficulty of Paying Living Expenses: Not hard at all  Food Insecurity: No Food Insecurity (11/30/2022)   Hunger Vital Sign    Worried About Running Out of Food in the Last Year: Never true    Ran Out of Food in the Last Year: Never true  Transportation Needs: No Transportation Needs (11/30/2022)   PRAPARE - Hydrologist (Medical): No    Lack of Transportation (Non-Medical): No  Physical Activity: Insufficiently Active (11/30/2022)   Exercise Vital Sign    Days of Exercise per Week: 3 days    Minutes of Exercise per Session: 20 min  Stress: Stress Concern Present (11/30/2022)   [place]    Feeling of Stress : To some extent  Social Connections: Moderately Isolated (11/30/2022)   Social Connection and Isolation Panel [NHANES]    Frequency of Communication with Friends and Family: Twice a week    Frequency of Social Gatherings with Friends and Family: Once a week    Attends Religious Services: Never    Marine scientist or Organizations: No    Attends Archivist Meetings: Never    Marital Status: Married  Human resources officer Violence: Not At Risk (11/30/2022)   Humiliation, Afraid, Rape, and Kick questionnaire    Fear of Current or Ex-Partner: No    Emotionally Abused: No    Physically Abused: No    Sexually Abused: No    Past Surgical History:  Procedure Laterality Date   BLEPHAROPLASTY  2008, 2009   BREAST EXCISIONAL BIOPSY Left 05/1993   neg   BREAST EXCISIONAL BIOPSY Left 09/1993   neg   CATARACT EXTRACTION  2008   CERVICAL POLYPECTOMY  2014   COLONOSCOPY     COLONOSCOPY  WITH PROPOFOL N/A 12/25/2019   Procedure: COLONOSCOPY WITH PROPOFOL;  Surgeon: Jonathon Bellows, MD;  Location: [facility] ENDOSCOPY;  Service: Gastroenterology;  Laterality: N/A;   COLONOSCOPY WITH PROPOFOL N/A 01/04/2023   Procedure: COLONOSCOPY WITH PROPOFOL;  Surgeon: Jonathon Bellows, MD;  Location: [facility] ENDOSCOPY;  Service: Gastroenterology;  Laterality: N/A;   DILATION AND CURETTAGE OF UTERUS     HYSTEROSCOPY  2014   UPPER ENDOSCOPY W/ ANTRODUODENAL MANOMETRY      Family History  Problem Relation Age of Onset   Breast cancer Maternal Grandmother 76   Cancer Maternal Grandmother        breast   Early death Maternal Grandmother    Hearing loss Mother    Heart disease Mother    Cancer Father  colon   Hearing loss Father    Diabetes Sister    Early death Paternal Grandfather     No Known Allergies  Current Outpatient Medications on File Prior to Visit  Medication Sig Dispense Refill   Ascorbic Acid (VITAMIN C PO) Take 1,000 Units by mouth daily.     aspirin EC 81 MG tablet Take 81 mg by mouth daily.     Cholecalciferol (VITAMIN D3 PO) Take 1,200 mg by mouth daily.     Cyanocobalamin (VITAMIN B12 PO) Take 2,500 mcg by mouth daily.     gabapentin (NEURONTIN) 100 MG capsule Take 1-2 capsules (100-200 mg total) by mouth at bedtime. For restless legs. 180 capsule 1   levothyroxine (SYNTHROID) 50 MCG tablet TAKE 1 TABLET BY on Sunday and Monday ON EMPTY STOMACH WITH WATER, NO FOOD OR OTHER MEDS FOR 30 MIN. 32 tablet 1   levothyroxine (SYNTHROID) 75 MCG tablet Take 1 tablet by mouth every morning on an empty stomach with water only Tuesday through Saturday.  No food or other medications for 30 minutes. . 60 tablet 3   losartan (COZAAR) 25 MG tablet TAKE 1 TABLET BY MOUTH DAILY FOR KIDNEY PROTECTION 90 tablet 0   Omega-3 Fatty Acids (FISH OIL PO) Take 360 mg by mouth daily.     omeprazole (PRILOSEC) 20 MG capsule TAKE 1 CAPSULE BY MOUTH DAILY FOR HEARTBURN 90 capsule 0   polyethylene glycol  (MIRALAX / GLYCOLAX) packet Take 17 g by mouth daily.      rosuvastatin (CRESTOR) 20 MG tablet Temporary refill only, office visit needed for further refill. 90 tablet 3   traZODone (DESYREL) 50 MG tablet TAKE ONE TABLET BY MOUTH EVERY NIGHT AT BEDTIME AS NEEDED FOR SLEEP 90 tablet 0   cycloSPORINE (RESTASIS) 0.05 % ophthalmic emulsion  (Patient not taking: Reported on 01/05/2023)     No current facility-administered medications on file prior to visit.    BP 124/78   Pulse 67   Temp 98.5 F (36.9 C) (Temporal)   Ht 4\' 11"  (1.499 m)   Wt 150 lb (68 kg)   SpO2 95%   BMI 30.30 kg/m  Objective:   Physical Exam HENT:     Right Ear: Tympanic membrane and ear canal normal.     Left Ear: Tympanic membrane and ear canal normal.     Nose: Nose normal.  Eyes:     Conjunctiva/sclera: Conjunctivae normal.     Pupils: Pupils are equal, round, and reactive to light.  Neck:     Thyroid: No thyromegaly.  Cardiovascular:     Rate and Rhythm: Normal rate and regular rhythm.     Heart sounds: No murmur heard. Pulmonary:     Effort: Pulmonary effort is normal.     Breath sounds: Normal breath sounds. No rales.  Abdominal:     General: Bowel sounds are normal.     Palpations: Abdomen is soft.     Tenderness: There is no abdominal tenderness.  Musculoskeletal:        General: Normal range of motion.     Cervical back: Neck supple.  Lymphadenopathy:     Cervical: No cervical adenopathy.  Skin:    General: Skin is warm and dry.     Findings: No rash.  Neurological:     Mental Status: She is alert and oriented to person, place, and time.     Cranial Nerves: No cranial nerve deficit.     Deep Tendon Reflexes: Reflexes are normal and  symmetric.  Psychiatric:        Mood and Affect: Mood normal.           Assessment & Plan:  Hyperlipidemia, unspecified hyperlipidemia type Assessment & Plan: Repeat lipid panel pending.   Continue rosuvastatin 20mg  daily.   I evaluated patient, was  consulted regarding treatment, and agree with assessment and plan per Tinnie Gens, RN, DNP student.   Allie Bossier, NP-C   Orders: -     Comprehensive metabolic panel -     Lipid panel  Other specified hypothyroidism Assessment & Plan: Continue levothyroxine 50 mcg two days a week and 75 mcg five days a week.  She is taking correctly.  Repeat TSH pending.   I evaluated patient, was consulted regarding treatment, and agree with assessment and plan per Tinnie Gens, RN, DNP student.   Allie Bossier, NP-C   Orders: -     TSH  Episode of recurrent major depressive disorder, unspecified depression episode severity ([place]) Assessment & Plan: Deteriorated.  Discussed options for treatment.   Stop Duloxetine 30 mg.  Stop Bupropion XL 300 mg daily and start Bupropion XL 150 mg daily to start weaning off.   Start Zoloft 50 mg daily.   Follow up in 4 weeks.  I evaluated patient, was consulted regarding treatment, and agree with assessment and plan per Tinnie Gens, RN, DNP student.   Allie Bossier, NP-C'   Orders: -     CBC -     buPROPion HCl ER (XL); Take 1 tablet (150 mg total) by mouth daily. For depression.  Dispense: 30 tablet; Refill: 0 -     Sertraline HCl; Take 1 tablet (50 mg total) by mouth daily. For depression.  Dispense: 90 tablet; Refill: 0 -     IBC + Ferritin  Frequent headaches Assessment & Plan: Overall controlled.  Continue Compazine 10 mg PRN. Refill provided today.  Orders: -     Prochlorperazine Maleate; Take 1 tablet (10 mg total) by mouth every 8 (eight) hours as needed for nausea or vomiting.  Dispense: 30 tablet; Refill: 0  Chronic left-sided low back pain with left-sided sciatica Assessment & Plan: Ongoing. Tolerable.   Continue Tizanidine 4 mg every 8 hours as needed. Discontinue Cymbalta as this was ineffective for pain and depression.   I evaluated patient, was consulted regarding treatment, and agree with assessment and plan per Tinnie Gens, RN, DNP student.   Allie Bossier, NP-C   Orders: -     tiZANidine HCl; TAKE ONE TABLET BY MOUTH EVERY 8 HOURS AS NEEDED FOR MUSCLE SPASMS  Dispense: 30 tablet; Refill: 0  Gastroesophageal reflux disease without esophagitis Assessment & Plan: Controlled.   Continue omeprazole 20 mg daily.  I evaluated patient, was consulted regarding treatment, and agree with assessment and plan per Tinnie Gens, RN, DNP student.   Allie Bossier, NP-C    Stage 3 chronic kidney disease, unspecified whether stage 3a or 3b CKD ([place]) Assessment & Plan: Repeat CMP pending.   Continue Losartan 25 mg once daily.  I evaluated patient, was consulted regarding treatment, and agree with assessment and plan per Tinnie Gens, RN, DNP student.   Allie Bossier, NP-C    Healthcare maintenance Assessment & Plan: Immunizations UTD.  Mammogram UTD.  Bone density UTD.   Colonoscopy UTD, pending pathology results.   Recommended regular exercise and a healthy diet.    Insomnia, unspecified type Assessment & Plan: Continued. Do suspect Wellbutrin could be contributing. Working to wean off Wellbutrin.  Continue trazodone 50-100 mg at bedtime.  Follow up in 1 month.  I evaluated patient, was consulted regarding treatment, and agree with assessment and plan per Tinnie Gens, RN, DNP student.   Allie Bossier, NP-C    Restless legs Assessment & Plan: Uncontrolled.  Increase Gabapentin 200 mg HS.   Follow up in 4 weeks.  I evaluated patient, was consulted regarding treatment, and agree with assessment and plan per Tinnie Gens, RN, DNP student.   Allie Bossier, NP-C    Pulmonary nodule Assessment & Plan: Stable. No further imaging warranted per CT scan from October 2023.   History of colon polyps Assessment & Plan: Repeat colonoscopy results pending from yesterday. Await results.          Pleas Koch, NP

## 2023-01-05 NOTE — Assessment & Plan Note (Addendum)
Controlled.   Continue omeprazole 20 mg daily.   I evaluated patient, was consulted regarding treatment, and agree with assessment and plan per John Vasconcelos, RN, DNP student.   Kate Clark, NP-C  

## 2023-01-06 ENCOUNTER — Encounter: Payer: Self-pay | Admitting: Gastroenterology

## 2023-01-14 ENCOUNTER — Other Ambulatory Visit: Payer: Self-pay | Admitting: Primary Care

## 2023-01-14 DIAGNOSIS — G2581 Restless legs syndrome: Secondary | ICD-10-CM

## 2023-01-14 DIAGNOSIS — K219 Gastro-esophageal reflux disease without esophagitis: Secondary | ICD-10-CM

## 2023-01-14 DIAGNOSIS — G4700 Insomnia, unspecified: Secondary | ICD-10-CM

## 2023-01-14 MED ORDER — OMEPRAZOLE 20 MG PO CPDR: DELAYED_RELEASE_CAPSULE | ORAL | 3 refills | Status: DC

## 2023-01-14 MED ORDER — GABAPENTIN 100 MG PO CAPS: 100.0000 mg | ORAL_CAPSULE | Freq: Every day | ORAL | 3 refills | Status: DC

## 2023-01-14 MED ORDER — TRAZODONE HCL 50 MG PO TABS: ORAL_TABLET | ORAL | 3 refills | Status: DC

## 2023-01-25 ENCOUNTER — Other Ambulatory Visit: Payer: Self-pay | Admitting: Primary Care

## 2023-01-25 DIAGNOSIS — F339 Major depressive disorder, recurrent, unspecified: Secondary | ICD-10-CM

## 2023-01-26 ENCOUNTER — Other Ambulatory Visit: Payer: Self-pay | Admitting: Primary Care

## 2023-01-26 DIAGNOSIS — F339 Major depressive disorder, recurrent, unspecified: Secondary | ICD-10-CM

## 2023-01-29 ENCOUNTER — Other Ambulatory Visit: Payer: Self-pay | Admitting: Primary Care

## 2023-01-29 DIAGNOSIS — F339 Major depressive disorder, recurrent, unspecified: Secondary | ICD-10-CM

## 2023-01-31 ENCOUNTER — Other Ambulatory Visit: Payer: Self-pay | Admitting: Primary Care

## 2023-01-31 DIAGNOSIS — E038 Other specified hypothyroidism: Secondary | ICD-10-CM

## 2023-02-01 MED ORDER — LEVOTHYROXINE SODIUM 75 MCG PO TABS
ORAL_TABLET | ORAL | 3 refills | Status: DC
Start: 2023-02-01 — End: 2024-04-18

## 2023-02-01 MED ORDER — LEVOTHYROXINE SODIUM 50 MCG PO TABS
ORAL_TABLET | ORAL | 3 refills | Status: DC
Start: 2023-02-01 — End: 2024-03-06

## 2023-02-02 ENCOUNTER — Encounter: Payer: Self-pay | Admitting: Primary Care

## 2023-02-02 ENCOUNTER — Ambulatory Visit (INDEPENDENT_AMBULATORY_CARE_PROVIDER_SITE_OTHER): Payer: Medicare Other | Admitting: Primary Care

## 2023-02-02 VITALS — BP 130/84 | HR 59 | Temp 98.6°F | Ht 59.0 in | Wt 149.0 lb

## 2023-02-02 DIAGNOSIS — F330 Major depressive disorder, recurrent, mild: Secondary | ICD-10-CM

## 2023-02-02 DIAGNOSIS — G2581 Restless legs syndrome: Secondary | ICD-10-CM

## 2023-02-02 DIAGNOSIS — G4700 Insomnia, unspecified: Secondary | ICD-10-CM

## 2023-02-02 NOTE — Assessment & Plan Note (Signed)
Improving!  Continue Zoloft 50 mg daily and Wellbutrin XL 150 mg daily. Hold Trazodone due to dry mouth at night.  She will update.

## 2023-02-02 NOTE — Patient Instructions (Signed)
Try stopping your Trazodone to see if this helps with dry mouth.  Increase gabapentin to 200 mg at bedtime.  Continue Zoloft 50 mg daily and Wellbutrin XL 150 mg daily.  It was a pleasure to see you today!

## 2023-02-02 NOTE — Assessment & Plan Note (Signed)
Overall controlled.  Increase gabapentin to 200 HS.

## 2023-02-02 NOTE — Progress Notes (Signed)
Subjective:    Patient ID: Grace Ortiz, female    DOB: 1946/08/30, 77 y.o.   MRN: [id number]  HPI  Grace Ortiz is a very pleasant 77 y.o. female with a history of MDD and restless legs who presents today for follow up of depression and restless legs.  She was last evaluated on 01/05/23 for her annual check up. During this visit she mentioned continued restless legs despite taking gabapentin 100 mg HS. She also mentioned continued depression despite management on Cymbalta 30 mg daily bupropion XL 300 mg. She also mentioned difficulty with sleeping at night despite Trazodone 50-100 mg.   During her last visit we reduced her Wellbutrin XL to 150 mg for insomnia. Her Trazodone 50-100 mg HS was continued. Her duloxetine 30 mg was discontinued. Zoloft 50 mg was added. Gabapentin was increased to 200 mg HS. She is here today for follow up.  Since her last visit she's feeling slightly better. She feels less down and depressed. She actually began Zoloft about 2 weeks ago. She continues to experience difficulty sleeping some nights. She does nap during the day sometimes.   She continues to take gabapentin 100 mg HS, overall feels well managed. She will have to take an extra 100 mg several times weekly.    Review of Systems  Cardiovascular:  Negative for chest pain.  Gastrointestinal:  Negative for abdominal pain and nausea.  Neurological:  Negative for headaches.         Past Medical History:  Diagnosis Date   Acute UTI 08/02/2018   Arthritis    Bell's palsy    Cervical polyp    Diverticulitis    Frequent headaches    GERD (gastroesophageal reflux disease)    Hyperlipidemia    Hypothyroidism    Macular retinal puckering, bilateral    Migraines    Post covid-19 condition, unspecified 02/26/2022    Social History   Socioeconomic History   Marital status: Married    Spouse name: Not on file   Number of children: Not on file   Years of education: Not on file   Highest  education level: 12th grade  Occupational History   Not on file  Tobacco Use   Smoking status: Former    Packs/day: 1.00    Years: 6.00    Additional pack years: 0.00    Total pack years: 6.00    Types: Cigarettes    Quit date: 10/19/1970    Years since quitting: 52.3   Smokeless tobacco: Never  Vaping Use   Vaping Use: Never used  Substance and Sexual Activity   Alcohol use: Yes    Comment: rarely   Drug use: No   Sexual activity: Not on file  Other Topics Concern   Not on file  Social History Narrative   Married.   No children.   Retired. Once worked as an Manufacturing engineer.    Moved from [place].   Social Determinants of Health   Financial Resource Strain: Low Risk  (01/29/2023)   Overall Financial Resource Strain (CARDIA)    Difficulty of Paying Living Expenses: Not hard at all  Food Insecurity: No Food Insecurity (01/29/2023)   Hunger Vital Sign    Worried About Running Out of Food in the Last Year: Never true    Ran Out of Food in the Last Year: Never true  Transportation Needs: No Transportation Needs (01/29/2023)   PRAPARE - Transportation    Lack of Transportation (Medical): No    Lack of  Transportation (Non-Medical): No  Physical Activity: Inactive (01/29/2023)   Exercise Vital Sign    Days of Exercise per Week: 0 days    Minutes of Exercise per Session: 20 min  Stress: No Stress Concern Present (01/29/2023)   [employer] of Occupational Health - Occupational Stress Questionnaire    Feeling of Stress : Only a little  Recent Concern: Stress - Stress Concern Present (11/30/2022)   [employer] of Occupational Health - Occupational Stress Questionnaire    Feeling of Stress : To some extent  Social Connections: Socially Isolated (01/29/2023)   Social Connection and Isolation Panel [NHANES]    Frequency of Communication with Friends and Family: Once a week    Frequency of Social Gatherings with Friends and Family: Once a week    Attends Religious  Services: Never    Database administrator or Organizations: No    Attends Banker Meetings: Never    Marital Status: Married  Catering manager Violence: Not At Risk (11/30/2022)   Humiliation, Afraid, Rape, and Kick questionnaire    Fear of Current or Ex-Partner: No    Emotionally Abused: No    Physically Abused: No    Sexually Abused: No    Past Surgical History:  Procedure Laterality Date   BLEPHAROPLASTY  2008, 2009   BREAST EXCISIONAL BIOPSY Left 05/1993   neg   BREAST EXCISIONAL BIOPSY Left 09/1993   neg   CATARACT EXTRACTION  2008   CERVICAL POLYPECTOMY  2014   COLONOSCOPY     COLONOSCOPY WITH PROPOFOL N/A 12/25/2019   Procedure: COLONOSCOPY WITH PROPOFOL;  Surgeon: Wyline Mood, MD;  Location: [facility] ENDOSCOPY;  Service: Gastroenterology;  Laterality: N/A;   COLONOSCOPY WITH PROPOFOL N/A 01/04/2023   Procedure: COLONOSCOPY WITH PROPOFOL;  Surgeon: Wyline Mood, MD;  Location: [facility] ENDOSCOPY;  Service: Gastroenterology;  Laterality: N/A;   DILATION AND CURETTAGE OF UTERUS     HYSTEROSCOPY  2014   UPPER ENDOSCOPY W/ ANTRODUODENAL MANOMETRY      Family History  Problem Relation Age of Onset   Breast cancer Maternal Grandmother 30   Cancer Maternal Grandmother        breast   Early death Maternal Grandmother    Hearing loss Mother    Heart disease Mother    Cancer Father        colon   Hearing loss Father    Diabetes Sister    Early death Paternal Grandfather     No Known Allergies  Current Outpatient Medications on File Prior to Visit  Medication Sig Dispense Refill   Ascorbic Acid (VITAMIN C PO) Take 1,000 Units by mouth daily.     aspirin EC 81 MG tablet Take 81 mg by mouth daily.     buPROPion (WELLBUTRIN XL) 150 MG 24 hr tablet TAKE 1 TABLET (150 MG TOTAL) BY MOUTH DAILY. FOR DEPRESSION. 90 tablet 0   Cholecalciferol (VITAMIN D3 PO) Take 1,200 mg by mouth daily.     Cyanocobalamin (VITAMIN B12 PO) Take 1,000 mcg by mouth daily.     gabapentin  (NEURONTIN) 100 MG capsule Take 1-2 capsules (100-200 mg total) by mouth at bedtime. For restless legs. 180 capsule 3   levothyroxine (SYNTHROID) 50 MCG tablet TAKE 1 TABLET BY MOUTH ON SUNDAY AND MONDAY TAKE ON EMPTY STOMACH WITH NO OTHER MEDS FOR 30 MINUTES 24 tablet 3   levothyroxine (SYNTHROID) 75 MCG tablet Take 1 tablet by mouth every morning on an empty stomach with water only Tuesday through Saturday.  No food or other medications for 30 minutes. . 60 tablet 3   losartan (COZAAR) 25 MG tablet TAKE 1 TABLET BY MOUTH DAILY FOR KIDNEY PROTECTION 90 tablet 0   Omega-3 Fatty Acids (FISH OIL PO) Take 360 mg by mouth daily.     omeprazole (PRILOSEC) 20 MG capsule TAKE 1 CAPSULE (20 MG TOTAL) BY MOUTH DAILY. FOR HEARTBURN. 90 capsule 3   polyethylene glycol (MIRALAX / GLYCOLAX) packet Take 17 g by mouth daily.      prochlorperazine (COMPAZINE) 10 MG tablet Take 1 tablet (10 mg total) by mouth every 8 (eight) hours as needed for nausea or vomiting. 30 tablet 0   rosuvastatin (CRESTOR) 20 MG tablet Temporary refill only, office visit needed for further refill. 90 tablet 3   sertraline (ZOLOFT) 50 MG tablet Take 1 tablet (50 mg total) by mouth daily. For depression. 90 tablet 0   tiZANidine (ZANAFLEX) 4 MG tablet TAKE ONE TABLET BY MOUTH EVERY 8 HOURS AS NEEDED FOR MUSCLE SPASMS 30 tablet 0   traZODone (DESYREL) 50 MG tablet TAKE ONE TABLET BY MOUTH EVERY NIGHT AT BEDTIME AS NEEDED FOR SLEEP 90 tablet 3   cycloSPORINE (RESTASIS) 0.05 % ophthalmic emulsion  (Patient not taking: Reported on 01/05/2023)     No current facility-administered medications on file prior to visit.    BP 130/84   Pulse (!) 59   Temp 98.6 F (37 C) (Temporal)   Ht  (1.499 m)   Wt 149 lb (67.6 kg)   SpO2 96%   BMI 30.09 kg/m  Objective:   Physical Exam Cardiovascular:     Rate and Rhythm: Normal rate and regular rhythm.  Pulmonary:     Effort: Pulmonary effort is normal.     Breath sounds: Normal breath sounds.   Musculoskeletal:     Cervical back: Neck supple.  Skin:    General: Skin is warm and dry.           Assessment & Plan:  Mild episode of recurrent major depressive disorder Assessment & Plan: Improving!  Continue Zoloft 50 mg daily and Wellbutrin XL 150 mg daily. Hold Trazodone due to dry mouth at night.  She will update.    Insomnia, unspecified type Assessment & Plan: Stop Trazodone due to dry mouth.  Increase gabapentin to 200 mg HS. She will update.    Restless legs Assessment & Plan: Overall controlled.  Increase gabapentin to 200 HS.          Doreene Nest, NP

## 2023-02-02 NOTE — Assessment & Plan Note (Signed)
Stop Trazodone due to dry mouth.  Increase gabapentin to 200 mg HS. She will update.

## 2023-03-01 DIAGNOSIS — N183 Chronic kidney disease, stage 3 unspecified: Secondary | ICD-10-CM

## 2023-03-01 MED ORDER — LOSARTAN POTASSIUM 25 MG PO TABS
ORAL_TABLET | ORAL | 2 refills | Status: DC
Start: 2023-03-01 — End: 2023-11-25

## 2023-03-27 ENCOUNTER — Other Ambulatory Visit: Payer: Self-pay | Admitting: Primary Care

## 2023-03-27 DIAGNOSIS — F339 Major depressive disorder, recurrent, unspecified: Secondary | ICD-10-CM

## 2023-04-07 ENCOUNTER — Other Ambulatory Visit: Payer: Self-pay | Admitting: Primary Care

## 2023-04-12 DIAGNOSIS — Z961 Presence of intraocular lens: Secondary | ICD-10-CM | POA: Diagnosis not present

## 2023-04-12 DIAGNOSIS — H43812 Vitreous degeneration, left eye: Secondary | ICD-10-CM | POA: Diagnosis not present

## 2023-04-29 ENCOUNTER — Other Ambulatory Visit: Payer: Self-pay | Admitting: Primary Care

## 2023-04-29 DIAGNOSIS — F339 Major depressive disorder, recurrent, unspecified: Secondary | ICD-10-CM

## 2023-05-24 ENCOUNTER — Other Ambulatory Visit: Payer: Self-pay | Admitting: Primary Care

## 2023-05-24 DIAGNOSIS — Z1231 Encounter for screening mammogram for malignant neoplasm of breast: Secondary | ICD-10-CM

## 2023-05-30 ENCOUNTER — Other Ambulatory Visit: Payer: Self-pay | Admitting: Primary Care

## 2023-05-30 DIAGNOSIS — E038 Other specified hypothyroidism: Secondary | ICD-10-CM

## 2023-06-03 ENCOUNTER — Other Ambulatory Visit: Payer: Self-pay | Admitting: Cardiovascular Disease

## 2023-06-19 ENCOUNTER — Other Ambulatory Visit: Payer: Self-pay | Admitting: Primary Care

## 2023-06-19 DIAGNOSIS — E038 Other specified hypothyroidism: Secondary | ICD-10-CM

## 2023-07-08 ENCOUNTER — Ambulatory Visit
Admission: RE | Admit: 2023-07-08 | Discharge: 2023-07-08 | Disposition: A | Discharge status: Home / Self Care | Payer: Medicare Other | Source: Ambulatory Visit | Attending: Primary Care | Admitting: Primary Care

## 2023-07-08 DIAGNOSIS — Z1231 Encounter for screening mammogram for malignant neoplasm of breast: Secondary | ICD-10-CM | POA: Diagnosis present

## 2023-08-15 ENCOUNTER — Other Ambulatory Visit: Payer: Self-pay | Admitting: Primary Care

## 2023-08-15 DIAGNOSIS — E038 Other specified hypothyroidism: Secondary | ICD-10-CM

## 2023-08-15 DIAGNOSIS — G2581 Restless legs syndrome: Secondary | ICD-10-CM

## 2023-08-17 DIAGNOSIS — Z23 Encounter for immunization: Secondary | ICD-10-CM | POA: Diagnosis not present

## 2023-08-20 ENCOUNTER — Other Ambulatory Visit: Payer: Self-pay | Admitting: Primary Care

## 2023-08-20 ENCOUNTER — Other Ambulatory Visit: Payer: Self-pay | Admitting: Cardiovascular Disease

## 2023-08-20 DIAGNOSIS — F339 Major depressive disorder, recurrent, unspecified: Secondary | ICD-10-CM

## 2023-08-20 DIAGNOSIS — G2581 Restless legs syndrome: Secondary | ICD-10-CM

## 2023-09-23 ENCOUNTER — Other Ambulatory Visit: Payer: Self-pay | Admitting: Primary Care

## 2023-09-23 DIAGNOSIS — F339 Major depressive disorder, recurrent, unspecified: Secondary | ICD-10-CM

## 2023-09-23 DIAGNOSIS — E038 Other specified hypothyroidism: Secondary | ICD-10-CM

## 2023-09-23 DIAGNOSIS — N183 Chronic kidney disease, stage 3 unspecified: Secondary | ICD-10-CM

## 2023-09-27 DIAGNOSIS — D225 Melanocytic nevi of trunk: Secondary | ICD-10-CM | POA: Diagnosis not present

## 2023-09-27 DIAGNOSIS — L728 Other follicular cysts of the skin and subcutaneous tissue: Secondary | ICD-10-CM | POA: Diagnosis not present

## 2023-09-27 DIAGNOSIS — D2261 Melanocytic nevi of right upper limb, including shoulder: Secondary | ICD-10-CM | POA: Diagnosis not present

## 2023-09-27 DIAGNOSIS — L821 Other seborrheic keratosis: Secondary | ICD-10-CM | POA: Diagnosis not present

## 2023-09-27 DIAGNOSIS — D2262 Melanocytic nevi of left upper limb, including shoulder: Secondary | ICD-10-CM | POA: Diagnosis not present

## 2023-09-27 DIAGNOSIS — D2272 Melanocytic nevi of left lower limb, including hip: Secondary | ICD-10-CM | POA: Diagnosis not present

## 2023-09-27 DIAGNOSIS — D2271 Melanocytic nevi of right lower limb, including hip: Secondary | ICD-10-CM | POA: Diagnosis not present

## 2023-10-06 ENCOUNTER — Other Ambulatory Visit: Payer: Self-pay | Admitting: Primary Care

## 2023-10-06 DIAGNOSIS — F339 Major depressive disorder, recurrent, unspecified: Secondary | ICD-10-CM

## 2023-10-07 DIAGNOSIS — F339 Major depressive disorder, recurrent, unspecified: Secondary | ICD-10-CM

## 2023-10-07 NOTE — Telephone Encounter (Signed)
Called and spoke with patients pharmacy. Patient is due soon for bupropion fill, Losartan patient has enough until February and sertraline patient has 76 pills left at the pharmacy to pickup.   Called and advised patient, she would like bupropion to go ahead and be filled so she can pickup at the same time as her other meds. She will request to fill the remaining 76 pills of sertraline script.

## 2023-10-08 MED ORDER — BUPROPION HCL ER (XL) 150 MG PO TB24
150.0000 mg | ORAL_TABLET | Freq: Every day | ORAL | 0 refills | Status: DC
Start: 2023-10-08 — End: 2024-01-02

## 2023-10-08 NOTE — Telephone Encounter (Signed)
Noted, Rx's sent to pharmacy 

## 2023-11-25 DIAGNOSIS — N183 Chronic kidney disease, stage 3 unspecified: Secondary | ICD-10-CM

## 2023-11-25 MED ORDER — LOSARTAN POTASSIUM 25 MG PO TABS
ORAL_TABLET | ORAL | 0 refills | Status: DC
Start: 2023-11-25 — End: 2024-02-21

## 2023-12-02 ENCOUNTER — Ambulatory Visit: Payer: Medicare Other

## 2023-12-02 VITALS — Ht 59.0 in | Wt 149.0 lb

## 2023-12-02 DIAGNOSIS — Z0000 Encounter for general adult medical examination without abnormal findings: Secondary | ICD-10-CM | POA: Diagnosis not present

## 2023-12-02 NOTE — Progress Notes (Signed)
Subjective:   Grace Ortiz is a 78 y.o. female who presents for Medicare Annual (Subsequent) preventive examination.  Visit Complete: Virtual I connected with  Tanja Port on 12/02/23 by a audio enabled telemedicine application and verified that I am speaking with the correct person using two identifiers.  Patient Location: Home  Provider Location: Office/Clinic  I discussed the limitations of evaluation and management by telemedicine. The patient expressed understanding and agreed to proceed.  Vital Signs: Because this visit was a virtual/telehealth visit, some criteria may be missing or patient reported. Any vitals not documented were not able to be obtained and vitals that have been documented are patient reported.  Patient Medicare AWV questionnaire was completed by the patient on 11/25/23; I have confirmed that all information answered by patient is correct and no changes since this date.  Cardiac Risk Factors include: advanced age (>10men, >19 women);obesity (BMI >30kg/m2);sedentary lifestyle;dyslipidemia     Objective:    Today's Vitals   12/02/23 1015  Weight: 149 lb (67.6 kg)  Height: 4\' 11"  (1.499 m)   Body mass index is 30.09 kg/m.     01/04/2023    9:18 AM 11/30/2022    1:13 PM 11/28/2021    2:42 PM 11/27/2020    3:25 PM 12/25/2019    8:02 AM 11/02/2019   10:45 AM 10/31/2018    1:20 PM  Advanced Directives  Does Patient Have a Medical Advance Directive? Yes Yes Yes Yes Yes Yes Yes  Type of Estate agent of [place];Living will Healthcare Power of [place];Living will Healthcare Power of [place];Living will Healthcare Power of [place];Living will Healthcare Power of [place];Living will Healthcare Power of [place];Living will Healthcare Power of [place];Living will  Does patient want to make changes to medical advance directive?  No - Patient declined Yes (MAU/Ambulatory/Procedural Areas - Information given)      Copy of Healthcare Power of  Attorney in Chart?  Yes - validated most recent copy scanned in chart (See row information)  No - copy requested No - copy requested No - copy requested No - copy requested    Current Medications (verified) Outpatient Encounter Medications as of 12/02/2023  Medication Sig   Ascorbic Acid (VITAMIN C PO) Take 1,000 Units by mouth daily.   aspirin EC 81 MG tablet Take 81 mg by mouth daily.   buPROPion (WELLBUTRIN XL) 150 MG 24 hr tablet Take 1 tablet (150 mg total) by mouth daily. For depression.   Cholecalciferol (VITAMIN D3 PO) Take 1,200 mg by mouth daily.   Cyanocobalamin (VITAMIN B12 PO) Take 1,000 mcg by mouth daily.   cycloSPORINE (RESTASIS) 0.05 % ophthalmic emulsion  (Patient not taking: Reported on 01/05/2023)   gabapentin (NEURONTIN) 100 MG capsule Take 1-2 capsules (100-200 mg total) by mouth at bedtime. For restless legs.   levothyroxine (SYNTHROID) 50 MCG tablet TAKE 1 TABLET BY MOUTH ON SUNDAY AND MONDAY TAKE ON EMPTY STOMACH WITH NO OTHER MEDS FOR 30 MINUTES   levothyroxine (SYNTHROID) 75 MCG tablet Take 1 tablet by mouth every morning on an empty stomach with water only Tuesday through Saturday.  No food or other medications for 30 minutes. Marland Kitchen   losartan (COZAAR) 25 MG tablet TAKE 1 TABLET BY MOUTH DAILY FOR KIDNEY PROTECTION   Omega-3 Fatty Acids (FISH OIL PO) Take 360 mg by mouth daily.   omeprazole (PRILOSEC) 20 MG capsule TAKE 1 CAPSULE (20 MG TOTAL) BY MOUTH DAILY. FOR HEARTBURN.   polyethylene glycol (MIRALAX / GLYCOLAX) packet Take 17 g  by mouth daily.    prochlorperazine (COMPAZINE) 10 MG tablet Take 1 tablet (10 mg total) by mouth every 8 (eight) hours as needed for nausea or vomiting.   rosuvastatin (CRESTOR) 20 MG tablet TAKE 1 TABLET BY MOUTH EVERY DAY   sertraline (ZOLOFT) 50 MG tablet TAKE 1 TABLET (50 MG TOTAL) BY MOUTH DAILY. FOR DEPRESSION.   tiZANidine (ZANAFLEX) 4 MG tablet TAKE ONE TABLET BY MOUTH EVERY 8 HOURS AS NEEDED FOR MUSCLE SPASMS   traZODone (DESYREL) 50  MG tablet TAKE ONE TABLET BY MOUTH EVERY NIGHT AT BEDTIME AS NEEDED FOR SLEEP   No facility-administered encounter medications on file as of 12/02/2023.    Allergies (verified) Patient has no known allergies.   History: Past Medical History:  Diagnosis Date   Acute UTI 08/02/2018   Arthritis    Bell's palsy    Cervical polyp    Diverticulitis    Frequent headaches    GERD (gastroesophageal reflux disease)    Hyperlipidemia    Hypothyroidism    Macular retinal puckering, bilateral    Migraines    Post covid-19 condition, unspecified 02/26/2022   Past Surgical History:  Procedure Laterality Date   BLEPHAROPLASTY  2008, 2009   BREAST EXCISIONAL BIOPSY Left 05/1993   neg   BREAST EXCISIONAL BIOPSY Left 09/1993   neg   CATARACT EXTRACTION  2008   CERVICAL POLYPECTOMY  2014   COLONOSCOPY     COLONOSCOPY WITH PROPOFOL N/A 12/25/2019   Procedure: COLONOSCOPY WITH PROPOFOL;  Surgeon: Wyline Mood, MD;  Location: [facility] ENDOSCOPY;  Service: Gastroenterology;  Laterality: N/A;   COLONOSCOPY WITH PROPOFOL N/A 01/04/2023   Procedure: COLONOSCOPY WITH PROPOFOL;  Surgeon: Wyline Mood, MD;  Location[facility] END[facility]COPY;  Service: Gastroenterology;  Laterality: N/A;   DILATION AND CURETTAGE OF UTERUS     HYSTEROSCOPY  2014   UPPER ENDOSCOPY W/ ANTRODUODENAL MANOMETRY     Family History  Problem Relation Age of Onset   Breast cancer Maternal Grandmother 30   Cancer Maternal Grandmother        breast   Early death Maternal Grandmother    Hearing loss Mother    Heart disease Mother    Cancer Father        colon   Hearing loss Father    Diabetes Sister    Early death Paternal Grandfather    Social History   Socioeconomic History   Marital status: Married    Spouse name: Not on file   Number of children: Not on file   Years of education: Not on file   Highest education level: 12th grade  Occupational History   Not on file  Tobacco Use   Smoking status: Former    Current packs/day:  0.00    Average packs/day: 1 pack/day for 6.0 years (6.0 ttl pk-yrs)    Types: Cigarettes    Start date: 10/19/1964    Quit date: 10/19/1970    Years since quitting: 53.1   Smokeless tobacco: Never  Vaping Use   Vaping status: Never Used  Substance and Sexual Activity   Alcohol use: Yes    Comment: rarely   Drug use: No   Sexual activity: Not on file  Other Topics Concern   Not on file  Social History Narrative   Married.   No children.   Retired. Once worked as an Manufacturing engineer.    Moved from [place].   Social Drivers of Health   Financial Resource Strain: Low Risk  (01/29/2023)  Overall Financial Resource Strain (CARDIA)    Difficulty of Paying Living Expenses: Not hard at all  Food Insecurity: No Food Insecurity (01/29/2023)   Hunger Vital Sign    Worried About Running Out of Food in the Last Year: Never true    Ran Out of Food in the Last Year: Never true  Transportation Needs: No Transportation Needs (01/29/2023)   PRAPARE - Administrator, Civil Service (Medical): No    Lack of Transportation (Non-Medical): No  Physical Activity: Inactive (12/02/2023)   Exercise Vital Sign    Days of Exercise per Week: 0 days    Minutes of Exercise per Session: 0 min  Stress: No Stress Concern Present (12/02/2023)   [employer] of Occupational Health - Occupational Stress Questionnaire    Feeling of Stress : Only a little  Social Connections: Socially Isolated (01/29/2023)   Social Connection and Isolation Panel [NHANES]    Frequency of Communication with Friends and Family: Once a week    Frequency of Social Gatherings with Friends and Family: Once a week    Attends Religious Services: Never    Database administrator or Organizations: No    Attends Engineer, structural: Not on file    Marital Status: Married    Tobacco Counseling Counseling given: Not Answered   Clinical Intake:  Pre-visit preparation completed: Yes  Pain : No/denies pain     BMI - recorded: 30.09 Nutritional Status: BMI > 30  Obese Nutritional Risks: None Diabetes: No  How often do you need to have someone help you when you read instructions, pamphlets, or other written materials from your doctor or pharmacy?: 1 - Never  Interpreter Needed?: No  Comments: lives with husband Information entered by :: B.Marcelina Mclaurin,LPN   Activities of Daily Living    11/25/2023    3:09 PM  In your present state of health, do you have any difficulty performing the following activities:  Hearing? 0  Vision? 0  Difficulty concentrating or making decisions? 0  Walking or climbing stairs? 0  Dressing or bathing? 0  Doing errands, shopping? 0  Preparing Food and eating ? N  Using the Toilet? N  In the past six months, have you accidently leaked urine? Y  Do you have problems with loss of bowel control? N  Managing your Medications? N  Managing your Finances? N  Housekeeping or managing your Housekeeping? Y    Patient Care Team: Doreene Nest, NP as PCP - General (Internal Medicine) Dingeldein, Viviann Spare, MD (Ophthalmology)  Indicate any recent Medical Services you may have received from other than [facility] providers in the past year (date may be approximate).     Assessment:   This is a routine wellness examination for [place].  Hearing/Vision screen Hearing Screening - Comments:: Pt says her hearing is good Vision Screening - Comments:: Pt says her vision is good   Goals Addressed             This Visit's Progress    COMPLETED: Patient Stated   On track    11/27/2020, I will maintain and continue medications as prescribed.      Patient Stated   Not on track    12/02/23-Would like to walk more and get into an exercise routine       Depression Screen    12/02/2023   10:19 AM 02/02/2023   10:59 AM 01/05/2023   11:05 AM 11/30/2022    1:07 PM 07/07/2022    2:12  PM 12/25/2021   12:34 PM 11/28/2021    2:46 PM  PHQ 2/9 Scores  PHQ - 2 Score 0 0 4 1 5 2  0  PHQ- 9  Score  3 12 3 5 3      Fall Risk    11/25/2023    3:09 PM 02/02/2023   10:59 AM 01/05/2023   11:05 AM 11/30/2022    1:14 PM 11/26/2022    4:28 PM  Fall Risk   Falls in the past year? 0 1 1  1   Number falls in past yr:  0 0 1 1  Comment    Tripped over stool once and lost balance with heavy bag.   Injury with Fall?  0 0 0 0  Risk for fall due to : No Fall Risks History of fall(s) History of fall(s) Impaired balance/gait   Follow up Education provided;Falls prevention discussed Falls evaluation completed Falls evaluation completed Falls evaluation completed;Falls prevention discussed;Education provided     MEDICARE RISK AT HOME: Medicare Risk at Home Any stairs in or around the home?: (Patient-Rptd) No Home free of loose throw rugs in walkways, pet beds, electrical cords, etc?: (Patient-Rptd) Yes Adequate lighting in your home to reduce risk of falls?: (Patient-Rptd) Yes Life alert?: (Patient-Rptd) No Use of a cane, walker or w/c?: (Patient-Rptd) No Grab bars in the bathroom?: (Patient-Rptd) Yes Shower chair or bench in shower?: (Patient-Rptd) Yes Elevated toilet seat or a handicapped toilet?: (Patient-Rptd) No  TIMED UP AND GO:  Was the test performed?  No    Cognitive Function:    11/27/2020    3:28 PM 11/02/2019   10:48 AM 10/31/2018    3:09 PM 10/28/2017    1:36 PM  MMSE - Mini Mental State Exam  Orientation to time 5 5 5 5   Orientation to Place 5 5 5 5   Registration 3 3 3 3   Attention/ Calculation 5 5 0 0  Recall 3 3 3 3   Language- name 2 objects   0 0  Language- repeat 1 1 1 1   Language- follow 3 step command   3 3  Language- read & follow direction   0 0  Write a sentence   0 0  Copy design   0 0  Total score   20 20        12/02/2023   10:23 AM 11/30/2022    1:25 PM  6CIT Screen  What Year? 0 points 0 points  What month? 0 points 0 points  What time? 0 points 0 points  Count back from 20 0 points 0 points  Months in reverse 0 points 0 points  Repeat phrase 0  points 0 points  Total Score 0 points 0 points    Immunizations Immunization History  Administered Date(s) Administered   Fluad Quad(high Dose 65+) 07/07/2022   Influenza Split 08/06/2009   Influenza, High Dose Seasonal PF 08/31/2012, 07/26/2013, 08/01/2014, 07/31/2015, 08/05/2016, 07/29/2018, 06/09/2019, 06/25/2020, 07/04/2021   Influenza,inj,Quad PF,6+ Mos 06/30/2017   Influenza-Unspecified 07/29/2018, 06/09/2019   PFIZER Comirnaty(Gray Top)Covid-19 Tri-Sucrose Vaccine 04/02/2021   PFIZER(Purple Top)SARS-COV-2 Vaccination 01/15/2020, 02/07/2020, 08/29/2020   Pneumococcal Conjugate-13 06/30/2012, 10/25/2013   Pneumococcal Polysaccharide-23 09/07/2012, 07/27/2016   Tdap 10/29/2015, 06/01/2016   Zoster Recombinant(Shingrix) 11/10/2019, 01/08/2020   Zoster, Live 02/11/2011    TDAP status: Up to date  Flu Vaccine status: Up to date  Pneumococcal vaccine status: Up to date  Covid-19 vaccine status: Completed vaccines  Qualifies for Shingles Vaccine? Yes   Zostavax completed Yes  Shingrix Completed?: Yes  Screening Tests Health Maintenance  Topic Date Due   COVID-19 Vaccine (5 - 2024-25 season) 06/20/2023   MAMMOGRAM  07/07/2024   Medicare Annual Wellness (AWV)  12/01/2024   Colonoscopy  01/03/2026   DTaP/Tdap/Td (3 - Td or Tdap) 06/01/2026   Pneumonia Vaccine 77+ Years old  Completed   INFLUENZA VACCINE  Completed   DEXA SCAN  Completed   Hepatitis C Screening  Completed   Zoster Vaccines- Shingrix  Completed   HPV VACCINES  Aged Out    Health Maintenance  Health Maintenance Due  Topic Date Due   COVID-19 Vaccine (5 - 2024-25 season) 06/20/2023    Colorectal cancer screening: No longer required.   Mammogram status: No longer required due to age.  Bone Density status: Completed 09/01/2022. Results reflect: Bone density results: NORMAL. Repeat every 5 years.  Lung Cancer Screening: (Low Dose CT Chest recommended if Age 13-80 years, 20 pack-year currently  smoking OR have quit w/in 15years.) does not qualify.   Lung Cancer Screening Referral: no  Additional Screening:  Hepatitis C Screening: does not qualify; Completed 10/20/2015  Vision Screening: Recommended annual ophthalmology exams for early detection of glaucoma and other disorders of the eye. Is the patient up to date with their annual eye exam?  Yes  Who is the provider or what is the name of the office in which the patient attends annual eye exams? Dr Dellie Burns If pt is not established with a provider, would they like to be referred to a provider to establish care? No .   Dental Screening: Recommended annual dental exams for proper oral hygiene  Diabetic Foot Exam:   Community Resource Referral / Chronic Care Management: CRR required this visit?  No   CCM required this visit?  No    Plan:     I have personally reviewed and noted the following in the patient's chart:   Medical and social history Use of alcohol, tobacco or illicit drugs  Current medications and supplements including opioid prescriptions. Patient is not currently taking opioid prescriptions. Functional ability and status Nutritional status Physical activity Advanced directives List of other physicians Hospitalizations, surgeries, and ER visits in previous 12 months Vitals Screenings to include cognitive, depression, and falls Referrals and appointments  In addition, I have reviewed and discussed with patient certain preventive protocols, quality metrics, and best practice recommendations. A written personalized care plan for preventive services as well as general preventive health recommendations were provided to patient.    Sue Lush, LPN   [id number]/40/9811   After Visit Summary: (MyChart) Due to this being a telephonic visit, the after visit summary with patients personalized plan was offered to patient via MyChart   Nurse Notes: The patient states she is doing well and has no concerns or  questions at this time.

## 2023-12-02 NOTE — Patient Instructions (Signed)
Grace Ortiz , Thank you for taking time to come for your Medicare Wellness Visit. I appreciate your ongoing commitment to your health goals. Please review the following plan we discussed and let me know if I can assist you in the future.   Referrals/Orders/Follow-Ups/Clinician Recommendations: none  This is a list of the screening recommended for you and due dates:  Health Maintenance  Topic Date Due   COVID-19 Vaccine (5 - 2024-25 season) 06/20/2023   Mammogram  07/07/2024   Medicare Annual Wellness Visit  12/01/2024   Colon Cancer Screening  01/03/2026   DTaP/Tdap/Td vaccine (3 - Td or Tdap) 06/01/2026   Pneumonia Vaccine  Completed   Flu Shot  Completed   DEXA scan (bone density measurement)  Completed   Hepatitis C Screening  Completed   Zoster (Shingles) Vaccine  Completed   HPV Vaccine  Aged Out    Advanced directives: (In Chart) A copy of your advanced directives are scanned into your chart should your provider ever need it.  Next Medicare Annual Wellness Visit scheduled for next year: Yes 12/04/2024 @ 10:10am televisit

## 2023-12-04 NOTE — Progress Notes (Unsigned)
 Cardiology Office Note  Date:  12/06/2023   ID:  Grace Ortiz, DOB May 26, 1946, MRN [id number]  PCP:  Doreene Nest, NP   Chief Complaint  Patient presents with   12 month follow up     "Doing well."     HPI:  [name] is a 78 year old woman with past medical history of Chronic fatigue Hyperlipidemia Moderate-sized hiatal hernia Right-sided aortic arch Minimal aortic atherosclerosis,  Mild to moderate proximal RCA calcification noted on CT Obesity Non-smoker, no diabetes Who presents for f/u of her coronary atherosclerosis on CT scan  LOV 2/24  On discussion today reports that she feels well Sedentary baseline, no regular exercise program Denies significant chest pain or shortness of breath on exertion  Exercise limited by back pain No leg swelling, no PND orthopnea  Tolerating Crestor 20 mg daily  Lab work reviewed 3/24 Total cholesterol 131 LDL 55 Normal BMP, CBC  Prior CT scan,  right-sided aortic arch noted, unable to exclude compression on esophagus Minimal aortic atherosclerosis with 2 speckles noted Mild to moderate proximal RCA calcification noted Moderate hiatal hernia  EKG personally reviewed by myself on todays visit EKG Interpretation Date/Time:  Monday December 06 2023 13:53:40 EST Ventricular Rate:  64 PR Interval:  148 QRS Duration:  92 QT Interval:  388 QTC Calculation: 400 R Axis:   33  Text Interpretation: Normal sinus rhythm Normal ECG When compared with ECG of 20-Aug-2020 09:50, No significant change was found Confirmed by Julien Nordmann [phone]) on 12/06/2023 1:55:31 PM    PMH:   has a past medical history of Acute UTI (08/02/2018), Arthritis, Bell's palsy, Cervical polyp, Diverticulitis, Frequent headaches, GERD (gastroesophageal reflux disease), Hyperlipidemia, Hypothyroidism, Macular retinal puckering, bilateral, Migraines, and Post covid-19 condition, unspecified (02/26/2022).  PSH:    Past Surgical History:  Procedure  Laterality Date   BLEPHAROPLASTY  2008, 2009   BREAST EXCISIONAL BIOPSY Left 05/1993   neg   BREAST EXCISIONAL BIOPSY Left 09/1993   neg   CATARACT EXTRACTION  2008   CERVICAL POLYPECTOMY  2014   COLONOSCOPY     COLONOSCOPY WITH PROPOFOL N/A 12/25/2019   Procedure: COLONOSCOPY WITH PROPOFOL;  Surgeon: Wyline Mood, MD;  Location: [facility] ENDOSCOPY;  Service: Gastroenterology;  Laterality: N/A;   COLONOSCOPY WITH PROPOFOL N/A 01/04/2023   Procedure: COLONOSCOPY WITH PROPOFOL;  Surgeon: Wyline Mood, MD;  Location: [facility] ENDOSCOPY;  Service: Gastroenterology;  Laterality: N/A;   DILATION AND CURETTAGE OF UTERUS     HYSTEROSCOPY  2014   UPPER ENDOSCOPY W/ ANTRODUODENAL MANOMETRY      Current Outpatient Medications  Medication Sig Dispense Refill   Ascorbic Acid (VITAMIN C PO) Take 1,000 Units by mouth daily.     aspirin EC 81 MG tablet Take 81 mg by mouth daily.     buPROPion (WELLBUTRIN XL) 150 MG 24 hr tablet Take 1 tablet (150 mg total) by mouth daily. For depression. 90 tablet 0   Cholecalciferol (VITAMIN D3 PO) Take 1,200 mg by mouth daily.     Cyanocobalamin (VITAMIN B12 PO) Take 1,000 mcg by mouth daily.     gabapentin (NEURONTIN) 100 MG capsule Take 1-2 capsules (100-200 mg total) by mouth at bedtime. For restless legs. 180 capsule 3   levothyroxine (SYNTHROID) 50 MCG tablet TAKE 1 TABLET BY MOUTH ON SUNDAY AND MONDAY TAKE ON EMPTY STOMACH WITH NO OTHER MEDS FOR 30 MINUTES 24 tablet 3   levothyroxine (SYNTHROID) 75 MCG tablet Take 1 tablet by mouth every morning on an empty  stomach with water only Tuesday through Saturday.  No food or other medications for 30 minutes. . 60 tablet 3   losartan (COZAAR) 25 MG tablet TAKE 1 TABLET BY MOUTH DAILY FOR KIDNEY PROTECTION 90 tablet 0   Omega-3 Fatty Acids (FISH OIL PO) Take 360 mg by mouth daily.     omeprazole (PRILOSEC) 20 MG capsule TAKE 1 CAPSULE (20 MG TOTAL) BY MOUTH DAILY. FOR HEARTBURN. 90 capsule 3   polyethylene glycol (MIRALAX /  GLYCOLAX) packet Take 17 g by mouth daily.      prochlorperazine (COMPAZINE) 10 MG tablet Take 1 tablet (10 mg total) by mouth every 8 (eight) hours as needed for nausea or vomiting. 30 tablet 0   rosuvastatin (CRESTOR) 20 MG tablet TAKE 1 TABLET BY MOUTH EVERY DAY 90 tablet 2   sertraline (ZOLOFT) 50 MG tablet TAKE 1 TABLET (50 MG TOTAL) BY MOUTH DAILY. FOR DEPRESSION. 90 tablet 2   tiZANidine (ZANAFLEX) 4 MG tablet TAKE ONE TABLET BY MOUTH EVERY 8 HOURS AS NEEDED FOR MUSCLE SPASMS 30 tablet 0   traZODone (DESYREL) 50 MG tablet TAKE ONE TABLET BY MOUTH EVERY NIGHT AT BEDTIME AS NEEDED FOR SLEEP 90 tablet 3   cycloSPORINE (RESTASIS) 0.05 % ophthalmic emulsion  (Patient not taking: Reported on 12/06/2023)     No current facility-administered medications for this visit.    Allergies:   Patient has no known allergies.   Social History:  The patient  reports that she quit smoking about 53 years ago. Her smoking use included cigarettes. She started smoking about 59 years ago. She has a 6 pack-year smoking history. She has never used smokeless tobacco. She reports current alcohol use. She reports that she does not use drugs.   Family History:   family history includes Breast cancer (age of onset: 71) in her maternal grandmother; Cancer in her father and maternal grandmother; Diabetes in her sister; Early death in her maternal grandmother and paternal grandfather; Hearing loss in her father and mother; Heart disease in her mother.    Review of Systems: Review of Systems  Constitutional: Negative.   HENT: Negative.    Respiratory: Negative.    Cardiovascular: Negative.   Gastrointestinal: Negative.   Musculoskeletal: Negative.   Neurological: Negative.   Psychiatric/Behavioral: Negative.    All other systems reviewed and are negative.   PHYSICAL EXAM: VS:  BP 122/70 (BP Location: Left Arm, Patient Position: Sitting, Cuff Size: Normal)   Pulse 64   Ht 4\' 11"  (1.499 m)   Wt 152 lb (68.9 kg)    SpO2 96%   BMI 30.70 kg/m  , BMI Body mass index is 30.7 kg/m. Constitutional:  oriented to person, place, and time. No distress.  HENT:  Head: Grossly normal Eyes:  no discharge. No scleral icterus.  Neck: No JVD, no carotid bruits  Cardiovascular: Regular rate and rhythm, no murmurs appreciated Pulmonary/Chest: Clear to auscultation bilaterally, no wheezes or rails Abdominal: Soft.  no distension.  no tenderness.  Musculoskeletal: Normal range of motion Neurological:  normal muscle tone. Coordination normal. No atrophy Skin: Skin warm and dry Psychiatric: normal affect, pleasant  Recent Labs: 01/05/2023: ALT 14; BUN 15; Creatinine, Ser 1.24; Hemoglobin 13.3; Platelets 212.0; Potassium 4.2; Sodium 142; TSH 0.55    Lipid Panel Lab Results  Component Value Date   CHOL 131 01/05/2023   HDL 64.10 01/05/2023   LDLCALC 55 01/05/2023   TRIG 62.0 01/05/2023     Wt Readings from Last 3 Encounters:  12/06/23  152 lb (68.9 kg)  12/02/23 149 lb (67.6 kg)  02/02/23 149 lb (67.6 kg)     ASSESSMENT AND PLAN:  Problem List Items Addressed This Visit       Cardiology Problems   Hyperlipidemia     Other   Chronic kidney disease (CKD), stage III (moderate)   Other Visit Diagnoses       Aortic atherosclerosis (HCC)    -  Primary   Relevant Orders   EKG 12-Lead (Completed)     Coronary artery calcification seen on CAT scan       Relevant Orders   EKG 12-Lead (Completed)     Chronic fatigue       Relevant Orders   EKG 12-Lead (Completed)      Aortic atherosclerosis Minimal atherosclerosis on prior CT scans Non-smoker, no diabetes, cholesterol at goal On aspirin  Coronary calcification Mild to moderate in the proximal RCA,  Denies anginal symptoms Cholesterol at goal on Crestor 20  Hyperlipidemia Cholesterol is at goal on the current lipid regimen. No changes to the medications were made.  Obesity Weight continues to slowly trend down Exercise limited secondary  to chronic back pain Would recommend low carbohydrate diet  Hiatal hernia Moderate in size, some indigestion Overall feels her symptoms are stable  Gait instability Recommend walking program, leg exercises for gait stability   Signed, Dossie Arbour, M.D., Ph.D. [facility] [place], [place] [phone]

## 2023-12-06 ENCOUNTER — Encounter: Payer: Self-pay | Admitting: Cardiovascular Disease

## 2023-12-06 ENCOUNTER — Ambulatory Visit: Payer: Medicare Other | Attending: Cardiovascular Disease | Admitting: Cardiovascular Disease

## 2023-12-06 VITALS — BP 122/70 | HR 64 | Ht 59.0 in | Wt 152.0 lb

## 2023-12-06 DIAGNOSIS — R5382 Chronic fatigue, unspecified: Secondary | ICD-10-CM

## 2023-12-06 DIAGNOSIS — I2510 Atherosclerotic heart disease of native coronary artery without angina pectoris: Secondary | ICD-10-CM | POA: Diagnosis not present

## 2023-12-06 DIAGNOSIS — I700 Atherosclerosis of aorta: Secondary | ICD-10-CM | POA: Diagnosis not present

## 2023-12-06 DIAGNOSIS — N183 Chronic kidney disease, stage 3 unspecified: Secondary | ICD-10-CM | POA: Diagnosis not present

## 2023-12-06 DIAGNOSIS — E782 Mixed hyperlipidemia: Secondary | ICD-10-CM

## 2023-12-06 NOTE — Patient Instructions (Signed)

## 2023-12-14 ENCOUNTER — Other Ambulatory Visit: Payer: Self-pay | Admitting: Primary Care

## 2023-12-14 DIAGNOSIS — R519 Headache, unspecified: Secondary | ICD-10-CM

## 2023-12-14 DIAGNOSIS — G8929 Other chronic pain: Secondary | ICD-10-CM

## 2023-12-14 MED ORDER — TIZANIDINE HCL 4 MG PO TABS
ORAL_TABLET | ORAL | 0 refills | Status: DC
Start: 2023-12-14 — End: 2024-08-22

## 2023-12-14 MED ORDER — PROCHLORPERAZINE MALEATE 10 MG PO TABS: 10.0000 mg | ORAL_TABLET | Freq: Three times a day (TID) | ORAL | 0 refills | Status: DC | PRN

## 2023-12-19 ENCOUNTER — Other Ambulatory Visit: Payer: Self-pay | Admitting: Primary Care

## 2023-12-19 DIAGNOSIS — F339 Major depressive disorder, recurrent, unspecified: Secondary | ICD-10-CM

## 2024-01-02 ENCOUNTER — Other Ambulatory Visit: Payer: Self-pay | Admitting: Primary Care

## 2024-01-02 DIAGNOSIS — K219 Gastro-esophageal reflux disease without esophagitis: Secondary | ICD-10-CM

## 2024-01-02 DIAGNOSIS — G4700 Insomnia, unspecified: Secondary | ICD-10-CM

## 2024-01-02 DIAGNOSIS — F339 Major depressive disorder, recurrent, unspecified: Secondary | ICD-10-CM

## 2024-01-04 ENCOUNTER — Encounter: Payer: Self-pay | Admitting: Primary Care

## 2024-01-04 ENCOUNTER — Ambulatory Visit: Payer: Medicare Other | Admitting: Primary Care

## 2024-01-04 VITALS — BP 122/78 | HR 64 | Temp 97.2°F | Ht 59.0 in | Wt 150.0 lb

## 2024-01-04 DIAGNOSIS — E785 Hyperlipidemia, unspecified: Secondary | ICD-10-CM | POA: Diagnosis not present

## 2024-01-04 DIAGNOSIS — K219 Gastro-esophageal reflux disease without esophagitis: Secondary | ICD-10-CM | POA: Diagnosis not present

## 2024-01-04 DIAGNOSIS — G2581 Restless legs syndrome: Secondary | ICD-10-CM

## 2024-01-04 DIAGNOSIS — F330 Major depressive disorder, recurrent, mild: Secondary | ICD-10-CM | POA: Diagnosis not present

## 2024-01-04 DIAGNOSIS — M5442 Lumbago with sciatica, left side: Secondary | ICD-10-CM

## 2024-01-04 DIAGNOSIS — N183 Chronic kidney disease, stage 3 unspecified: Secondary | ICD-10-CM

## 2024-01-04 DIAGNOSIS — G8929 Other chronic pain: Secondary | ICD-10-CM

## 2024-01-04 DIAGNOSIS — R519 Headache, unspecified: Secondary | ICD-10-CM

## 2024-01-04 DIAGNOSIS — E038 Other specified hypothyroidism: Secondary | ICD-10-CM | POA: Diagnosis not present

## 2024-01-04 DIAGNOSIS — G4700 Insomnia, unspecified: Secondary | ICD-10-CM | POA: Diagnosis not present

## 2024-01-04 LAB — COMPREHENSIVE METABOLIC PANEL
ALT: 12 U/L (ref 0–35)
AST: 15 U/L (ref 0–37)
Albumin: 4.1 g/dL (ref 3.5–5.2)
Alkaline Phosphatase: 73 U/L (ref 39–117)
BUN: 30 mg/dL — ABNORMAL HIGH (ref 6–23)
CO2: 29 meq/L (ref 19–32)
Calcium: 10.6 mg/dL — ABNORMAL HIGH (ref 8.4–10.5)
Chloride: 104 meq/L (ref 96–112)
Creatinine, Ser: 1.38 mg/dL — ABNORMAL HIGH (ref 0.40–1.20)
GFR: 36.89 mL/min — ABNORMAL LOW (ref >60.00)
Glucose, Bld: 97 mg/dL (ref 70–99)
Potassium: 4.6 meq/L (ref 3.5–5.1)
Sodium: 141 meq/L (ref 135–145)
Total Bilirubin: 0.5 mg/dL (ref 0.2–1.2)
Total Protein: 6.6 g/dL (ref 6.0–8.3)

## 2024-01-04 LAB — LIPID PANEL
Cholesterol: 148 mg/dL (ref 0–200)
HDL: 76.1 mg/dL (ref >39.00)
LDL Cholesterol: 60 mg/dL (ref 0–99)
NonHDL: 72.13
Total CHOL/HDL Ratio: 2
Triglycerides: 62 mg/dL (ref 0.0–149.0)
VLDL: 12.4 mg/dL (ref 0.0–40.0)

## 2024-01-04 LAB — TSH: TSH: 1.73 u[IU]/mL (ref 0.35–5.50)

## 2024-01-04 NOTE — Assessment & Plan Note (Addendum)
 Controlled.  Continue gabapentin 200 mg at bedtime and trazodone 50 mg at bedtime.

## 2024-01-04 NOTE — Assessment & Plan Note (Addendum)
Repeat renal function pending. Continue losartan 25 mg daily.

## 2024-01-04 NOTE — Assessment & Plan Note (Addendum)
 She is taking levothyroxine correctly. Repeat TSH pending.  Continue levothyroxine 75 mcg x 5 days weekly and 50 mcg x 2 days weekly.

## 2024-01-04 NOTE — Assessment & Plan Note (Signed)
 Repeat lipid panel pending. ? ?Continue rosuvastatin 20 mg daily. ?

## 2024-01-04 NOTE — Progress Notes (Signed)
 Subjective:    Patient ID: Grace Ortiz, female    DOB: 01-07-1946, 78 y.o.   MRN: [id number]  HPI  Grace Ortiz is a very pleasant 78 y.o. female with a history of hypothyroidism, chronic back pain, CKD, hyperlipidemia, frequent headaches, MDD, insomnia, restless legs who presents today for follow-up of chronic conditions.  1) GAD/MDD/Insomnia: Currently managed on bupropion XL 150 mg daily, sertraline 50 mg daily, trazodone 50 mg at bedtime.  Today she feels overall controlled. She continues to experience frustration with her husband who has dementia. She has noticed dry mouth for the last one year. One year ago she was initiated on Zoloft 50 mg. Evaluated by her dentist who suggested she suck on lemon drops.   2) CKD/Hyperlipidemia :Currently managed on losartan 25 mg daily, rosuvastatin 20 mg daily.   She denies headaches, dizziness, chest pain.   BP Readings from Last 3 Encounters:  01/04/24 122/78  12/06/23 122/70  02/02/23 130/84   3) Hypothyroidism: Currently managed on levothyroxine 75 mcg tablets 5 days weekly and levothyroxine 50 mcg tablets 2 days weekly.  She is taking levothyroxine every morning on an empty stomach with water only.   No food or other medications for 30 minutes.   No heartburn medication, iron pills, calcium, vitamin D, or magnesium pills within four hours of taking levothyroxine.   4) Restless Legs/Chronic Back Pain: Currently managed on gabapentin 100 to 200 mg at bedtime, tizanidine 4 mg as needed.  Overall feels well managed on this regimen.    Review of Systems  HENT:         Dry mouth  Respiratory:  Negative for shortness of breath.   Cardiovascular:  Negative for chest pain.  Gastrointestinal:  Negative for constipation and diarrhea.  Musculoskeletal:  Positive for back pain.  Neurological:  Negative for dizziness and headaches.  Psychiatric/Behavioral:  The patient is nervous/anxious.          Past Medical History:  Diagnosis  Date   Abdominal pain 02/05/2021   Acute UTI 08/02/2018   Arthritis    Bell's palsy    Cervical polyp    Diverticulitis    Frequent headaches    GERD (gastroesophageal reflux disease)    Hyperlipidemia    Hypothyroidism    Macular retinal puckering, bilateral    Migraines    Post covid-19 condition, unspecified 02/26/2022   Pulmonary nodule 12/11/2020    Social History   Socioeconomic History   Marital status: Married    Spouse name: Not on file   Number of children: Not on file   Years of education: Not on file   Highest education level: 12th grade  Occupational History   Not on file  Tobacco Use   Smoking status: Former    Current packs/day: 0.00    Average packs/day: 1 pack/day for 6.0 years (6.0 ttl pk-yrs)    Types: Cigarettes    Start date: 10/19/1964    Quit date: 10/19/1970    Years since quitting: 53.2   Smokeless tobacco: Never  Vaping Use   Vaping status: Never Used  Substance and Sexual Activity   Alcohol use: Yes    Comment: rarely   Drug use: No   Sexual activity: Not on file  Other Topics Concern   Not on file  Social History Narrative   Married.   No children.   Retired. Once worked as an Manufacturing engineer.    Moved from [place].   Social Drivers of Dispensing optician  Resource Strain: Low Risk  (01/29/2023)   Overall Financial Resource Strain (CARDIA)    Difficulty of Paying Living Expenses: Not hard at all  Food Insecurity: No Food Insecurity (01/29/2023)   Hunger Vital Sign    Worried About Running Out of Food in the Last Year: Never true    Ran Out of Food in the Last Year: Never true  Transportation Needs: No Transportation Needs (01/29/2023)   PRAPARE - Administrator, Civil Service (Medical): No    Lack of Transportation (Non-Medical): No  Physical Activity: Inactive (12/02/2023)   Exercise Vital Sign    Days of Exercise per Week: 0 days    Minutes of Exercise per Session: 0 min  Stress: No Stress Concern Present  (12/02/2023)   [employer] of Occupational Health - Occupational Stress Questionnaire    Feeling of Stress : Only a little  Social Connections: Socially Isolated (01/29/2023)   Social Connection and Isolation Panel [NHANES]    Frequency of Communication with Friends and Family: Once a week    Frequency of Social Gatherings with Friends and Family: Once a week    Attends Religious Services: Never    Database administrator or Organizations: No    Attends Engineer, structural: Not on file    Marital Status: Married  Catering manager Violence: Not At Risk (12/02/2023)   Humiliation, Afraid, Rape, and Kick questionnaire    Fear of Current or Ex-Partner: No    Emotionally Abused: No    Physically Abused: No    Sexually Abused: No    Past Surgical History:  Procedure Laterality Date   BLEPHAROPLASTY  2008, 2009   BREAST EXCISIONAL BIOPSY Left 05/1993   neg   BREAST EXCISIONAL BIOPSY Left 09/1993   neg   CATARACT EXTRACTION  2008   CERVICAL POLYPECTOMY  2014   COLONOSCOPY     COLONOSCOPY WITH PROPOFOL N/A 12/25/2019   Procedure: COLONOSCOPY WITH PROPOFOL;  Surgeon: Wyline Mood, MD;  Location: [facility] ENDOSCOPY;  Service: Gastroenterology;  Laterality: N/A;   COLONOSCOPY WITH PROPOFOL N/A 01/04/2023   Procedure: COLONOSCOPY WITH PROPOFOL;  Surgeon: Wyline Mood, MD;  Location: [facility] ENDOSCOPY;  Service: Gastroenterology;  Laterality: N/A;   DILATION AND CURETTAGE OF UTERUS     HYSTEROSCOPY  2014   UPPER ENDOSCOPY W/ ANTRODUODENAL MANOMETRY      Family History  Problem Relation Age of Onset   Breast cancer Maternal Grandmother 30   Cancer Maternal Grandmother        breast   Early death Maternal Grandmother    Hearing loss Mother    Heart disease Mother    Cancer Father        colon   Hearing loss Father    Diabetes Sister    Early death Paternal Grandfather     No Known Allergies  Current Outpatient Medications on File Prior to Visit  Medication Sig Dispense Refill    aspirin EC 81 MG tablet Take 81 mg by mouth daily.     buPROPion (WELLBUTRIN XL) 150 MG 24 hr tablet TAKE 1 TABLET (150 MG TOTAL) BY MOUTH DAILY. FOR DEPRESSION. 90 tablet 0   calcium-vitamin D (OSCAL WITH D) 500-5 MG-MCG tablet Take 1 tablet by mouth.     gabapentin (NEURONTIN) 100 MG capsule Take 1-2 capsules (100-200 mg total) by mouth at bedtime. For restless legs. 180 capsule 3   levothyroxine (SYNTHROID) 50 MCG tablet TAKE 1 TABLET BY MOUTH ON SUNDAY AND MONDAY TAKE ON  EMPTY STOMACH WITH NO OTHER MEDS FOR 30 MINUTES 24 tablet 3   levothyroxine (SYNTHROID) 75 MCG tablet Take 1 tablet by mouth every morning on an empty stomach with water only Tuesday through Saturday.  No food or other medications for 30 minutes. . 60 tablet 3   losartan (COZAAR) 25 MG tablet TAKE 1 TABLET BY MOUTH DAILY FOR KIDNEY PROTECTION 90 tablet 0   omeprazole (PRILOSEC) 20 MG capsule TAKE 1 CAPSULE (20 MG TOTAL) BY MOUTH DAILY. FOR HEARTBURN. 90 capsule 0   polyethylene glycol (MIRALAX / GLYCOLAX) packet Take 17 g by mouth daily.      prochlorperazine (COMPAZINE) 10 MG tablet Take 1 tablet (10 mg total) by mouth every 8 (eight) hours as needed for nausea or vomiting. 30 tablet 0   rosuvastatin (CRESTOR) 20 MG tablet TAKE 1 TABLET BY MOUTH EVERY DAY 90 tablet 2   tiZANidine (ZANAFLEX) 4 MG tablet TAKE ONE TABLET BY MOUTH EVERY 8 HOURS AS NEEDED FOR MUSCLE SPASMS 30 tablet 0   traZODone (DESYREL) 50 MG tablet TAKE 1 TABLET BY MOUTH EVERY NIGHT AT BEDTIME AS NEEDED FOR SLEEP 90 tablet 0   cycloSPORINE (RESTASIS) 0.05 % ophthalmic emulsion  (Patient not taking: Reported on 01/04/2024)     No current facility-administered medications on file prior to visit.    BP 122/78   Pulse 64   Temp (!) 97.2 F (36.2 C) (Temporal)   Ht 4\' 11"  (1.499 m)   Wt 150 lb (68 kg)   SpO2 97%   BMI 30.30 kg/m  Objective:   Physical Exam Cardiovascular:     Rate and Rhythm: Normal rate and regular rhythm.  Pulmonary:     Effort:  Pulmonary effort is normal.     Breath sounds: Normal breath sounds.  Abdominal:     General: Bowel sounds are normal.     Palpations: Abdomen is soft.     Tenderness: There is no abdominal tenderness.  Musculoskeletal:     Cervical back: Neck supple.  Skin:    General: Skin is warm and dry.  Neurological:     Mental Status: She is alert and oriented to person, place, and time.  Psychiatric:        Mood and Affect: Mood normal.           Assessment & Plan:  Hyperlipidemia, unspecified hyperlipidemia type Assessment & Plan: Repeat lipid panel pending. Continue rosuvastatin 20 mg daily.  Orders: -     Lipid panel -     Comprehensive metabolic panel  Gastroesophageal reflux disease without esophagitis Assessment & Plan: Controlled.  Continue omeprazole 20 mg daily.    Other specified hypothyroidism Assessment & Plan: She is taking levothyroxine correctly. Repeat TSH pending.  Continue levothyroxine 75 mcg x 5 days weekly and 50 mcg x 2 days weekly.    Orders: -     TSH  Chronic left-sided low back pain with left-sided sciatica Assessment & Plan: Stable.  Continue gabapentin 200 mg HS, tizanidine 4 mg HS PRN   Stage 3 chronic kidney disease, unspecified whether stage 3a or 3b CKD (HCC) Assessment & Plan: Repeat renal function pending. Continue losartan 25 mg daily.   Frequent headaches Assessment & Plan: Stable.  Continue Compazine 10 mg as needed   Insomnia, unspecified type Assessment & Plan: Controlled.  Continue gabapentin 200 mg at bedtime and trazodone 50 mg at bedtime.   Mild episode of recurrent major depressive disorder (HCC) Assessment & Plan: Overall stable.  Xerostomia is  a known side effect of antidepressant medicines.  Since xerostomia began shortly after initiation of Zoloft, will wean off Zoloft to see if this helps to improve her xerostomia.  She will also update regarding her depression symptoms.  She agrees to the  plan.  Continue bupropion XL 150 mg daily.   Restless legs Assessment & Plan: Deteriorated.  Increase gabapentin to 200 mg at bedtime.  She will update.         Doreene Nest, NP

## 2024-01-04 NOTE — Assessment & Plan Note (Signed)
 Stable.  Continue Compazine 10 mg as needed

## 2024-01-04 NOTE — Assessment & Plan Note (Signed)
Controlled.   Continue omeprazole 20 mg daily. 

## 2024-01-04 NOTE — Assessment & Plan Note (Signed)
 Deteriorated.  Increase gabapentin to 200 mg at bedtime.  She will update.

## 2024-01-04 NOTE — Assessment & Plan Note (Signed)
 Stable.  Continue gabapentin 200 mg HS, tizanidine 4 mg HS PRN

## 2024-01-04 NOTE — Assessment & Plan Note (Signed)
 Overall stable.  Xerostomia is a known side effect of antidepressant medicines.  Since xerostomia began shortly after initiation of Zoloft, will wean off Zoloft to see if this helps to improve her xerostomia.  She will also update regarding her depression symptoms.  She agrees to the plan.  Continue bupropion XL 150 mg daily.

## 2024-01-04 NOTE — Patient Instructions (Addendum)
 Stop by the lab prior to leaving today. I will notify you of your results once received.   Wean off of your Zoloft medication due to dry mouth. Take 1/2 tablet daily for 2 weeks, then stop.   Ask [facility] about your last Pneumonia vaccine. You should get a Prevnar 20.  Keep me updated!  It was a pleasure to see you today!

## 2024-01-11 DIAGNOSIS — Z23 Encounter for immunization: Secondary | ICD-10-CM | POA: Diagnosis not present

## 2024-02-21 ENCOUNTER — Other Ambulatory Visit: Payer: Self-pay | Admitting: Primary Care

## 2024-02-21 DIAGNOSIS — N183 Chronic kidney disease, stage 3 unspecified: Secondary | ICD-10-CM

## 2024-02-26 ENCOUNTER — Other Ambulatory Visit: Payer: Self-pay | Admitting: Primary Care

## 2024-02-26 DIAGNOSIS — G2581 Restless legs syndrome: Secondary | ICD-10-CM

## 2024-03-03 ENCOUNTER — Other Ambulatory Visit: Payer: Self-pay | Admitting: Primary Care

## 2024-03-03 DIAGNOSIS — E038 Other specified hypothyroidism: Secondary | ICD-10-CM

## 2024-03-14 ENCOUNTER — Other Ambulatory Visit: Payer: Self-pay | Admitting: Primary Care

## 2024-03-14 DIAGNOSIS — F339 Major depressive disorder, recurrent, unspecified: Secondary | ICD-10-CM

## 2024-03-30 ENCOUNTER — Other Ambulatory Visit: Payer: Self-pay | Admitting: Primary Care

## 2024-03-30 DIAGNOSIS — K219 Gastro-esophageal reflux disease without esophagitis: Secondary | ICD-10-CM

## 2024-03-30 DIAGNOSIS — G4700 Insomnia, unspecified: Secondary | ICD-10-CM

## 2024-03-30 DIAGNOSIS — F339 Major depressive disorder, recurrent, unspecified: Secondary | ICD-10-CM

## 2024-04-08 ENCOUNTER — Other Ambulatory Visit: Payer: Self-pay | Admitting: Cardiovascular Disease

## 2024-04-08 ENCOUNTER — Other Ambulatory Visit: Payer: Self-pay | Admitting: Primary Care

## 2024-04-08 DIAGNOSIS — F339 Major depressive disorder, recurrent, unspecified: Secondary | ICD-10-CM

## 2024-04-12 DIAGNOSIS — Z961 Presence of intraocular lens: Secondary | ICD-10-CM | POA: Diagnosis not present

## 2024-04-12 DIAGNOSIS — H43812 Vitreous degeneration, left eye: Secondary | ICD-10-CM | POA: Diagnosis not present

## 2024-04-12 DIAGNOSIS — H16223 Keratoconjunctivitis sicca, not specified as Sjogren's, bilateral: Secondary | ICD-10-CM | POA: Diagnosis not present

## 2024-04-18 DIAGNOSIS — E038 Other specified hypothyroidism: Secondary | ICD-10-CM

## 2024-04-18 MED ORDER — LEVOTHYROXINE SODIUM 75 MCG PO TABS: ORAL_TABLET | ORAL | 1 refills | Status: DC

## 2024-04-20 ENCOUNTER — Encounter: Payer: Self-pay | Admitting: Primary Care

## 2024-04-20 ENCOUNTER — Ambulatory Visit (INDEPENDENT_AMBULATORY_CARE_PROVIDER_SITE_OTHER): Admitting: Primary Care

## 2024-04-20 VITALS — BP 110/72 | HR 87 | Temp 98.1°F | Ht 59.0 in | Wt 152.0 lb

## 2024-04-20 DIAGNOSIS — M2550 Pain in unspecified joint: Secondary | ICD-10-CM

## 2024-04-20 DIAGNOSIS — R053 Chronic cough: Secondary | ICD-10-CM | POA: Diagnosis not present

## 2024-04-20 DIAGNOSIS — G4700 Insomnia, unspecified: Secondary | ICD-10-CM | POA: Diagnosis not present

## 2024-04-20 DIAGNOSIS — G8929 Other chronic pain: Secondary | ICD-10-CM | POA: Diagnosis not present

## 2024-04-20 DIAGNOSIS — K219 Gastro-esophageal reflux disease without esophagitis: Secondary | ICD-10-CM

## 2024-04-20 DIAGNOSIS — F339 Major depressive disorder, recurrent, unspecified: Secondary | ICD-10-CM

## 2024-04-20 DIAGNOSIS — G2581 Restless legs syndrome: Secondary | ICD-10-CM

## 2024-04-20 MED ORDER — FLUOXETINE HCL 20 MG PO CAPS
20.0000 mg | ORAL_CAPSULE | Freq: Every day | ORAL | 0 refills | Status: DC
Start: 2024-04-20 — End: 2024-05-23

## 2024-04-20 NOTE — Assessment & Plan Note (Signed)
 Increase omeprazole  to 20 mg twice daily given chronic cough. She will update.

## 2024-04-20 NOTE — Assessment & Plan Note (Signed)
 Differentials most likely allergies/postnasal drip or GERD, or both.  We discussed to start taking Allegra every day.  Consider switching to Zyrtec. Increase omeprazole  to 40 mg daily.  She will update.

## 2024-04-20 NOTE — Progress Notes (Signed)
 Subjective:    Patient ID: Grace Ortiz, female    DOB: 06-25-1946, 78 y.o.   MRN: [id number]  Depression        Associated symptoms include insomnia. Insomnia Primary symptoms: sleep disturbance.   PMH includes: depression.     Grace Ortiz is a very pleasant 78 y.o. female with a history of GERD, hypothyroidism, CKD, hyperlipidemia, insomnia who presents today to discuss multiple concerns.  1) Depression/Insomnia: Currently managed on bupropion  150 mg daily for depression and trazodone  50 mg at bedtime for sleep.   Previously managed on Zoloft  but she experienced side effects of dry mouth, and Cymbalta  which was ineffective.  She has been managed on Wellbutrin  XL 300 or 150 mg daily since at least 2018. Doesn't feel it works well now.   Over the last several months she's felt more depressed, has little motivation to do anything, has a hard time doing household chores, is frustrated with her ongoing joint pain.   She has difficulty falling and staying asleep due to her restless legs. If her legs are calm then she rests better. She is managed on gabapentin  200 mg HS which has been ineffective.   2) Chronic Joint Pain: Chronic to the lower neck, wrists, hands, fingers, lower back, knees for years. She is taking her gabapentin  200 mg HS for restless legs which is not effective. She does not take Tylenol  or Ibuprofen often. She is mostly sedentary during the day. She has never been tested for autoimmune disease.   3) Chronic Cough: Chronic post nasal drip. She admits her home is quite dusty. She does not take an antihistamine. Her cough is mostly felt from the throat, not the chest. She is managed on omeprazole  20 mg daily, does experience breakthrough symptoms of GERD on occasion. She denies feeling sick, fevers, etc. She quit smoking in 1972.  Review of Systems  HENT:  Positive for postnasal drip.   Musculoskeletal:  Positive for arthralgias and back pain.  Psychiatric/Behavioral:   Positive for depression and sleep disturbance. The patient has insomnia. The patient is not nervous/anxious.        See HPI         Past Medical History:  Diagnosis Date   Abdominal pain 02/05/2021   Acute UTI 08/02/2018   Arthritis    Bell's palsy    Cervical polyp    Diverticulitis    Frequent headaches    GERD (gastroesophageal reflux disease)    Hyperlipidemia    Hypothyroidism    Macular retinal puckering, bilateral    Migraines    Post covid-19 condition, unspecified 02/26/2022   Pulmonary nodule 12/11/2020    Social History   Socioeconomic History   Marital status: Married    Spouse name: Not on file   Number of children: Not on file   Years of education: Not on file   Highest education level: 12th grade  Occupational History   Not on file  Tobacco Use   Smoking status: Former    Current packs/day: 0.00    Average packs/day: 1 pack/day for 6.0 years (6.0 ttl pk-yrs)    Types: Cigarettes    Start date: 10/19/1964    Quit date: 10/19/1970    Years since quitting: 53.5   Smokeless tobacco: Never  Vaping Use   Vaping status: Never Used  Substance and Sexual Activity   Alcohol use: Yes    Comment: rarely   Drug use: No   Sexual activity: Not on file  Other Topics  Concern   Not on file  Social History Narrative   Married.   No children.   Retired. Once worked as an Manufacturing engineer.    Moved from [place] .   Social Drivers of Corporate investment banker Strain: Low Risk  (04/16/2024)   Overall Financial Resource Strain (CARDIA)    Difficulty of Paying Living Expenses: Not hard at all  Food Insecurity: No Food Insecurity (04/16/2024)   Hunger Vital Sign    Worried About Running Out of Food in the Last Year: Never true    Ran Out of Food in the Last Year: Never true  Transportation Needs: No Transportation Needs (04/16/2024)   PRAPARE - Administrator, Civil Service (Medical): No    Lack of Transportation (Non-Medical): No  Physical  Activity: Inactive (04/16/2024)   Exercise Vital Sign    Days of Exercise per Week: 0 days    Minutes of Exercise per Session: Not on file  Stress: Stress Concern Present (04/16/2024)   [employer] of Occupational Health - Occupational Stress Questionnaire    Feeling of Stress: To some extent  Social Connections: Unknown (04/16/2024)   Social Connection and Isolation Panel    Frequency of Communication with Friends and Family: Once a week    Frequency of Social Gatherings with Friends and Family: Once a week    Attends Religious Services: Never    Database administrator or Organizations: Not on file    Attends Banker Meetings: Not on file    Marital Status: Married  Intimate Partner Violence: Not At Risk (12/02/2023)   Humiliation, Afraid, Rape, and Kick questionnaire    Fear of Current or Ex-Partner: No    Emotionally Abused: No    Physically Abused: No    Sexually Abused: No    Past Surgical History:  Procedure Laterality Date   BLEPHAROPLASTY  2008, 2009   BREAST EXCISIONAL BIOPSY Left 05/1993   neg   BREAST EXCISIONAL BIOPSY Left 09/1993   neg   CATARACT EXTRACTION  2008   CERVICAL POLYPECTOMY  2014   COLONOSCOPY     COLONOSCOPY WITH PROPOFOL  N/A 12/25/2019   Procedure: COLONOSCOPY WITH PROPOFOL ;  Surgeon: Therisa Bi, MD;  Location: [facility] ENDOSCOPY;  Service: Gastroenterology;  Laterality: N/A;   COLONOSCOPY WITH PROPOFOL  N/A 01/04/2023   Procedure: COLONOSCOPY WITH PROPOFOL ;  Surgeon: Therisa Bi, MD;  Location: [facility] ENDOSCOPY;  Service: Gastroenterology;  Laterality: N/A;   DILATION AND CURETTAGE OF UTERUS     HYSTEROSCOPY  2014   UPPER ENDOSCOPY W/ ANTRODUODENAL MANOMETRY      Family History  Problem Relation Age of Onset   Breast cancer Maternal Grandmother 30   Cancer Maternal Grandmother        breast   Early death Maternal Grandmother    Hearing loss Mother    Heart disease Mother    Cancer Father        colon   Hearing loss Father     Diabetes Sister    Early death Paternal Grandfather     No Known Allergies  Current Outpatient Medications on File Prior to Visit  Medication Sig Dispense Refill   aspirin  EC 81 MG tablet Take 81 mg by mouth daily.     calcium -vitamin D (OSCAL WITH D) 500-5 MG-MCG tablet Take 1 tablet by mouth.     gabapentin  (NEURONTIN ) 100 MG capsule TAKE 1-2 CAPSULES BY MOUTH AT BEDTIME FOR RESTLESS LEGS 180 capsule 2   levothyroxine  (SYNTHROID ) 50  MCG tablet TAKE 1 TABLET BY MOUTH ON SUNDAY AND MONDAY TAKE ON EMPTY STOMACH WITH NO OTHER MEDS FOR 30 MINUTES 24 tablet 2   levothyroxine  (SYNTHROID ) 75 MCG tablet Take 1 tablet by mouth every morning on an empty stomach with water only Tuesday through Saturday.  No food or other medications for 30 minutes. . 60 tablet 1   losartan  (COZAAR ) 25 MG tablet TAKE 1 TABLET BY MOUTH DAILY FOR KIDNEY PROTECTION 90 tablet 2   omeprazole  (PRILOSEC) 20 MG capsule TAKE 1 CAPSULE BY MOUTH DAILY FOR HEARTBURN 90 capsule 2   polyethylene glycol (MIRALAX / GLYCOLAX) packet Take 17 g by mouth daily.      prochlorperazine  (COMPAZINE ) 10 MG tablet Take 1 tablet (10 mg total) by mouth every 8 (eight) hours as needed for nausea or vomiting. 30 tablet 0   rosuvastatin  (CRESTOR ) 20 MG tablet TAKE 1 TABLET BY MOUTH EVERY DAY 90 tablet 2   tiZANidine  (ZANAFLEX ) 4 MG tablet TAKE ONE TABLET BY MOUTH EVERY 8 HOURS AS NEEDED FOR MUSCLE SPASMS 30 tablet 0   traZODone  (DESYREL ) 50 MG tablet TAKE 1 TABLET BY MOUTH EVERY NIGHT AT BEDTIME AS NEEDED FOR SLEEP 90 tablet 2   No current facility-administered medications on file prior to visit.    BP 110/72   Pulse 87   Temp 98.1 F (36.7 C) (Temporal)   Ht 4' 11 (1.499 m)   Wt 152 lb (68.9 kg)   SpO2 96%   BMI 30.70 kg/m  Objective:   Physical Exam HENT:     Mouth/Throat:     Mouth: Mucous membranes are moist.     Comments: Patient clearing her throat multiple times during visit Cardiovascular:     Rate and Rhythm: Normal rate and  regular rhythm.  Pulmonary:     Effort: Pulmonary effort is normal.     Breath sounds: Normal breath sounds.  Musculoskeletal:     Cervical back: Neck supple.  Skin:    General: Skin is warm and dry.  Neurological:     Mental Status: She is alert and oriented to person, place, and time.  Psychiatric:        Mood and Affect: Mood normal.           Assessment & Plan:  Chronic joint pain Assessment & Plan: Likely osteoarthritis, but will rule out rheumatoid arthritis and gout.  Labs pending today for CCP, RF, sed rate, uric acid. Increasing gabapentin  for restless legs which may in turn help with joint pain.  She will update.  Orders: -     Uric acid -     Basic metabolic panel with GFR -     C-reactive protein -     Cyclic citrul peptide antibody, IgG -     Rheumatoid factor -     Sedimentation rate  Episode of recurrent major depressive disorder, unspecified depression episode severity (HCC) Assessment & Plan: Uncontrolled.  Discontinue Wellbutrin  XL 150 mg as this seems ineffective.  Start fluoxetine 20 mg daily. Follow-up in 4 weeks.  Orders: -     FLUoxetine HCl; Take 1 capsule (20 mg total) by mouth daily. For depression  Dispense: 90 capsule; Refill: 0  Gastroesophageal reflux disease without esophagitis Assessment & Plan: Increase omeprazole  to 20 mg twice daily given chronic cough. She will update.   Insomnia, unspecified type Assessment & Plan: Increase gabapentin  to 300 mg and titrate slowly upward from there. She will update.  Continue trazodone  50 mg at bedtime  for now.   Restless legs Assessment & Plan: Uncontrolled.  Increase gabapentin  to 300 mg at bedtime and slowly titrate upward from there. She will update via MyChart.   Chronic cough Assessment & Plan: Differentials most likely allergies/postnasal drip or GERD, or both.  We discussed to start taking Allegra every day.  Consider switching to Zyrtec. Increase omeprazole  to 40  mg daily.  She will update.         Shaheed Schmuck K Beautifull Cisar, NP

## 2024-04-20 NOTE — Assessment & Plan Note (Signed)
 Uncontrolled.  Discontinue Wellbutrin  XL 150 mg as this seems ineffective.  Start fluoxetine 20 mg daily. Follow-up in 4 weeks.

## 2024-04-20 NOTE — Assessment & Plan Note (Signed)
 Increase gabapentin  to 300 mg and titrate slowly upward from there. She will update.  Continue trazodone  50 mg at bedtime for now.

## 2024-04-20 NOTE — Assessment & Plan Note (Signed)
 Likely osteoarthritis, but will rule out rheumatoid arthritis and gout.  Labs pending today for CCP, RF, sed rate, uric acid. Increasing gabapentin  for restless legs which may in turn help with joint pain.  She will update.

## 2024-04-20 NOTE — Assessment & Plan Note (Signed)
 Uncontrolled.  Increase gabapentin  to 300 mg at bedtime and slowly titrate upward from there. She will update via MyChart.

## 2024-04-20 NOTE — Patient Instructions (Signed)
 Increase your gabapentin  to 300 mg at bedtime for restless legs and joint pain.  Please update me in a few nights.  Stop taking bupropion  XL 150 mg daily for depression.  Start fluoxetine 20 mg once daily for depression.  Increase your omeprazole  to 20 mg twice daily or 40 mg once daily for cough.  Start taking Allegra every day for drainage.  Please schedule a follow-up visit in 1 month.

## 2024-04-22 ENCOUNTER — Ambulatory Visit: Payer: Self-pay | Admitting: Primary Care

## 2024-04-22 LAB — BASIC METABOLIC PANEL WITH GFR
BUN/Creatinine Ratio: 14 (calc) (ref 6–22)
BUN: 18 mg/dL (ref 7–25)
CO2: 29 mmol/L (ref 20–32)
Calcium: 10 mg/dL (ref 8.6–10.4)
Chloride: 108 mmol/L (ref 98–110)
Creat: 1.33 mg/dL — ABNORMAL HIGH (ref 0.60–1.00)
Glucose, Bld: 103 mg/dL — ABNORMAL HIGH (ref 65–99)
Potassium: 4.3 mmol/L (ref 3.5–5.3)
Sodium: 141 mmol/L (ref 135–146)
eGFR: 41 mL/min/1.73m2 — ABNORMAL LOW (ref >=60)

## 2024-04-22 LAB — C-REACTIVE PROTEIN: CRP: <3 mg/L (ref <8.0)

## 2024-04-22 LAB — RHEUMATOID FACTOR: Rheumatoid fact SerPl-aCnc: <10 [IU]/mL (ref <14)

## 2024-04-22 LAB — SEDIMENTATION RATE: Sed Rate: 9 mm/h (ref 0–30)

## 2024-04-22 LAB — URIC ACID: Uric Acid, Serum: 5.5 mg/dL (ref 2.5–7.0)

## 2024-04-22 LAB — CYCLIC CITRUL PEPTIDE ANTIBODY, IGG: Cyclic Citrullin Peptide Ab: <16 U

## 2024-05-17 DIAGNOSIS — G2581 Restless legs syndrome: Secondary | ICD-10-CM

## 2024-05-17 MED ORDER — GABAPENTIN 100 MG PO CAPS
ORAL_CAPSULE | ORAL | 0 refills | Status: DC
Start: 2024-05-17 — End: 2024-06-18

## 2024-05-23 ENCOUNTER — Ambulatory Visit (INDEPENDENT_AMBULATORY_CARE_PROVIDER_SITE_OTHER): Admitting: Primary Care

## 2024-05-23 ENCOUNTER — Encounter: Payer: Self-pay | Admitting: Primary Care

## 2024-05-23 VITALS — BP 132/74 | HR 79 | Temp 97.2°F | Ht 59.0 in | Wt 154.0 lb

## 2024-05-23 DIAGNOSIS — G4700 Insomnia, unspecified: Secondary | ICD-10-CM | POA: Diagnosis not present

## 2024-05-23 DIAGNOSIS — F330 Major depressive disorder, recurrent, mild: Secondary | ICD-10-CM | POA: Diagnosis not present

## 2024-05-23 MED ORDER — FLUOXETINE HCL 40 MG PO CAPS
40.0000 mg | ORAL_CAPSULE | Freq: Every day | ORAL | 0 refills | Status: DC
Start: 2024-05-23 — End: 2024-08-20

## 2024-05-23 NOTE — Assessment & Plan Note (Signed)
 Slight improvement, not at goal.  Increase fluoxetine  to 40 mg daily, she agrees.  Follow up in 1 month.

## 2024-05-23 NOTE — Assessment & Plan Note (Addendum)
 She seems to have her days and nights mixed up. It's possible that her gabapentin  is causing daytime drowsiness.   Stop gabapentin  in HS, start 200 mg AM.  Continue Trazodone  50 mg HS PRN.   She will work to avoid daytime napping.   Follow up in 1 month.

## 2024-05-23 NOTE — Patient Instructions (Signed)
 Take Trazodone  50 mg at bedtime.  Take 2-3 gabapentin  in the morning.   We increased your dose of fluoxetine  to 40 mg once daily for depression.   Please schedule a follow up visit for 1 month.  It was a pleasure to see you today!

## 2024-05-23 NOTE — Progress Notes (Signed)
 Subjective:    Patient ID: Grace Ortiz, female    DOB: 1945-12-20, 78 y.o.   MRN: [id number]  HPI  Grace Ortiz is a very pleasant 78 y.o. female with a history of hypothyroidism, CKD, MDD, hyperlipidemia, restless legs who presents today for follow-up of depression.  She was last evaluated on 04/20/2024.  During this visit we discussed ongoing depression despite management on bupropion  150 mg daily trazodone  50 mg at bedtime.  Symptoms included little motivation to do anything, feeling more down and depressed, difficulty falling and staying asleep.  During this visit we increased her gabapentin  to 300 mg at bedtime for restless legs and continued her trazodone  50 mg at bedtime.  We discontinued Wellbutrin  XL 150 mg as it seemed ineffective and initiated fluoxetine  20 mg daily.  She is here for follow-up today.  Since her last visit she continues to experience difficulty falling asleep. She is waking around 11 am, goes to bed around 2 am, will nap intermittently throughout the day. When she tries to go to bed earlier she will have difficulty falling asleep. Prior to bedtime she has the TV on sometimes, mostly reads in bed.   She's feeling less down overall, she continues to have low motivation to do anything. She denies GI upset.    Review of Systems  Respiratory:  Negative for shortness of breath.   Cardiovascular:  Negative for chest pain.  Neurological:  Negative for headaches.  Psychiatric/Behavioral:  The patient is nervous/anxious.        See HPI         Past Medical History:  Diagnosis Date   Abdominal pain 02/05/2021   Acute UTI 08/02/2018   Arthritis    Bell's palsy    Cervical polyp    Diverticulitis    Frequent headaches    GERD (gastroesophageal reflux disease)    Hyperlipidemia    Hypothyroidism    Macular retinal puckering, bilateral    Migraines    Post covid-19 condition, unspecified 02/26/2022   Pulmonary nodule 12/11/2020    Social History    Socioeconomic History   Marital status: Married    Spouse name: Not on file   Number of children: Not on file   Years of education: Not on file   Highest education level: 12th grade  Occupational History   Not on file  Tobacco Use   Smoking status: Former    Current packs/day: 0.00    Average packs/day: 1 pack/day for 6.0 years (6.0 ttl pk-yrs)    Types: Cigarettes    Start date: 10/19/1964    Quit date: 10/19/1970    Years since quitting: 53.6   Smokeless tobacco: Never  Vaping Use   Vaping status: Never Used  Substance and Sexual Activity   Alcohol use: Yes    Comment: rarely   Drug use: No   Sexual activity: Not on file  Other Topics Concern   Not on file  Social History Narrative   Married.   No children.   Retired. Once worked as an Manufacturing engineer.    Moved from [place] .   Social Drivers of Corporate investment banker Strain: Low Risk  (04/16/2024)   Overall Financial Resource Strain (CARDIA)    Difficulty of Paying Living Expenses: Not hard at all  Food Insecurity: No Food Insecurity (04/16/2024)   Hunger Vital Sign    Worried About Running Out of Food in the Last Year: Never true    Ran Out of Food in the  Last Year: Never true  Transportation Needs: No Transportation Needs (04/16/2024)   PRAPARE - Administrator, Civil Service (Medical): No    Lack of Transportation (Non-Medical): No  Physical Activity: Inactive (04/16/2024)   Exercise Vital Sign    Days of Exercise per Week: 0 days    Minutes of Exercise per Session: Not on file  Stress: Stress Concern Present (04/16/2024)   [employer] of Occupational Health - Occupational Stress Questionnaire    Feeling of Stress: To some extent  Social Connections: Unknown (04/16/2024)   Social Connection and Isolation Panel    Frequency of Communication with Friends and Family: Once a week    Frequency of Social Gatherings with Friends and Family: Once a week    Attends Religious Services: Never     Database administrator or Organizations: Not on file    Attends Banker Meetings: Not on file    Marital Status: Married  Intimate Partner Violence: Not At Risk (12/02/2023)   Humiliation, Afraid, Rape, and Kick questionnaire    Fear of Current or Ex-Partner: No    Emotionally Abused: No    Physically Abused: No    Sexually Abused: No    Past Surgical History:  Procedure Laterality Date   BLEPHAROPLASTY  2008, 2009   BREAST EXCISIONAL BIOPSY Left 05/1993   neg   BREAST EXCISIONAL BIOPSY Left 09/1993   neg   CATARACT EXTRACTION  2008   CERVICAL POLYPECTOMY  2014   COLONOSCOPY     COLONOSCOPY WITH PROPOFOL  N/A 12/25/2019   Procedure: COLONOSCOPY WITH PROPOFOL ;  Surgeon: Therisa Bi, MD;  Location: [facility] ENDOSCOPY;  Service: Gastroenterology;  Laterality: N/A;   COLONOSCOPY WITH PROPOFOL  N/A 01/04/2023   Procedure: COLONOSCOPY WITH PROPOFOL ;  Surgeon: Therisa Bi, MD;  Location: [facility] ENDOSCOPY;  Service: Gastroenterology;  Laterality: N/A;   DILATION AND CURETTAGE OF UTERUS     HYSTEROSCOPY  2014   UPPER ENDOSCOPY W/ ANTRODUODENAL MANOMETRY      Family History  Problem Relation Age of Onset   Breast cancer Maternal Grandmother 30   Cancer Maternal Grandmother        breast   Early death Maternal Grandmother    Hearing loss Mother    Heart disease Mother    Cancer Father        colon   Hearing loss Father    Diabetes Sister    Early death Paternal Grandfather     No Known Allergies  Current Outpatient Medications on File Prior to Visit  Medication Sig Dispense Refill   aspirin  EC 81 MG tablet Take 81 mg by mouth daily.     calcium -vitamin D (OSCAL WITH D) 500-5 MG-MCG tablet Take 1 tablet by mouth.     gabapentin  (NEURONTIN ) 100 MG capsule Take 2 to 3 capsules by mouth at bedtime for joint pain and restless legs. 90 capsule 0   levothyroxine  (SYNTHROID ) 50 MCG tablet TAKE 1 TABLET BY MOUTH ON SUNDAY AND MONDAY TAKE ON EMPTY STOMACH WITH NO OTHER MEDS FOR 30  MINUTES 24 tablet 2   levothyroxine  (SYNTHROID ) 75 MCG tablet Take 1 tablet by mouth every morning on an empty stomach with water only Tuesday through Saturday.  No food or other medications for 30 minutes. . 60 tablet 1   losartan  (COZAAR ) 25 MG tablet TAKE 1 TABLET BY MOUTH DAILY FOR KIDNEY PROTECTION 90 tablet 2   omeprazole  (PRILOSEC) 20 MG capsule TAKE 1 CAPSULE BY MOUTH DAILY FOR HEARTBURN 90  capsule 2   polyethylene glycol (MIRALAX / GLYCOLAX) packet Take 17 g by mouth daily.      prochlorperazine  (COMPAZINE ) 10 MG tablet Take 1 tablet (10 mg total) by mouth every 8 (eight) hours as needed for nausea or vomiting. 30 tablet 0   rosuvastatin  (CRESTOR ) 20 MG tablet TAKE 1 TABLET BY MOUTH EVERY DAY 90 tablet 2   tiZANidine  (ZANAFLEX ) 4 MG tablet TAKE ONE TABLET BY MOUTH EVERY 8 HOURS AS NEEDED FOR MUSCLE SPASMS 30 tablet 0   traZODone  (DESYREL ) 50 MG tablet TAKE 1 TABLET BY MOUTH EVERY NIGHT AT BEDTIME AS NEEDED FOR SLEEP 90 tablet 2   No current facility-administered medications on file prior to visit.    BP 132/74   Pulse 79   Temp (!) 97.2 F (36.2 C) (Temporal)   Ht 4' 11 (1.499 m)   Wt 154 lb (69.9 kg)   SpO2 92%   BMI 31.10 kg/m  Objective:   Physical Exam Cardiovascular:     Rate and Rhythm: Normal rate and regular rhythm.  Pulmonary:     Effort: Pulmonary effort is normal.     Breath sounds: Normal breath sounds.  Musculoskeletal:     Cervical back: Neck supple.  Skin:    General: Skin is warm and dry.  Neurological:     Mental Status: She is alert and oriented to person, place, and time.  Psychiatric:        Mood and Affect: Mood normal.           Assessment & Plan:  Mild episode of recurrent major depressive disorder (HCC) Assessment & Plan: Slight improvement, not at goal.  Increase fluoxetine  to 40 mg daily, she agrees.  Follow up in 1 month.   Orders: -     FLUoxetine  HCl; Take 1 capsule (40 mg total) by mouth daily. For depression  Dispense: 90  capsule; Refill: 0  Insomnia, unspecified type Assessment & Plan: She seems to have her days and nights mixed up. It's possible that her gabapentin  is causing daytime drowsiness.   Stop gabapentin  in HS, start 200 mg AM.  Continue Trazodone  50 mg HS PRN.   She will work to avoid daytime napping.   Follow up in 1 month.         Rayni Nemitz K Nahsir Venezia, NP

## 2024-05-25 ENCOUNTER — Other Ambulatory Visit: Payer: Self-pay | Admitting: Primary Care

## 2024-05-25 DIAGNOSIS — Z1231 Encounter for screening mammogram for malignant neoplasm of breast: Secondary | ICD-10-CM

## 2024-06-07 DIAGNOSIS — E2839 Other primary ovarian failure: Secondary | ICD-10-CM

## 2024-06-18 ENCOUNTER — Other Ambulatory Visit: Payer: Self-pay | Admitting: Primary Care

## 2024-06-18 DIAGNOSIS — G2581 Restless legs syndrome: Secondary | ICD-10-CM

## 2024-06-22 ENCOUNTER — Ambulatory Visit: Admitting: Primary Care

## 2024-06-22 ENCOUNTER — Encounter: Payer: Self-pay | Admitting: Primary Care

## 2024-06-22 VITALS — BP 102/60 | HR 68 | Temp 96.5°F | Ht 59.0 in | Wt 150.0 lb

## 2024-06-22 DIAGNOSIS — F330 Major depressive disorder, recurrent, mild: Secondary | ICD-10-CM

## 2024-06-22 DIAGNOSIS — G4700 Insomnia, unspecified: Secondary | ICD-10-CM | POA: Diagnosis not present

## 2024-06-22 NOTE — Assessment & Plan Note (Addendum)
 Slight improvement.   We had a long discussion regarding options and whether to continue on fluoxetine  40 mg vs increasing dose vs trying another option. We decided to continue her current regimen as her symptoms are situational based on her husbands actions for the day.  Continue fluoxetine  40 mg daily.  Continue Trazodone  50 mg HS.  Referral placed for therapy.

## 2024-06-22 NOTE — Assessment & Plan Note (Signed)
 Improved.  Will move gabapentin  100 mg back to HS. Continue Trazodone  50 mg HS.

## 2024-06-22 NOTE — Progress Notes (Signed)
 Subjective:    Patient ID: Grace Ortiz, female    DOB: Feb 04, 1946, 78 y.o.   MRN: [id number]  Grace Ortiz is a very pleasant 78 y.o. female with a history of MDD, restless legs, sleep disturbance, who presents today for follow-up of depression.  She was last evaluated on 05/23/2024 for follow-up of depression.  During this visit she continued to experience difficulty falling asleep.  She was waking around 11 AM, napping throughout the day, staying awake until 2 AM.  She did express feeling less down but continues to have low motivation to do anything despite management on fluoxetine  20 mg daily.  During this visit we increased her fluoxetine  40 mg daily and changed her gabapentin  to 100 mg to AM dosing.  Her trazodone  50 mg was continued.  She is here for follow-up today.  Since her last visit she is compliant to fluoxetine  40 mg daily. She has noticed feeling less down but still has little motivation to do things. Her husband has dementia so that has been frustrating and sad. Her symptoms are largely dependant on her husband's behavior and mood for the day.  She is taking gabapentin  100 mg in the morning, Trazodone  50 mg HS. She is sleeping better. Has been going to bed from 11 pm to 1-2 am, less napping during the day overall. She has not met with therapy.    Review of Systems  Gastrointestinal:  Negative for nausea.  Neurological:  Negative for headaches.  Psychiatric/Behavioral:  Positive for sleep disturbance. Negative for suicidal ideas.        See HPI         Past Medical History:  Diagnosis Date   Abdominal pain 02/05/2021   Acute UTI 08/02/2018   Arthritis    Bell's palsy    Cervical polyp    Diverticulitis    Frequent headaches    GERD (gastroesophageal reflux disease)    Hyperlipidemia    Hypothyroidism    Macular retinal puckering, bilateral    Migraines    Post covid-19 condition, unspecified 02/26/2022   Pulmonary nodule 12/11/2020    Social History    Socioeconomic History   Marital status: Married    Spouse name: Not on file   Number of children: Not on file   Years of education: Not on file   Highest education level: 12th grade  Occupational History   Not on file  Tobacco Use   Smoking status: Former    Current packs/day: 0.00    Average packs/day: 1 pack/day for 6.0 years (6.0 ttl pk-yrs)    Types: Cigarettes    Start date: 10/19/1964    Quit date: 10/19/1970    Years since quitting: 53.7   Smokeless tobacco: Never  Vaping Use   Vaping status: Never Used  Substance and Sexual Activity   Alcohol use: Yes    Comment: rarely   Drug use: No   Sexual activity: Not on file  Other Topics Concern   Not on file  Social History Narrative   Married.   No children.   Retired. Once worked as an Manufacturing engineer.    Moved from [place] .   Social Drivers of Corporate investment banker Strain: Low Risk  (04/16/2024)   Overall Financial Resource Strain (CARDIA)    Difficulty of Paying Living Expenses: Not hard at all  Food Insecurity: No Food Insecurity (04/16/2024)   Hunger Vital Sign    Worried About Running Out of Food in the Last Year: Never  true    Ran Out of Food in the Last Year: Never true  Transportation Needs: No Transportation Needs (04/16/2024)   PRAPARE - Administrator, Civil Service (Medical): No    Lack of Transportation (Non-Medical): No  Physical Activity: Inactive (04/16/2024)   Exercise Vital Sign    Days of Exercise per Week: 0 days    Minutes of Exercise per Session: Not on file  Stress: Stress Concern Present (04/16/2024)   Grace Ortiz of Occupational Health - Occupational Stress Questionnaire    Feeling of Stress: To some extent  Social Connections: Unknown (04/16/2024)   Social Connection and Isolation Panel    Frequency of Communication with Friends and Family: Once a week    Frequency of Social Gatherings with Friends and Family: Once a week    Attends Religious Services: Never     Database administrator or Organizations: Not on file    Attends Banker Meetings: Not on file    Marital Status: Married  Intimate Partner Violence: Not At Risk (12/02/2023)   Humiliation, Afraid, Rape, and Kick questionnaire    Fear of Current or Ex-Partner: No    Emotionally Abused: No    Physically Abused: No    Sexually Abused: No    Past Surgical History:  Procedure Laterality Date   BLEPHAROPLASTY  2008, 2009   BREAST EXCISIONAL BIOPSY Left 05/1993   neg   BREAST EXCISIONAL BIOPSY Left 09/1993   neg   CATARACT EXTRACTION  2008   CERVICAL POLYPECTOMY  2014   COLONOSCOPY     COLONOSCOPY WITH PROPOFOL  N/A 12/25/2019   Procedure: COLONOSCOPY WITH PROPOFOL ;  Surgeon: Therisa Bi, MD;  Location: [facility] ENDOSCOPY;  Service: Gastroenterology;  Laterality: N/A;   COLONOSCOPY WITH PROPOFOL  N/A 01/04/2023   Procedure: COLONOSCOPY WITH PROPOFOL ;  Surgeon: Therisa Bi, MD;  Location: [facility] ENDOSCOPY;  Service: Gastroenterology;  Laterality: N/A;   DILATION AND CURETTAGE OF UTERUS     HYSTEROSCOPY  2014   UPPER ENDOSCOPY W/ ANTRODUODENAL MANOMETRY      Family History  Problem Relation Age of Onset   Breast cancer Maternal Grandmother 30   Cancer Maternal Grandmother        breast   Early death Maternal Grandmother    Hearing loss Mother    Heart disease Mother    Cancer Father        colon   Hearing loss Father    Diabetes Sister    Early death Paternal Grandfather     No Known Allergies  Current Outpatient Medications on File Prior to Visit  Medication Sig Dispense Refill   aspirin  EC 81 MG tablet Take 81 mg by mouth daily.     calcium -vitamin D (OSCAL WITH D) 500-5 MG-MCG tablet Take 1 tablet by mouth.     FLUoxetine  (PROZAC ) 40 MG capsule Take 1 capsule (40 mg total) by mouth daily. For depression 90 capsule 0   gabapentin  (NEURONTIN ) 100 MG capsule TAKE 2 TO 3 CAPSULES BY MOUTH AT BEDTIME FOR JOINT PAIN AND RESTLESS LEGS. 90 capsule 0   levothyroxine  (SYNTHROID )  50 MCG tablet TAKE 1 TABLET BY MOUTH ON SUNDAY AND MONDAY TAKE ON EMPTY STOMACH WITH NO OTHER MEDS FOR 30 MINUTES 24 tablet 2   levothyroxine  (SYNTHROID ) 75 MCG tablet Take 1 tablet by mouth every morning on an empty stomach with water only Tuesday through Saturday.  No food or other medications for 30 minutes. . 60 tablet 1   losartan  (COZAAR )  25 MG tablet TAKE 1 TABLET BY MOUTH DAILY FOR KIDNEY PROTECTION 90 tablet 2   omeprazole  (PRILOSEC) 20 MG capsule TAKE 1 CAPSULE BY MOUTH DAILY FOR HEARTBURN 90 capsule 2   polyethylene glycol (MIRALAX / GLYCOLAX) packet Take 17 g by mouth daily.      prochlorperazine  (COMPAZINE ) 10 MG tablet Take 1 tablet (10 mg total) by mouth every 8 (eight) hours as needed for nausea or vomiting. 30 tablet 0   rosuvastatin  (CRESTOR ) 20 MG tablet TAKE 1 TABLET BY MOUTH EVERY DAY 90 tablet 2   tiZANidine  (ZANAFLEX ) 4 MG tablet TAKE ONE TABLET BY MOUTH EVERY 8 HOURS AS NEEDED FOR MUSCLE SPASMS 30 tablet 0   traZODone  (DESYREL ) 50 MG tablet TAKE 1 TABLET BY MOUTH EVERY NIGHT AT BEDTIME AS NEEDED FOR SLEEP 90 tablet 2   No current facility-administered medications on file prior to visit.    BP 102/60   Pulse 68   Temp (!) 96.5 F (35.8 C) (Temporal)   Ht 4' 11 (1.499 m)   Wt 150 lb (68 kg)   SpO2 98%   BMI 30.30 kg/m  Objective:   Physical Exam Cardiovascular:     Rate and Rhythm: Normal rate and regular rhythm.  Pulmonary:     Effort: Pulmonary effort is normal.     Breath sounds: Normal breath sounds.  Musculoskeletal:     Cervical back: Neck supple.  Skin:    General: Skin is warm and dry.  Neurological:     Mental Status: She is alert and oriented to person, place, and time.  Psychiatric:        Mood and Affect: Mood normal.     Physical Exam        Assessment & Plan:  Mild episode of recurrent major depressive disorder (HCC) Assessment & Plan: Slight improvement.   We had a long discussion regarding options and whether to continue on  fluoxetine  40 mg vs increasing dose vs trying another option. We decided to continue her current regimen as her symptoms are situational based on her husbands actions for the day.  Continue fluoxetine  40 mg daily.  Continue Trazodone  50 mg HS.  Referral placed for therapy.   Orders: -     Ambulatory referral to Psychology  Insomnia, unspecified type Assessment & Plan: Improved.  Will move gabapentin  100 mg back to HS. Continue Trazodone  50 mg HS.     Assessment and Plan Assessment & Plan         Comer MARLA Gaskins, NP      History of Present Illness

## 2024-06-22 NOTE — Patient Instructions (Signed)
 You will either be contacted via phone regarding your referral to therapy, or you may receive a letter on your MyChart portal from our referral team with instructions for scheduling an appointment. Please let us know if you have not been contacted by anyone within two weeks.  It was a pleasure to see you today!

## 2024-06-23 DIAGNOSIS — Z636 Dependent relative needing care at home: Secondary | ICD-10-CM

## 2024-06-26 ENCOUNTER — Telehealth: Payer: Self-pay

## 2024-06-26 NOTE — Progress Notes (Signed)
 Complex Care Management Note  Care Guide Note 06/26/2024 Name: Makynzi Eastland MRN: [id number] DOB: 03/20/1946  Tashyra Adduci is a 78 y.o. year old female who sees Gretta Comer POUR, NP for primary care. I reached out to [name] by phone today to offer complex care management services.  Ms. Kenagy was given information about Complex Care Management services today including:   The Complex Care Management services include support from the care team which includes your Nurse Care Manager, Clinical Social Worker, or Pharmacist.  The Complex Care Management team is here to help remove barriers to the health concerns and goals most important to you. Complex Care Management services are voluntary, and the patient may decline or stop services at any time by request to their care team member.   Complex Care Management Consent Status: Patient agreed to services and verbal consent obtained.   Follow up plan:  Telephone appointment with complex care management team member scheduled for:  06/29/24 @ 11 AM  Encounter Outcome:  Patient Scheduled  Leotis Rase [facility], [facility] Guide  Direct Dial: [phone]  Fax [phone]

## 2024-06-29 ENCOUNTER — Other Ambulatory Visit: Payer: Self-pay | Admitting: Licensed Clinical Social Worker

## 2024-06-30 NOTE — Patient Outreach (Signed)
 Complex Care Management   Visit Note  06/29/2024  Name:  Grace Ortiz MRN: [id number] DOB: 11-20-1945  Situation: Referral received for Complex Care Management related to SDOH Barriers:  Stress Care guide for Dementia patient and LTC and senior living resources I obtained verbal consent from Caregiver.  Visit completed with Caregiver  on the phone  Background:   Past Medical History:  Diagnosis Date   Abdominal pain 02/05/2021   Acute UTI 08/02/2018   Arthritis    Bell's palsy    Cervical polyp    Diverticulitis    Frequent headaches    GERD (gastroesophageal reflux disease)    Hyperlipidemia    Hypothyroidism    Macular retinal puckering, bilateral    Migraines    Post covid-19 condition, unspecified 02/26/2022   Pulmonary nodule 12/11/2020    Assessment: Patients husband is in the early stage of dementia and wants information related to care and resources when dealing with a loved one with dementia. Patient wants also wants information on senior living and long term care resources. Patient also stated that she is scheduled to talk to an LCSW Mardene Ronde)  with [facility] but not from [facility]. When the SW does the follow call in a few weeks will discuss the LCSW and will see if she was to schedule an appointment with a VBCI LCSW.   SDOH Interventions    Flowsheet Row Patient Outreach Telephone from 06/29/2024 in [place] [facility]PART[facility] Office Visit from 06/22/2024 in [facility] [place] [facility] Office Visit from 05/23/2024 in [facility] Office Visit from 04/20/2024 in [facility] [place] [facility] Office Visit from 01/04/2024 in [facility] [place] [facility] Clinical Support from 12/02/2023 in [facility] [place] [facility] [place]  SDOH Interventions        Food Insecurity Interventions Intervention Not Indicated -- -- -- -- --  Housing Interventions Intervention Not Indicated -- --  -- -- Intervention Not Indicated  Transportation Interventions Intervention Not Indicated -- -- -- -- --  Utilities Interventions Intervention Not Indicated -- -- -- -- Intervention Not Indicated  Alcohol Usage Interventions -- -- -- -- -- Intervention Not Indicated (Score <7)  Depression Interventions/Treatment  -- Counseling Counseling Counseling Counseling --  Financial Strain Interventions Intervention Not Indicated -- -- -- -- --  Physical Activity Interventions -- -- -- -- -- Intervention Not Indicated  Stress Interventions -- -- -- -- -- Intervention Not Indicated  Health Literacy Interventions -- -- -- -- -- Intervention Not Indicated      Recommendation:   none  Follow Up Plan:   Telephone follow up appointment date/time:  07-27-2024 at 1:00pm  [name], PhD [facility], [facility] Social Worker Direct Dial: [phone]  Fax: [phone]

## 2024-06-30 NOTE — Patient Instructions (Signed)
 Visit Information  Thank you for taking time to visit with me today. Please don't hesitate to contact me if I can be of assistance to you before our next scheduled appointment.  Our next appointment is by telephone on 07/27/2024 at 1:00 pm Please call the care guide team at [phone] if you need to cancel or reschedule your appointment.   Following is a copy of your care plan:   Goals Addressed             This Visit's Progress    BSW VBCI Social Work Care Plan       Problems:   Dementia resources and LTC and senior living resources   CSW Clinical Goal(s):   Over the next 3 weeks the Patient will explore community resource options for unmet needs related to caregiver for dementia patients will follow up with Dementia resources and LTC resources as directed by Social Work.  Interventions:  SW will Pensions consultant living and LTC resources and Care guide for Dementia patients  Patient Goals/Self-Care Activities:  Review educational material on Dementia Patients and Senior living .  Plan:   Telephone follow up appointment with care management team member scheduled for:  07-27-2024 at 1:00 pm        Please call the Suicide and Crisis Lifeline: 988 go to [facility] Urgent [facility] [address], [place] [phone]) call 911 if you are experiencing a Mental Health or Behavioral Health Crisis or need someone to talk to.  Patient verbalizes understanding of instructions and care plan provided today and agrees to view in MyChart. Active MyChart status and patient understanding of how to access instructions and care plan via MyChart confirmed with patient.     [name], PhD [facility] Social Worker Direct Dial: [phone]  Fax: [phone]

## 2024-07-03 DIAGNOSIS — Z23 Encounter for immunization: Secondary | ICD-10-CM | POA: Diagnosis not present

## 2024-07-10 ENCOUNTER — Ambulatory Visit
Admission: RE | Admit: 2024-07-10 | Discharge: 2024-07-10 | Disposition: A | Discharge status: Home / Self Care | Source: Ambulatory Visit | Attending: Primary Care | Admitting: Primary Care

## 2024-07-10 DIAGNOSIS — Z1231 Encounter for screening mammogram for malignant neoplasm of breast: Secondary | ICD-10-CM | POA: Diagnosis not present

## 2024-07-12 ENCOUNTER — Ambulatory Visit: Payer: Self-pay | Admitting: Primary Care

## 2024-07-26 ENCOUNTER — Ambulatory Visit: Admitting: Clinical

## 2024-07-27 ENCOUNTER — Other Ambulatory Visit: Payer: Self-pay | Admitting: Licensed Clinical Social Worker

## 2024-07-27 NOTE — Patient Outreach (Signed)
 Complex Care Management   Visit Note  07/27/2024  Name:  Grace Ortiz MRN: [id number] DOB: Feb 02, 1946  Situation: Referral received for Complex Care Management related to SDOH Barriers:  Resources for Dementia  I obtained verbal consent from Caregiver.  Visit completed with Caregiver  on the phone  Background:   Past Medical History:  Diagnosis Date   Abdominal pain 02/05/2021   Acute UTI 08/02/2018   Arthritis    Bell's palsy    Cervical polyp    Diverticulitis    Frequent headaches    GERD (gastroesophageal reflux disease)    Hyperlipidemia    Hypothyroidism    Macular retinal puckering, bilateral    Migraines    Post covid-19 condition, unspecified 02/26/2022   Pulmonary nodule 12/11/2020    Assessment:Grace Ortiz stated that she has a therapist and will continue to use them and if any other services are needed, she will reach out. Declined to speak with a LCSW.   SDOH Interventions    Flowsheet Row Patient Outreach Telephone from 06/29/2024 in [place] [facility] DEPARTMENT Office Visit from 06/22/2024 in [facility] [place] [facility] Office Visit from 05/23/2024 in [facility] Office Visit from 04/20/2024 in [facility] [place] [facility] Office Visit from 01/04/2024 in [facility] Clinical Support from 12/02/2023 in [facility] [place] [facility] at [place]  SDOH Interventions        Food Insecurity Interventions Intervention Not Indicated -- -- -- -- --  Housing Interventions Intervention Not Indicated -- -- -- -- Intervention Not Indicated  Transportation Interventions Intervention Not Indicated -- -- -- -- --  Utilities Interventions Intervention Not Indicated -- -- -- -- Intervention Not Indicated  Alcohol Usage Interventions -- -- -- -- -- Intervention Not Indicated (Score <7)  Depression Interventions/Treatment  -- Counseling Counseling Counseling  Counseling --  Financial Strain Interventions Intervention Not Indicated -- -- -- -- --  Physical Activity Interventions -- -- -- -- -- Intervention Not Indicated  Stress Interventions -- -- -- -- -- Intervention Not Indicated  Health Literacy Interventions -- -- -- -- -- Intervention Not Indicated          Recommendation:   none  Follow Up Plan:   Closing From:  Complex Care Management  Grace CHARM [name], PhD [facility], [facility] Social Worker Direct Dial: [phone]  Fax: [phone]

## 2024-07-27 NOTE — Patient Instructions (Signed)

## 2024-08-20 ENCOUNTER — Other Ambulatory Visit: Payer: Self-pay | Admitting: Primary Care

## 2024-08-20 DIAGNOSIS — F330 Major depressive disorder, recurrent, mild: Secondary | ICD-10-CM

## 2024-08-21 ENCOUNTER — Other Ambulatory Visit: Payer: Self-pay | Admitting: Primary Care

## 2024-08-21 DIAGNOSIS — G8929 Other chronic pain: Secondary | ICD-10-CM

## 2024-08-21 DIAGNOSIS — R519 Headache, unspecified: Secondary | ICD-10-CM

## 2024-09-05 ENCOUNTER — Ambulatory Visit (INDEPENDENT_AMBULATORY_CARE_PROVIDER_SITE_OTHER): Admitting: Clinical

## 2024-09-05 DIAGNOSIS — F32A Depression, unspecified: Secondary | ICD-10-CM | POA: Diagnosis not present

## 2024-09-05 NOTE — Progress Notes (Signed)
 [facility] Counselor Initial Adult Exam  Name: Grace Ortiz Date: 09/05/2024 MRN: [id number] DOB: Jun 17, 1946 PCP: Gretta Comer POUR, NP  Time spent: 2:37pm - 3:25pm   Guardian/Payee:  NA    Paperwork requested: NA  Reason for Visit /Presenting Problem: Patient stated, my husband has developed dementia and its getting progressive, I didn't feel like I had support and didn't feel like I was handling it well. Patient reported husband's diagnosis of dementia is moderate.   Mental Status Exam: Appearance:   Neat and Well Groomed     Behavior:  Appropriate  Motor:  Normal  Speech/Language:   Clear and Coherent and Normal Rate  Affect:  Appropriate  Mood:  normal  Thought process:  normal  Thought content:    WNL  Sensory/Perceptual disturbances:    WNL  Orientation:  oriented to person, place, situation, day of week, month of year, and year  Attention:  Good  Concentration:  Good  Memory:  WNL  Fund of knowledge:   Good  Insight:    Good  Judgment:   Good  Impulse Control:  Good   Reported Symptoms:  Patient reported irritability at times. Patient stated, I've been depressed for a long long time, I just get kind of down (at times), fatigued when feeling depressed, loss of interest when feeling depressed. Patient reported depressive symptoms last for approximately one day.   Risk Assessment: Danger to Self:  No Patient denied current suicidal ideation, homicidal ideation, and symptoms of psychosis Self-injurious Behavior: No Danger to Others: No Duty to Warn:no Physical Aggression / Violence:No  Access to Firearms a concern: No current concern but reported access Gang Involvement:No  Patient / guardian was educated about steps to take if suicide or homicide risk level increases between visits: yes While future psychiatric events cannot be accurately predicted, the patient does not currently require acute inpatient psychiatric care and does not currently  meet [place]  involuntary commitment criteria.  Substance Abuse History: Current substance abuse: No   Patient reported a history of drinking (alcohol) now and again socially in the past. Patient reported a history of smoking tobacco until age [age] and reported patient started using tobacco in high school. Patient reported no tobacco use since 1972. Patient reported no current drug use. Patient reported a history of trying marijuana several times.   Past Psychiatric History:   Previous psychological history is significant for depression Outpatient Providers: none History of Psych Hospitalization: No  Psychological Testing: none   Abuse History:  Victim of: No., none   Report needed: No. Victim of Neglect:No. Perpetrator of none reported  Witness / Exposure to Domestic Violence: No   Protective Services Involvement: No  Witness to [employer] Violence:  No   Family History:  Family History  Problem Relation Age of Onset   Breast cancer Maternal Grandmother 30   Cancer Maternal Grandmother        breast   Early death Maternal Grandmother    Hearing loss Mother    Heart disease Mother    Cancer Father        colon   Hearing loss Father    Diabetes Sister    Early death Paternal Grandfather     Living situation: the patient lives with their family  Sexual Orientation: Straight  Relationship Status: married  Name of spouse / other: Marcene Larve If a parent, number of children / ages: none  Support Systems: niece's daughter, neighbor  Financial Stress:  No   Income/Employment/Disability:  retired  Financial Planner: No   Educational History: Education: soil scientist and high school  Religion/Sprituality/World View: Christian - unitarian  Any cultural differences that may affect / interfere with treatment:  not applicable   Recreation/Hobbies: reading  Stressors: Other: husband's health, patient stated, its hard to know where to go from here as it  relates to husband's care, relationship with niece    Strengths: Patient stated, I don't know, I just do it  Barriers:  none   Legal History: Pending legal issue / charges: The patient has no significant history of legal issues. History of legal issue / charges: none  Medical History/Surgical History: reviewed Past Medical History:  Diagnosis Date   Abdominal pain 02/05/2021   Acute UTI 08/02/2018   Arthritis    Bell's palsy    Cervical polyp    Diverticulitis    Frequent headaches    GERD (gastroesophageal reflux disease)    Hyperlipidemia    Hypothyroidism    Macular retinal puckering, bilateral    Migraines    Post covid-19 condition, unspecified 02/26/2022   Pulmonary nodule 12/11/2020    Past Surgical History:  Procedure Laterality Date   BLEPHAROPLASTY  2008, 2009   BREAST EXCISIONAL BIOPSY Left 05/1993   neg   BREAST EXCISIONAL BIOPSY Left 09/1993   neg   CATARACT EXTRACTION  2008   CERVICAL POLYPECTOMY  2014   COLONOSCOPY     COLONOSCOPY WITH PROPOFOL  N/A 12/25/2019   Procedure: COLONOSCOPY WITH PROPOFOL ;  Surgeon: Therisa Bi, MD;  Location: [facility] ENDOSCOPY;  Service: Gastroenterology;  Laterality: N/A;   COLONOSCOPY WITH PROPOFOL  N/A 01/04/2023   Procedure: COLONOSCOPY WITH PROPOFOL ;  Surgeon: Therisa Bi, MD;  Location: [facility] ENDOSCOPY;  Service: Gastroenterology;  Laterality: N/A;   DILATION AND CURETTAGE OF UTERUS     HYSTEROSCOPY  2014   UPPER ENDOSCOPY W/ ANTRODUODENAL MANOMETRY      Medications: Current Outpatient Medications  Medication Sig Dispense Refill   aspirin  EC 81 MG tablet Take 81 mg by mouth daily.     calcium -vitamin D (OSCAL WITH D) 500-5 MG-MCG tablet Take 1 tablet by mouth.     FLUoxetine  (PROZAC ) 40 MG capsule TAKE 1 CAPSULE (40 MG TOTAL) BY MOUTH DAILY. FOR DEPRESSION 90 capsule 0   gabapentin  (NEURONTIN ) 100 MG capsule TAKE 2 TO 3 CAPSULES BY MOUTH AT BEDTIME FOR JOINT PAIN AND RESTLESS LEGS. 90 capsule 0   levothyroxine   (SYNTHROID ) 50 MCG tablet TAKE 1 TABLET BY MOUTH ON SUNDAY AND MONDAY TAKE ON EMPTY STOMACH WITH NO OTHER MEDS FOR 30 MINUTES 24 tablet 2   levothyroxine  (SYNTHROID ) 75 MCG tablet Take 1 tablet by mouth every morning on an empty stomach with water only Tuesday through Saturday.  No food or other medications for 30 minutes. . 60 tablet 1   losartan  (COZAAR ) 25 MG tablet TAKE 1 TABLET BY MOUTH DAILY FOR KIDNEY PROTECTION 90 tablet 2   omeprazole  (PRILOSEC) 20 MG capsule TAKE 1 CAPSULE BY MOUTH DAILY FOR HEARTBURN 90 capsule 2   polyethylene glycol (MIRALAX / GLYCOLAX) packet Take 17 g by mouth daily.      prochlorperazine  (COMPAZINE ) 10 MG tablet TAKE 1 TABLET (10 MG TOTAL) BY MOUTH EVERY 8 (EIGHT) HOURS AS NEEDED FOR NAUSEA OR VOMITING. 30 tablet 0   rosuvastatin  (CRESTOR ) 20 MG tablet TAKE 1 TABLET BY MOUTH EVERY DAY 90 tablet 2   tiZANidine  (ZANAFLEX ) 4 MG tablet TAKE ONE TABLET BY MOUTH EVERY 8 HOURS AS NEEDED FOR MUSCLE SPASMS 30 tablet  0   traZODone  (DESYREL ) 50 MG tablet TAKE 1 TABLET BY MOUTH EVERY NIGHT AT BEDTIME AS NEEDED FOR SLEEP 90 tablet 2   No current facility-administered medications for this visit.    No Known Allergies - seasonal allergies  Diagnoses:  Depression, unspecified depression type  Plan of Care: Patient is a 78 year old female who presented for an initial assessment. Clinician conducted initial assessment in person from clinician's office at [facility]. Patient reported the following symptoms: irritability at times, stated I just get kind of down (at times), fatigued when feeling depressed, loss of interest when feeling depressed. Patient denied current suicidal ideation, homicidal ideation, and symptoms of psychosis. Patient reported no current substance use. Patient reported no history of outpatient or inpatient behavioral health treatment. Patient reported husband's health, uncertainty as it relates to husband's care, and relationship with niece are  current stressors. Patient reported patient's great niece and patient's neighbor are current supports. It is recommended patient participate in individual therapy biweekly. In addition, it is recommended patient may benefit from participation in a caregiver support group for additional support. Clinician will review recommendations and treatment plan with patient during follow up appointment. Treatment plan will be developed during follow up appointment.   Collaboration of Care: Primary Care Provider AEB Patient requested to complete a consent for PCP, Comer Gaskins, NP  Patient/Guardian was advised Release of Information must be obtained prior to any record release in order to collaborate their care with an outside provider. Patient/Guardian was advised if they have not already done so to contact [facility] to sign all necessary forms in order for us  to release information regarding their care.   Consent: Patient/Guardian gives written consent for treatment and assignment of benefits for services provided during this visit. Patient/Guardian expressed understanding and agreed to proceed.   Darice Seats, LCSW

## 2024-09-05 NOTE — Progress Notes (Signed)
   Darice Seats, LCSW

## 2024-09-08 ENCOUNTER — Other Ambulatory Visit: Payer: Self-pay | Admitting: Primary Care

## 2024-09-08 DIAGNOSIS — E038 Other specified hypothyroidism: Secondary | ICD-10-CM

## 2024-09-18 ENCOUNTER — Other Ambulatory Visit: Payer: Self-pay | Admitting: Primary Care

## 2024-09-18 DIAGNOSIS — G2581 Restless legs syndrome: Secondary | ICD-10-CM

## 2024-09-21 ENCOUNTER — Other Ambulatory Visit: Payer: Self-pay | Admitting: Primary Care

## 2024-09-21 DIAGNOSIS — N183 Chronic kidney disease, stage 3 unspecified: Secondary | ICD-10-CM

## 2024-10-03 ENCOUNTER — Other Ambulatory Visit: Payer: Self-pay | Admitting: Primary Care

## 2024-10-03 DIAGNOSIS — E038 Other specified hypothyroidism: Secondary | ICD-10-CM

## 2024-10-18 ENCOUNTER — Ambulatory Visit: Admitting: Clinical

## 2024-10-18 DIAGNOSIS — F32A Depression, unspecified: Secondary | ICD-10-CM

## 2024-10-18 NOTE — Progress Notes (Signed)
"    [facility] Counselor/Therapist Progress Note  Patient ID: Grace Ortiz, MRN: [id number],    Date: 10/18/2024  Time Spent: 11:32am - 12:33pm : 61 minutes   Treatment Type: Individual Therapy  Reported Symptoms: none reported  Mental Status Exam: Appearance:  Neat and Well Groomed     Behavior: Appropriate  Motor: Normal  Speech/Language:  Clear and Coherent and Normal Rate  Affect: Appropriate  Mood: normal  Thought process: normal  Thought content:   WNL  Sensory/Perceptual disturbances:   WNL  Orientation: oriented to person, place, time/date, and situation  Attention: Good  Concentration: Good  Memory: WNL  Fund of knowledge:  Good  Insight:   Good  Judgment:  Good  Impulse Control: Good   Risk Assessment: Danger to Self:  No Patient denied current suicidal ideation  Self-injurious Behavior: No  Danger to Others: No Patient denied current homicidal ideation Duty to Warn:no Physical Aggression / Violence:No  Access to Firearms a concern: No  Gang Involvement:No   Subjective:  Patient stated, a lot has changed, my husband died on the October 18, 2025. Patient reported patient's husband was in the hospital in ICU prior to husband's death. Patient reported patient's husband developed pneumonia and was placed on comfort care. Patient stated, I'm doing pretty good. Patient stated, I have a few moments in reference to the loss of patient's husband. Patient stated, actually pretty good in response to mood since last session. Patient stated, I needed the help with things that were changing with him (husband) in reference to patient's reason for therapy. Patient stated, I'm not sure I really need to continue in reference to therapy. Patient reported patient's great niece and niece's husband have been supportive, as well as, patient's neighbors. Patient reported patient will call if patient decides to resume therapy.   Interventions: Motivational  Interviewing and supportive therapy. Clinician conducted session in person at clinician's office at [facility]. Discussed changes since last session. Provided supportive therapy and active listening as patient discussed the loss of patient's husband. Clinician reviewed treatment recommendations. Discussed patient's thoughts/feelings regarding discontinuing therapy at this time.   Collaboration of Care: Other not required at this time  Diagnosis:Depression, unspecified depression type  Plan: Patient reported patient will call if patient decides to resume therapy. Goals/target dates will not be established at this time due to patient's decision to discontinue therapy.   Grace Seats, LCSW      "

## 2024-10-18 NOTE — Progress Notes (Signed)
   Darice Seats, LCSW

## 2024-11-11 ENCOUNTER — Other Ambulatory Visit: Payer: Self-pay | Admitting: Primary Care

## 2024-11-11 DIAGNOSIS — F330 Major depressive disorder, recurrent, mild: Secondary | ICD-10-CM

## 2024-12-04 ENCOUNTER — Ambulatory Visit: Payer: Medicare Other

## 2024-12-06 ENCOUNTER — Ambulatory Visit: Admitting: Primary Care

## 2025-01-01 ENCOUNTER — Ambulatory Visit: Admitting: Cardiovascular Disease

## 2025-01-02 ENCOUNTER — Ambulatory Visit
